# Patient Record
Sex: Female | Born: 1935 | Race: White | Hispanic: No | Smoking: Never smoker
Health system: Southern US, Community
[De-identification: ages and names within clinical notes are randomized; demographics above are authoritative.]

## PROBLEM LIST (undated history)

## (undated) DIAGNOSIS — G459 Transient cerebral ischemic attack, unspecified: Secondary | ICD-10-CM

## (undated) DIAGNOSIS — I499 Cardiac arrhythmia, unspecified: Secondary | ICD-10-CM

## (undated) DIAGNOSIS — A0471 Enterocolitis due to Clostridium difficile, recurrent: Secondary | ICD-10-CM

## (undated) DIAGNOSIS — I10 Essential (primary) hypertension: Secondary | ICD-10-CM

## (undated) DIAGNOSIS — I4891 Unspecified atrial fibrillation: Secondary | ICD-10-CM

## (undated) DIAGNOSIS — D696 Thrombocytopenia, unspecified: Secondary | ICD-10-CM

## (undated) DIAGNOSIS — I639 Cerebral infarction, unspecified: Secondary | ICD-10-CM

## (undated) DIAGNOSIS — E785 Hyperlipidemia, unspecified: Secondary | ICD-10-CM

## (undated) HISTORY — DX: Transient cerebral ischemic attack, unspecified: G45.9

## (undated) HISTORY — DX: Enterocolitis due to Clostridium difficile, recurrent: A04.71

## (undated) HISTORY — PX: OTHER SURGICAL HISTORY: SHX169

## (undated) HISTORY — DX: Thrombocytopenia, unspecified: D69.6

## (undated) HISTORY — DX: Essential (primary) hypertension: I10

## (undated) HISTORY — DX: Unspecified atrial fibrillation: I48.91

## (undated) HISTORY — DX: Cardiac arrhythmia, unspecified: I49.9

---

## 2001-07-20 ENCOUNTER — Ambulatory Visit (HOSPITAL_COMMUNITY): Admission: RE | Admit: 2001-07-20 | Discharge: 2001-07-20 | Payer: Self-pay | Admitting: Internal Medicine

## 2001-07-20 ENCOUNTER — Encounter: Payer: Self-pay | Admitting: Internal Medicine

## 2002-07-05 ENCOUNTER — Encounter: Payer: Self-pay | Admitting: Internal Medicine

## 2002-07-05 ENCOUNTER — Ambulatory Visit (HOSPITAL_COMMUNITY): Admission: RE | Admit: 2002-07-05 | Discharge: 2002-07-05 | Payer: Self-pay | Admitting: Internal Medicine

## 2004-07-31 ENCOUNTER — Ambulatory Visit (HOSPITAL_COMMUNITY): Admission: RE | Admit: 2004-07-31 | Discharge: 2004-07-31 | Payer: Self-pay | Admitting: Internal Medicine

## 2007-01-06 ENCOUNTER — Ambulatory Visit (HOSPITAL_COMMUNITY): Admission: RE | Admit: 2007-01-06 | Discharge: 2007-01-06 | Payer: Self-pay | Admitting: Ophthalmology

## 2007-05-29 ENCOUNTER — Ambulatory Visit (HOSPITAL_COMMUNITY): Admission: RE | Admit: 2007-05-29 | Discharge: 2007-05-29 | Payer: Self-pay | Admitting: Family Medicine

## 2010-07-27 ENCOUNTER — Inpatient Hospital Stay (HOSPITAL_COMMUNITY): Admission: EM | Admit: 2010-07-27 | Discharge: 2010-07-30 | Payer: Self-pay | Admitting: Emergency Medicine

## 2010-07-27 ENCOUNTER — Ambulatory Visit: Payer: Self-pay | Admitting: Orthopedic Surgery

## 2010-07-30 ENCOUNTER — Encounter (INDEPENDENT_AMBULATORY_CARE_PROVIDER_SITE_OTHER): Payer: Self-pay | Admitting: Internal Medicine

## 2010-07-30 ENCOUNTER — Encounter: Payer: Self-pay | Admitting: Orthopedic Surgery

## 2010-08-02 ENCOUNTER — Ambulatory Visit: Payer: Self-pay | Admitting: Orthopedic Surgery

## 2010-08-02 DIAGNOSIS — S52539A Colles' fracture of unspecified radius, initial encounter for closed fracture: Secondary | ICD-10-CM | POA: Insufficient documentation

## 2010-08-09 ENCOUNTER — Ambulatory Visit: Payer: Self-pay | Admitting: Orthopedic Surgery

## 2010-08-16 ENCOUNTER — Ambulatory Visit: Payer: Self-pay | Admitting: Orthopedic Surgery

## 2010-09-06 ENCOUNTER — Ambulatory Visit: Payer: Self-pay | Admitting: Orthopedic Surgery

## 2010-10-09 ENCOUNTER — Ambulatory Visit: Payer: Self-pay | Admitting: Orthopedic Surgery

## 2010-11-20 ENCOUNTER — Ambulatory Visit
Admission: RE | Admit: 2010-11-20 | Discharge: 2010-11-20 | Payer: Self-pay | Source: Home / Self Care | Attending: Orthopedic Surgery | Admitting: Orthopedic Surgery

## 2010-12-11 NOTE — Assessment & Plan Note (Signed)
Summary: 3 WK RE-CK LT WRIST/XRAY OOP/SEC HORIZ/CAF    Visit Type:  Follow-up Primary Provider:  Dr. Phillips Odor.  CC:  fx care left wrist.  History of Present Illness: DOI: 9.16.2011  Meds: Advil, ASA, HCTZ, Lasix, Tobramycin eye drops, Norco 5 for pain, alleyrgy med OTC, Tussin for cough.  Today is 3 week recheck xrays OOP.  Doing well.  This is a six-week followup status post fracture of the LEFT distal radius treated by short-arm cast  X-rays are obtained  3 views of the LEFT wrist  There is a fracture of the distal radius consistent with a Colles' type fracture.  Her radial inclination is good the length is neutral in terms of the ulna.  She does have slight dorsal tilt.  We accepted this position vs. surgery and the patient is aware.  Impression healing distal radius fracture with acceptable alignment  Clinically examination reveals tenderness at the wrist joint and somewhat over the distal radius as well.  She has pain with supination.  Recommend splint x4 weeks.  3 times a day exercises out of the splint  Return 4 weeks for x-ray.                Impression & Recommendations:  Problem # 1:  AFTERCARE HEALING TRAUMATIC FRACTURE LOWER ARM (ICD-V54.12) Assessment Improved  Orders: Post-Op Check (16109) Wrist x-ray complete, minimum 3 views (73110)  Problem # 2:  CLOSED COLLES FRACTURE (ICD-813.41) Assessment: Improved  Orders: Post-Op Check (60454)  Patient Instructions: 1)  Please schedule a follow-up appointment in 1 month. 2)  xrays wrist    Orders Added: 1)  Post-Op Check [99024] 2)  Wrist x-ray complete, minimum 3 views [73110]

## 2010-12-11 NOTE — Consult Note (Signed)
Summary: [facility] Consult FX  LT Wrist  Hosp Consult FX  LT Wrist   Imported By: Cammie Sickle 08/01/2010 15:33:28  _____________________________________________________________________  External Attachment:    Type:   Image     Comment:   External Document

## 2010-12-11 NOTE — Assessment & Plan Note (Signed)
Summary: 1 M RE-CK/XRAYS LT WRIST/SEC HORIZ/CAF   Visit Type:  Follow-up Primary Provider:  Dr. Phillips Odor.  CC:  fx care wrist.  History of Present Illness: DOI: 9.16.2011 left wrist fracture.    Meds: Advil, ASA, HCTZ, Lasix, Tobramycin eye drops, Norco 5 for pain as needed, alleyrgy med OTC, Tussin for cough.  Doing well.  Today is one month recheck with xrays after removable splint.  Doing pretty good.  X-rays today AP lateral oblique LEFT wrist show radial length has been maintained there is slight don't dorsal tilt at the articular surface.  Radial inclination angle is normal  Impression healed fracture with slight dorsal tilt  Patient is advised to continue with a home exercise program using a ball to squeeze working on range of motion and strength and come back in 6 weeks no x-ray needed                   Other Orders: Post-Op Check (16109) Wrist x-ray complete, minimum 3 views (60454)  Patient Instructions: 1)  Please schedule a follow-up appointment in 6 weeks   Orders Added: 1)  Post-Op Check [99024] 2)  Wrist x-ray complete, minimum 3 views [73110]

## 2010-12-11 NOTE — Assessment & Plan Note (Signed)
Summary: HOSP FOL/UP/LT WRIST FX,per Dr Harrison/?NEW XRAY/SEC HORIZ/CAF   Visit Type:  Follow-up  CC:  fu from hospital.  History of Present Illness: I saw Kathryn Marquez in the office today for a followup visit.  She is a 77 years old woman with the complaint of:  left wrist fracture after fall 07/27/10 after fainting.  Had closed reduction and splint on 07/27/10.  Today patient is here to be casted.  Meds: Advil, ASA, HCTZ, Lasix, Tobramycin eye drops, Norco 5 for pain.  Doing ok, pain med helps, her tail bone is hurting her more than her arm, cant sit on the table, too hard.  She has a donut cushion to sit on.  Followup from the hospital we will rex-ray the wrist.  3 views of the wrist show that the fracture has reasonable alignment with slight dorsal tilt.  The radial inclination angle is acceptable as is the radial length  Short arm cast applied x-ray in one week patient advised if the fracture moves out of position will require surgery     Impression & Recommendations:  Problem # 1:  CLOSED COLLES FRACTURE (ICD-813.41)  application short-arm cast  Orders: Post-Op Check (16109) Short Arm Cast Elbow to Finger (60454)  Patient Instructions: 1)  xray wrist in cast in 1 week

## 2010-12-11 NOTE — Assessment & Plan Note (Signed)
Summary: 1 WEEK XR WRIST IN CAST/SEC HOR/BSF   Visit Type:  Follow-up  CC:  recheck fx wrist post op.  History of Present Illness: I saw Katanya Pless in the office today for a followup visit.  She is a 77 years old woman with the complaint of:  left wrist fracture after fall 07/27/10 after fainting.  IN THE ER Had closed reduction and splint on 07/27/10. DAY # 13  Meds: Advil, ASA, HCTZ, Lasix, Tobramycin eye drops, Norco 5 for pain.  Today is one week recheck xrays in cast.  Doing well  Itching with pain medication but not with 1/2 a tablet   X-rays are to be done today to evaluate LEFT wrist fracture 13 days after initial injury and close reduction in the emergency room by the emergency room physician  X-rays show slight dorsal tilt normal to near normal radial inclination and radial length.  Impression healing distal radius fracture with slight dorsal tilt  Recommend x-ray out of plaster in one week.       Impression & Recommendations:  Problem # 1:  CLOSED COLLES FRACTURE (ICD-813.41) Assessment Comment Only  Orders: Post-Op Check (84132) Wrist x-ray complete, minimum 3 views (44010)  Patient Instructions: 1)  Please schedule a follow-up appointment in 1 week. 2)  XR OOP

## 2010-12-11 NOTE — Assessment & Plan Note (Signed)
Summary: 1 WK RE-CK/XRAY OOP LT WRIST/POST OP/SEC HORIZ/CAF   Visit Type:  Follow-up Primary Provider:  Dr. Phillips Odor.  CC:  fx care left wrist.  History of Present Illness: DOI: 9.16.2011  DAY # 20 Meds: Advil, ASA, HCTZ, Lasix, Tobramycin eye drops, Norco 5 for pain, alleyrgy med OTC, Tussin for cough.  Today is one week recheck xrays OOP.    Her alignment looks normal clinically  On x-ray 3 views were obtained and there is a slight dorsal tilt but her length is good and her radial inclination is good  Recommended short arm cast additional 3 weeks and x-rays out of plaster determine if we will need a splint or another cast.  3 views LEFT wrist  Distal radius fracture is seen.  There is slight dorsal tilt to the articular surface the length is normal and the radial inclination is normal  Impression healing LEFT distal radius fracture          Impression & Recommendations:  Problem # 1:  CLOSED COLLES FRACTURE (ICD-813.41) Assessment Improved  Orders: Post-Op Check (37628) Wrist x-ray complete, minimum 3 views (73110)  Problem # 2:  AFTERCARE HEALING TRAUMATIC FRACTURE LOWER ARM (ICD-V54.12) Assessment: Improved  Orders: Post-Op Check (31517) Wrist x-ray complete, minimum 3 views (73110)  Patient Instructions: 1)  XRAYS OOP IN 3 WEEKS

## 2010-12-13 NOTE — Assessment & Plan Note (Signed)
Summary: 6 WK RE-CK LT WRIST ROM/POST OP FX 07/27/10/SEC HORIZON/CAF   Visit Type:  Follow-up Primary Provider:  Dr. Phillips Odor.  CC:  recheck left wrist.  History of Present Illness: DOI: 9.16.2011 left wrist fracture.  Meds: Advil, ASA, HCTZ, Lasix, Tobramycin eye drops, Advil as needed, Tussin cough med as needed.  Today is 6 week recheck after hand  and wrist exercises.  Doing better.  Still has some stiffness of the wrist.  There is a fracture of the distal radius consistent with a Colles' type fracture.  Her radial inclination is good the length is neutral in terms of the ulna.  She does have slight dorsal tilt.  We accepted this position vs. surgery and the patient is aware.  The patient has functional range of motion at this time. No major deformity of the LEFT upper extremity. Some weakness in terms of grip and decreased supination, but otherwise is functioning very well with no pain at the fracture site. She is advised to continue her exercise program. Followup with [place] as needed              Impression & Recommendations:  Problem # 1:  AFTERCARE HEALING TRAUMATIC FRACTURE LOWER ARM (ICD-V54.12) Assessment Improved  Orders: Est. Patient Level II (04540)  Problem # 2:  CLOSED COLLES FRACTURE (ICD-813.41) Assessment: Improved  Orders: Est. Patient Level II (98119)  Patient Instructions: 1)  Please schedule a follow-up appointment as needed.   Orders Added: 1)  Est. Patient Level II [14782]

## 2011-01-24 LAB — GLUCOSE, CAPILLARY
Glucose-Capillary: 110 mg/dL — ABNORMAL HIGH (ref 70–99)
Glucose-Capillary: 132 mg/dL — ABNORMAL HIGH (ref 70–99)
Glucose-Capillary: 153 mg/dL — ABNORMAL HIGH (ref 70–99)

## 2011-01-24 LAB — POCT CARDIAC MARKERS
CKMB, poc: 1 ng/mL (ref 1.0–8.0)
Myoglobin, poc: 175 ng/mL (ref 12–200)
Troponin i, poc: <0.05 ng/mL (ref 0.00–0.09)

## 2011-01-24 LAB — HEPATIC FUNCTION PANEL
Albumin: 3.8 g/dL (ref 3.5–5.2)
Bilirubin, Direct: 0.1 mg/dL (ref 0.0–0.3)
Total Bilirubin: 0.6 mg/dL (ref 0.3–1.2)

## 2011-01-24 LAB — LIPID PANEL
Cholesterol: 192 mg/dL (ref 0–200)
Total CHOL/HDL Ratio: 3 RATIO
VLDL: 23 mg/dL (ref 0–40)

## 2011-01-24 LAB — URINE CULTURE: Colony Count: 1000

## 2011-01-24 LAB — URINALYSIS, ROUTINE W REFLEX MICROSCOPIC
Protein, ur: NEGATIVE mg/dL
Specific Gravity, Urine: 1.01 (ref 1.005–1.030)
Urobilinogen, UA: 0.2 mg/dL (ref 0.0–1.0)

## 2011-01-24 LAB — BASIC METABOLIC PANEL
Calcium: 9.3 mg/dL (ref 8.4–10.5)
GFR calc Af Amer: >60 mL/min (ref >60)
GFR calc non Af Amer: >60 mL/min (ref >60)
Glucose, Bld: 191 mg/dL — ABNORMAL HIGH (ref 70–99)
Potassium: 4 mEq/L (ref 3.5–5.1)
Sodium: 139 mEq/L (ref 135–145)

## 2011-01-24 LAB — CBC
HCT: 40.2 % (ref 36.0–46.0)
Hemoglobin: 13.7 g/dL (ref 12.0–15.0)
MCHC: 34.2 g/dL (ref 30.0–36.0)
RBC: 4.64 MIL/uL (ref 3.87–5.11)
WBC: 7.7 10*3/uL (ref 4.0–10.5)

## 2011-01-24 LAB — DIFFERENTIAL
Basophils Relative: 0 % (ref 0–1)
Lymphocytes Relative: 18 % (ref 12–46)
Monocytes Absolute: 0.6 10*3/uL (ref 0.1–1.0)
Monocytes Relative: 7 % (ref 3–12)
Neutro Abs: 5.7 10*3/uL (ref 1.7–7.7)
Neutrophils Relative %: 74 % (ref 43–77)

## 2011-01-24 LAB — HEMOGLOBIN A1C
Hgb A1c MFr Bld: 5.8 % — ABNORMAL HIGH (ref <5.7)
Mean Plasma Glucose: 120 mg/dL — ABNORMAL HIGH (ref <117)

## 2011-01-24 LAB — URINE MICROSCOPIC-ADD ON

## 2011-01-24 LAB — MAGNESIUM: Magnesium: 2 mg/dL (ref 1.5–2.5)

## 2012-04-28 ENCOUNTER — Emergency Department (HOSPITAL_COMMUNITY): Payer: Medicare Other

## 2012-04-28 ENCOUNTER — Encounter (HOSPITAL_COMMUNITY): Payer: Self-pay | Admitting: Emergency Medicine

## 2012-04-28 ENCOUNTER — Inpatient Hospital Stay (HOSPITAL_COMMUNITY)
Admission: EM | Admit: 2012-04-28 | Discharge: 2012-04-30 | DRG: 065 | Disposition: A | Discharge status: Home / Self Care | Payer: Medicare Other | Attending: Internal Medicine | Admitting: Internal Medicine

## 2012-04-28 DIAGNOSIS — Z7982 Long term (current) use of aspirin: Secondary | ICD-10-CM

## 2012-04-28 DIAGNOSIS — I4891 Unspecified atrial fibrillation: Secondary | ICD-10-CM | POA: Diagnosis present

## 2012-04-28 DIAGNOSIS — Z66 Do not resuscitate: Secondary | ICD-10-CM | POA: Diagnosis present

## 2012-04-28 DIAGNOSIS — I482 Chronic atrial fibrillation, unspecified: Secondary | ICD-10-CM | POA: Diagnosis present

## 2012-04-28 DIAGNOSIS — G459 Transient cerebral ischemic attack, unspecified: Secondary | ICD-10-CM | POA: Diagnosis present

## 2012-04-28 DIAGNOSIS — G8190 Hemiplegia, unspecified affecting unspecified side: Secondary | ICD-10-CM | POA: Diagnosis present

## 2012-04-28 DIAGNOSIS — E785 Hyperlipidemia, unspecified: Secondary | ICD-10-CM | POA: Diagnosis present

## 2012-04-28 DIAGNOSIS — I639 Cerebral infarction, unspecified: Secondary | ICD-10-CM

## 2012-04-28 DIAGNOSIS — S52539A Colles' fracture of unspecified radius, initial encounter for closed fracture: Secondary | ICD-10-CM

## 2012-04-28 DIAGNOSIS — I6340 Cerebral infarction due to embolism of unspecified cerebral artery: Principal | ICD-10-CM | POA: Diagnosis present

## 2012-04-28 DIAGNOSIS — Z79899 Other long term (current) drug therapy: Secondary | ICD-10-CM

## 2012-04-28 DIAGNOSIS — I10 Essential (primary) hypertension: Secondary | ICD-10-CM | POA: Diagnosis present

## 2012-04-28 DIAGNOSIS — Z8673 Personal history of transient ischemic attack (TIA), and cerebral infarction without residual deficits: Secondary | ICD-10-CM | POA: Diagnosis present

## 2012-04-28 DIAGNOSIS — I480 Paroxysmal atrial fibrillation: Secondary | ICD-10-CM

## 2012-04-28 HISTORY — DX: Hyperlipidemia, unspecified: E78.5

## 2012-04-28 HISTORY — DX: Cerebral infarction, unspecified: I63.9

## 2012-04-28 HISTORY — DX: Essential (primary) hypertension: I10

## 2012-04-28 LAB — URINALYSIS, ROUTINE W REFLEX MICROSCOPIC
Nitrite: NEGATIVE
Specific Gravity, Urine: <1.005 — ABNORMAL LOW (ref 1.005–1.030)
Urobilinogen, UA: 0.2 mg/dL (ref 0.0–1.0)

## 2012-04-28 LAB — URINE MICROSCOPIC-ADD ON

## 2012-04-28 MED ORDER — ASPIRIN 81 MG PO CHEW
324.0000 mg | CHEWABLE_TABLET | Freq: Once | ORAL | Status: AC
Start: 2012-04-28 — End: 2012-04-28
  Administered 2012-04-28: 324 mg via ORAL
  Filled 2012-04-28: qty 4

## 2012-04-28 MED ORDER — SODIUM CHLORIDE 0.9 % IV SOLN
INTRAVENOUS | Status: DC
  Administered 2012-04-29: 01:00:00 via INTRAVENOUS

## 2012-04-28 NOTE — ED Notes (Signed)
Patient complaining of right arm weakness since 0900 today. Patient does have weakness in right hand with grips. Son states she was slightly confused this morning.

## 2012-04-28 NOTE — ED Provider Notes (Addendum)
History   This chart was scribed for Shelda Jakes, MD by Shari Heritage. The patient was seen in room APA19/APA19. Patient's care was started at 2127.     CSN: [id number]  Arrival date & time 04/28/12  2127   First MD Initiated Contact with Patient 04/28/12 2148      Chief Complaint  Patient presents with  . Extremity Weakness    (Consider location/radiation/quality/duration/timing/severity/associated sxs/prior treatment) Patient is a 78 y.o. female presenting with extremity weakness. The history is provided by the patient and a relative. No language interpreter was used.  Extremity Weakness This is a new problem. The current episode started 12 to 24 hours ago. The problem occurs rarely. The problem has been gradually improving. Pertinent negatives include no chest pain, no abdominal pain, no headaches and no shortness of breath. Nothing relieves the symptoms. She has tried nothing for the symptoms.   Kathryn Marquez is a 78 y.o. female who presents to the Emergency Department complaining of numbness in right hand onset 16 hours ago when she woke up. Patient says she was unable to pick anything up with her right hand and it felt like "her arm wasn't there." Patient says that now she is experiencing numbness and tingling in her right fingers. Patient says that she hasn't had problems like this before. Patient said she didn't notice any symptoms yesterday or immediately before she went to bed. Patient says that her left arm and hand feel normal. Patient is also experiencing congestion and hoarseness of voice. Patient denies phasia, HA, fever, sore throat, cough, SOB, chest pain, abdominal pain, nausea, vomiting, diarrhea, dysuria, neck pain, back pain and rash. Patient is not on Coumadin, but takes Aspirin 1x/day. Patient with h/o HTN and irregular heart beat, a-fib.  PCP - Sherwood Gambler Past Medical History  Diagnosis Date  . Hypertension     History reviewed. No pertinent past surgical  history.  History reviewed. No pertinent family history.  History  Substance Use Topics  . Smoking status: Never Smoker   . Smokeless tobacco: Not on file  . Alcohol Use: No    OB History    Grav Para Term Preterm Abortions TAB SAB Ect Mult Living                  Review of Systems  Constitutional: Negative for fever and chills.  HENT: Positive for congestion and sore throat. Negative for neck pain.   Eyes: Negative for visual disturbance.  Respiratory: Negative for cough and shortness of breath.   Cardiovascular: Negative for chest pain.  Gastrointestinal: Negative for nausea, vomiting, abdominal pain and diarrhea.  Genitourinary: Negative for dysuria.  Musculoskeletal: Positive for extremity weakness. Negative for back pain.  Skin: Negative for rash.  Neurological: Positive for weakness and numbness. Negative for speech difficulty and headaches.   Patient is positive for numbness, weakness, tingling, congestion, hoarseness of voice.  Patient is negative for aphasia, HA, fever, sore throat, cough, SOB, chest pain, abdominal pain, nausea, vomiting, diarrhea, dysuria, neck pain, back pain and rash.  Allergies  Review of patient's allergies indicates no known allergies.  Home Medications   Current Outpatient Rx  Name Route Sig Dispense Refill  . AMLODIPINE BESYLATE 5 MG PO TABS Oral Take 5 mg by mouth every morning.    . ASPIRIN 81 MG PO CHEW Oral Chew 81 mg by mouth every morning.    Marland Kitchen HYDROCHLOROTHIAZIDE 25 MG PO TABS Oral Take 25 mg by mouth daily as needed. For fluid    .  IBUPROFEN 200 MG PO TABS Oral Take 200 mg by mouth 2 (two) times daily as needed.      BP 163/76  Pulse 83  Temp 98.9 F (37.2 C) (Oral)  Resp 18  Ht 5\' 5"  (1.651 m)  Wt 160 lb (72.576 kg)  BMI 26.63 kg/m2  SpO2 99%  Physical Exam  Nursing note and vitals reviewed. Constitutional: She is oriented to person, place, and time. She appears well-developed and well-nourished.  HENT:  Head:  Normocephalic and atraumatic.  Eyes: Conjunctivae and EOM are normal. Pupils are equal, round, and reactive to light.       Pupils are 3 mm. Sclera is clear.  Neck: Normal range of motion. Neck supple. Carotid bruit is not present.  Cardiovascular: Regular rhythm.  Tachycardia present.   No murmur heard. Pulses:      Radial pulses are 2+ on the right side.  Pulmonary/Chest: Effort normal and breath sounds normal. No respiratory distress. She has no wheezes. She has no rales.  Abdominal: Soft. Bowel sounds are normal.  Musculoskeletal: Normal range of motion. She exhibits edema (Non-pitting of ankles bilaterally.).       Right ankle: She exhibits swelling.       Left ankle: She exhibits swelling.  Lymphadenopathy:    She has no cervical adenopathy.  Neurological: She is alert and oriented to person, place, and time.       Weakness going thumb to middle finger and grip.  Skin: Skin is warm and dry.  Psychiatric: She has a normal mood and affect.    ED Course  Procedures (including critical care time) DIAGNOSTIC STUDIES: Oxygen Saturation is 97% on room air, adequate by my interpretation.    COORDINATION OF CARE: 10:30PM- Patient informed of current plan for treatment and evaluation and agrees with plan at this time. Patient has a-fib. Will order chest X-ray and CT scan of brain.  Labs Reviewed  URINALYSIS, ROUTINE W REFLEX MICROSCOPIC - Abnormal; Notable for the following:    Color, Urine STRAW (*)     Specific Gravity, Urine <1.005 (*)     Leukocytes, UA TRACE (*)     All other components within normal limits  CBC  DIFFERENTIAL  PROTIME-INR  URINE MICROSCOPIC-ADD ON  COMPREHENSIVE METABOLIC PANEL      Dg Chest 2 View  04/28/2012  *RADIOLOGY REPORT*  Clinical Data: Right arm weakness; confusion.  CHEST - 2 VIEW  Comparison: Chest radiograph performed 07/27/2010  Findings: There is partial left-sided volume loss, with associated postoperative changes as seen on the prior  study.  Adjacent opacity appears grossly stable from prior studies, without definite evidence of acute airspace consolidation.  The right lung remains clear.  No pleural effusion or pneumothorax is seen.  The lung apices are incompletely imaged on this study. Minimal nodularity at the left midlung zone appears stable from 2008.  The heart is borderline enlarged; the mediastinal contour is within normal limits.  No acute osseous abnormalities are seen.  IMPRESSION: Left-sided postoperative changes and associated chronic opacification are stable, without definite evidence for superimposed airspace consolidation.  Borderline cardiomegaly.  Original Report Authenticated By: Tonia Ghent, M.D.   Ct Head Wo Contrast  04/28/2012  *RADIOLOGY REPORT*  Clinical Data: Right arm weakness and mild confusion.  CT HEAD WITHOUT CONTRAST  Technique:  Contiguous axial images were obtained from the base of the skull through the vertex without contrast.  Comparison: CT of the head, and MRI/MRA of the brain performed 07/27/2010  Findings: There  is no evidence of acute infarction, mass lesion, or intra- or extra-axial hemorrhage on CT.  Prominence of the ventricles and sulci reflects mild cortical volume loss.  Mild cerebellar atrophy is noted.  Mild periventricular white matter change likely reflects small vessel ischemic microangiopathy.  The brainstem and fourth ventricle are within normal limits.  The basal ganglia are unremarkable in appearance.  The cerebral hemispheres demonstrate grossly normal gray-white differentiation. No mass effect or midline shift is seen.  There is no evidence of fracture; visualized osseous structures are unremarkable in appearance.  The orbits are within normal limits. Mild mucosal thickening is noted within the maxillary sinuses; the remaining paranasal sinuses and mastoid air cells are well-aerated. No significant soft tissue abnormalities are seen.  IMPRESSION:  1.  No acute intracranial pathology  seen on CT. 2.  Mild cortical volume loss and scattered small vessel ischemic microangiopathy. 3.  Mild mucosal thickening in the maxillary sinuses.  Original Report Authenticated By: Tonia Ghent, M.D.    Date: 04/29/2012  Rate: 129  Rhythm: atrial fibrillation  QRS Axis: normal  Intervals: normal  ST/T Wave abnormalities: nonspecific ST/T changes  Conduction Disutrbances:none  Narrative Interpretation:   Old EKG Reviewed: changes noted Changes noted but the last EKG was 07/27/2010 no evidence of atrial fibrillation on that.   1. CVA (cerebral infarction)   2. Atrial fibrillation with rapid ventricular response       MDM   Patient awoke at 6:30 in the morning with right hand weakness and numbness proved a little bit throughout the day but hasn't resolved family members also relates that she also seemed more confused and may have had some speech problems first thing in the morning. CT of the head is negative for bleed or infarct is still concerning for a stroke, CVA. Patient noted to be in atrial fibrillation with talking with family members sounds as if she may of been in atrial fib before she is not on Coumadin but she is on aspirin 81 mg chewable a day. Patient will require admission pneumonia admission to telemetry because of atrial fib initially her heart rate was 132 monitor for now heart rates in the 80s K. Alona Bene relative on and off EKG also showed atrophic. Urinalysis is negative complete blood count is negative electrolytes are still pending. Discussed with hospitalist they will admit to telemetry temporary admit orders completed for them. On exam here patient is still showing some problems with the right hand are some trouble with grip strength trouble with coordination of the hand no other focal neurological findings. Once head CT was negative patient was given a 324 mg of aspirin in the emergency department. Hospitalist aware that due to the atrial fib that the CVA may be related  to that patient does not have any carotid bruits. Will require echocardiogram of the heart to rule out any blood clots.    I personally performed the services described in this documentation, which was scribed in my presence. The recorded information has been reviewed and considered.     Shelda Jakes, MD 04/29/12 [id number]  Shelda Jakes, MD 04/29/12 [phone]

## 2012-04-29 ENCOUNTER — Encounter (HOSPITAL_COMMUNITY): Payer: Self-pay | Admitting: *Deleted

## 2012-04-29 ENCOUNTER — Observation Stay (HOSPITAL_COMMUNITY): Payer: Medicare Other

## 2012-04-29 DIAGNOSIS — I639 Cerebral infarction, unspecified: Secondary | ICD-10-CM

## 2012-04-29 DIAGNOSIS — I482 Chronic atrial fibrillation, unspecified: Secondary | ICD-10-CM | POA: Diagnosis present

## 2012-04-29 DIAGNOSIS — I10 Essential (primary) hypertension: Secondary | ICD-10-CM | POA: Diagnosis present

## 2012-04-29 DIAGNOSIS — I4891 Unspecified atrial fibrillation: Secondary | ICD-10-CM

## 2012-04-29 DIAGNOSIS — G459 Transient cerebral ischemic attack, unspecified: Secondary | ICD-10-CM

## 2012-04-29 DIAGNOSIS — Z8673 Personal history of transient ischemic attack (TIA), and cerebral infarction without residual deficits: Secondary | ICD-10-CM

## 2012-04-29 HISTORY — DX: Personal history of transient ischemic attack (TIA), and cerebral infarction without residual deficits: Z86.73

## 2012-04-29 HISTORY — DX: Cerebral infarction, unspecified: I63.9

## 2012-04-29 LAB — DIFFERENTIAL
Basophils Relative: 0 % (ref 0–1)
Eosinophils Absolute: 0.1 10*3/uL (ref 0.0–0.7)
Monocytes Absolute: 0.6 10*3/uL (ref 0.1–1.0)
Monocytes Relative: 9 % (ref 3–12)
Neutrophils Relative %: 65 % (ref 43–77)

## 2012-04-29 LAB — COMPREHENSIVE METABOLIC PANEL
Albumin: 3.7 g/dL (ref 3.5–5.2)
BUN: 13 mg/dL (ref 6–23)
Calcium: 9.7 mg/dL (ref 8.4–10.5)
Creatinine, Ser: 0.59 mg/dL (ref 0.50–1.10)
Potassium: 3.7 mEq/L (ref 3.5–5.1)
Total Protein: 6.8 g/dL (ref 6.0–8.3)

## 2012-04-29 LAB — CBC
Hemoglobin: 13.6 g/dL (ref 12.0–15.0)
MCH: 29.6 pg (ref 26.0–34.0)
MCHC: 33 g/dL (ref 30.0–36.0)
MCHC: 33.3 g/dL (ref 30.0–36.0)
Platelets: 183 10*3/uL (ref 150–400)
RDW: 13.1 % (ref 11.5–15.5)
WBC: 6.9 10*3/uL (ref 4.0–10.5)

## 2012-04-29 LAB — CREATININE, SERUM
Creatinine, Ser: 0.53 mg/dL (ref 0.50–1.10)
GFR calc Af Amer: >90 mL/min (ref >90)
GFR calc non Af Amer: 82 mL/min — ABNORMAL LOW (ref >90)

## 2012-04-29 LAB — PROTIME-INR
INR: 1 (ref 0.00–1.49)
Prothrombin Time: 13.4 seconds (ref 11.6–15.2)

## 2012-04-29 LAB — LIPID PANEL
HDL: 74 mg/dL (ref >39)
LDL Cholesterol: 103 mg/dL — ABNORMAL HIGH (ref 0–99)
Triglycerides: 109 mg/dL (ref <150)
VLDL: 22 mg/dL (ref 0–40)

## 2012-04-29 LAB — TSH: TSH: 1.947 u[IU]/mL (ref 0.350–4.500)

## 2012-04-29 MED ORDER — SODIUM CHLORIDE 0.9 % IV SOLN: INTRAVENOUS | Status: AC

## 2012-04-29 MED ORDER — IBUPROFEN 400 MG PO TABS: 200.0000 mg | ORAL_TABLET | Freq: Two times a day (BID) | ORAL | Status: DC | PRN

## 2012-04-29 MED ORDER — SODIUM CHLORIDE 0.9 % IJ SOLN
INTRAMUSCULAR | Status: AC
  Filled 2012-04-29: qty 3

## 2012-04-29 MED ORDER — ALPRAZOLAM 0.5 MG PO TABS
0.5000 mg | ORAL_TABLET | Freq: Once | ORAL | Status: AC | PRN
  Administered 2012-04-29: 0.5 mg via ORAL
  Filled 2012-04-29: qty 1

## 2012-04-29 MED ORDER — ENOXAPARIN SODIUM 40 MG/0.4ML ~~LOC~~ SOLN
40.0000 mg | SUBCUTANEOUS | Status: DC
  Administered 2012-04-29 – 2012-04-30 (×2): 40 mg via SUBCUTANEOUS
  Filled 2012-04-29 (×2): qty 0.4

## 2012-04-29 MED ORDER — ACETAMINOPHEN 325 MG PO TABS: 650.0000 mg | ORAL_TABLET | ORAL | Status: DC | PRN

## 2012-04-29 MED ORDER — POTASSIUM CHLORIDE CRYS ER 20 MEQ PO TBCR
20.0000 meq | EXTENDED_RELEASE_TABLET | Freq: Two times a day (BID) | ORAL | Status: AC
  Administered 2012-04-29 (×2): 20 meq via ORAL
  Filled 2012-04-29 (×2): qty 1

## 2012-04-29 MED ORDER — ASPIRIN 325 MG PO TABS
325.0000 mg | ORAL_TABLET | Freq: Every day | ORAL | Status: DC
  Administered 2012-04-29 – 2012-04-30 (×2): 325 mg via ORAL
  Filled 2012-04-29 (×2): qty 1

## 2012-04-29 MED ORDER — HYDROCHLOROTHIAZIDE 25 MG PO TABS
25.0000 mg | ORAL_TABLET | Freq: Every day | ORAL | Status: DC
  Administered 2012-04-29: 25 mg via ORAL
  Filled 2012-04-29: qty 1

## 2012-04-29 MED ORDER — METOPROLOL SUCCINATE ER 25 MG PO TB24
25.0000 mg | ORAL_TABLET | Freq: Every day | ORAL | Status: DC
  Administered 2012-04-29 – 2012-04-30 (×2): 25 mg via ORAL
  Filled 2012-04-29 (×2): qty 1

## 2012-04-29 MED ORDER — SODIUM CHLORIDE 0.9 % IV SOLN: 250.0000 mL | INTRAVENOUS | Status: DC | PRN

## 2012-04-29 MED ORDER — SODIUM CHLORIDE 0.9 % IJ SOLN
INTRAMUSCULAR | Status: AC
  Administered 2012-04-29: 05:00:00
  Filled 2012-04-29: qty 3

## 2012-04-29 NOTE — Progress Notes (Signed)
Occupational Therapy Evaluation Patient Details Name: DESHAWNDA ACREY MRN: [id number] DOB: February 01, 1921 Today's Date: 04/29/2012 Time: 8295-6213 OT Time Calculation (min): 30 min  OT Assessment / Plan / Recommendation Clinical Impression  A: 78 y/o female who presents with weakness, decreased coordination and sensation loss in her RUE. These deficits are affecting her ability to perform novel tasks, including grooming with her dominant RUE. Chart review states possible TIA but waiting on MRI to confirm. OT referred for evaluation and treatment.  Pt could benefit from continued OT services to address weakness, decreased coordination and sensation. Recommending outpatient OT upon discharge for continued OT services addressing those deficits.      OT Assessment  Patient needs continued OT Services    Follow Up Recommendations  Outpatient OT    Barriers to Discharge None          Frequency  Min 3X/week           ADL  Grooming: Performed Where Assessed - Grooming: Supine, head of bed up ADL Comments: Pt washed her face and brushed her teeth while supine in bed with HOB up. She was able to use RUE as gross assist to LUE while washing face. She initially attempted to brush her teeth with the toothbrush in her right hand but had difficulty with coordination of movement and switched toothbrush to her left hand. to complete the task.    OT Diagnosis: Other (comment) (Decreased strength & fine motor coordination, dominant side)  OT Problem List: Decreased strength;Decreased coordination;Impaired UE functional use;Decreased activity tolerance OT Treatment Interventions: Self-care/ADL training;Neuromuscular education;Therapeutic activities;Therapeutic exercise;Patient/family education   OT Goals Acute Rehab OT Goals OT Goal Formulation: With patient/family ADL Goals Pt Will Perform Grooming: Independently (using RUE) ADL Goal: Grooming - Progress: Goal set today Miscellaneous OT  Goals Miscellaneous OT Goal #1: Patient will perform all aspects of toileting (transfer, clothing maintenance and hygiene) independently. Miscellaneous OT Goal #2: Patient will improve RUE strength to 5/5 to increase independence with cooking and baking.  Miscellaneous OT Goal #3: Patient will demonstrate good coordination of RUE during grooming tasks.  Visit Information  Last OT Received On: 04/29/12    Subjective Data  Subjective: S: My right arm is getting better, but it still isnt doing as well as my left arm.  Patient Stated Goal: To get her L arm back to 100%.   Prior Functioning  Home Living Lives With: Alone Available Help at Discharge: Family Type of Home: House Home Access: Stairs to enter [employer] of Steps: 1 step Entrance Stairs-Rails: None Home Layout: Two level;Laundry or work area in basement Alternate Level Stairs-Rails: None Bathroom Shower/Tub: Associate Professor: Yes How Accessible: Accessible via walker Home Adaptive Equipment: Straight cane (has first alert) Prior Function Level of Independence: Independent Able to Take Stairs?: Yes Driving: Yes Vocation: Retired Comments: enjoys cooking for her family and is quite independent with all daily activities.  She recently renewed her drivers license and was able to do so without any restrictions. Communication Communication: No difficulties Dominant Hand: Right    Cognition  Overall Cognitive Status: Appears within functional limits for tasks assessed/performed Arousal/Alertness: Awake/alert Orientation Level: Appears intact for tasks assessed Behavior During Session: [facility] for tasks performed    Extremity/Trunk Assessment Right Upper Extremity Assessment RUE ROM/Strength/Tone: Deficits RUE ROM/Strength/Tone Deficits: RUE ROM WFL. RUE strength grade 4/5 in all movements.  RUE Sensation: Deficits RUE Sensation Deficits: Pt reports decreased  sensation in RUE compared to L when testing  light touch. Pt stated the touch on her RUE felt much duller/softer. RUE Coordination: Deficits RUE Coordination Deficits: Gross motor coordination in tact. Fine motor coordination impaired. Patient was unable to coordinate movements to perform the finger tips to thumb exercise. She required increase time and mod pa to touch her index, middle, third and small finger to her thumb. Patient was able to independently and accurately complete this activity with her LUE.    Mobility Bed Mobility Bed Mobility: Supine to Sit Supine to Sit: 7: Independent Details for Bed Mobility Assistance: Patient performed supine to sit (with HOB raised approx 45 degrees) independently.      Balance Balance Balance Assessed: No  End of Session OT - End of Session Activity Tolerance: Patient tolerated treatment well Patient left: in bed;with call bell/phone within reach;with family/visitor present;Other (comment) (being transferred to MRI)   Laverta Baltimore, OTS Occupational Therapy Student Note reviewed by clinical instructor and accurately reflects treatment session.  Shirlean Mylar, OTR/L  04/29/2012, 10:30 AM

## 2012-04-29 NOTE — Plan of Care (Signed)
Problem: Phase I Progression Outcomes Goal: OOB as tolerated unless otherwise ordered Outcome: Progressing Up to [facility] with assistance

## 2012-04-29 NOTE — Evaluation (Signed)
Physical Therapy Evaluation Patient Details Name: Kathryn Marquez MRN: [id number] DOB: 1921-06-12 Today's Date: 04/29/2012 Time: 7846-9629 PT Time Calculation (min): 36 min  PT Assessment / Plan / Recommendation Clinical Impression  Pt is very fatigued from activities of the day, but pleasant and cooperative.  Her primary dysfunction from stroke seems to be limited to her RUE, for which OT evaluated earlier.  She has DJD of the R hip and limitation of high level balance acuity (for which this is probably her norm).  She has been ambulating with a cane PTA, but I would like to see her use a walker, at least for now.  Her son is going to check their home to see if they already have a walker for her to use.  I would recommend HHPT at d/c for a ahome safety eval.              PT Assessment  All further PT needs can be met in the next venue of care    Follow Up Recommendations  Home health PT    Barriers to Discharge        lEquipment Recommendations  Rolling walker with 5" wheels (unless family already has a waker at home)    Recommendations for Other Services     Frequency      Precautions / Restrictions Precautions Precautions: Fall Restrictions Weight Bearing Restrictions: No   Pertinent Vitals/Pain       Mobility  Bed Mobility Bed Mobility: Supine to Sit;Sit to Supine Supine to Sit: 7: Independent Sit to Supine: 7: Independent Transfers Transfers: Sit to Stand;Stand to Sit Sit to Stand: 6: Modified independent (Device/Increase time);With upper extremity assist Stand to Sit: 6: Modified independent (Device/Increase time);With upper extremity assist Ambulation/Gait Ambulation/Gait Assistance: 5: Supervision Ambulation Distance (Feet): 150 Feet Assistive device: Rolling walker;Straight cane Ambulation/Gait Assistance Details: reaches for my hand when using a cane...she is more stable with a walker as she has DJD of R hip with hip in external rotation during gait and some  antalgia Gait Pattern: Step-through pattern;Trunk flexed Gait velocity: WNL Stairs: No Wheelchair Mobility Wheelchair Mobility: No    Exercises     PT Diagnosis: Difficulty walking  PT Problem List: Other (comment) (uncertainty of home safety) PT Treatment Interventions:     PT Goals    Visit Information  Last PT Received On: 04/29/12    Subjective Data  Subjective: feels "tired" Patient Stated Goal: return home   Prior Functioning       Cognition       Extremity/Trunk Assessment Right Lower Extremity Assessment RLE ROM/Strength/Tone: Within functional levels RLE Sensation: WFL - Light Touch;WFL - Proprioception RLE Coordination: WFL - gross motor Left Lower Extremity Assessment LLE ROM/Strength/Tone: Within functional levels LLE Sensation: WFL - Light Touch;WFL - Proprioception LLE Coordination: WFL - gross motor Trunk Assessment Trunk Assessment: Kyphotic   Balance Balance Balance Assessed: Yes Static Sitting Balance Static Sitting - Level of Assistance: 7: Independent Dynamic Sitting Balance Dynamic Sitting - Level of Assistance: 7: Independent Static Standing Balance Static Standing - Level of Assistance: 4: Min assist (unable to do higher level balance activities)  End of Session PT - End of Session Equipment Utilized During Treatment: Gait belt Activity Tolerance: Patient tolerated treatment well Patient left: in bed;with call bell/phone within reach;with bed alarm set;with family/visitor present Nurse Communication: Mobility status   Konrad Penta 04/29/2012, 4:15 PM

## 2012-04-29 NOTE — Care Management Note (Signed)
    Page 1 of 1   04/30/2012     2:39:38 PM   CARE MANAGEMENT NOTE 04/30/2012  Patient:  Kathryn Marquez, Kathryn Marquez   Account Number:  [id number]  Date Initiated:  04/29/2012  Documentation initiated by:  Rosemary Holms  Subjective/Objective Assessment:   Pt admitted from home alone with CVA/TIA symptoms.     Action/Plan:   Spoke to pt and daughter at bedside. Previously client of [facility].Marland Kitchen PCP is [place]. Will follow to assess DC needs   Anticipated DC Date:  04/30/2012   Anticipated DC Plan:  HOME W HOME HEALTH SERVICES      DC Planning Services  CM consult      Choice offered to / List presented to:     DME arranged  WALKER - Lavone Nian      DME agency  [facility]     [facility] arranged  HH-3 OT  HH-2 PT      [facility] agency  [facility]   Status of service:  Completed, signed off Medicare Important Message given?   (If response is "NO", the following Medicare IM given date fields will be blank) Date Medicare IM given:   Date Additional Medicare IM given:    Discharge Disposition:  HOME W HOME HEALTH SERVICES  Per UR Regulation:    If discussed at Long Length of Stay Meetings, dates discussed:    Comments:  04/30/12 1100 Eri Mcevers Leanord Hawking RN BSN CM [facility] notified for [facility] PT and OT. [facility] will also deliver rolling walker to home.  04/29/12 1400 Zanyiah Posten Leanord Hawking RN BSN CM

## 2012-04-29 NOTE — Progress Notes (Signed)
*  PRELIMINARY RESULTS* Echocardiogram 2D Echocardiogram has been performed.  Kathryn Marquez 04/29/2012, 2:31 PM

## 2012-04-29 NOTE — Progress Notes (Signed)
Occupational Therapy Evaluation Patient Details Name: Kathryn Marquez MRN: [id number] DOB: Dec 10, 1920 Today's Date: 04/29/2012 Time: 4098-1191 OT Time Calculation (min): 30 min  OT Assessment / Plan / Recommendation Clinical Impression  A: 78 y/o female who presents with weakness, decreased coordination and sensation loss in her RUE. These deficits are affecting her ability to perform novel tasks, including grooming with her dominant RUE. Chart review states possible TIA but waiting on MRI to confirm. OT referred for evaluation and treatment.  Pt could benefit from continued OT services to address weakness, decreased coordination and sensation. Recommending outpatient OT upon discharge for continued OT services addressing those deficits.      OT Assessment  Patient needs continued OT Services    Follow Up Recommendations  Outpatient OT    Barriers to Discharge None          Frequency  Min 3X/week           ADL  Grooming: Performed Where Assessed - Grooming: Supine, head of bed up ADL Comments: Pt washed her face and brushed her teeth while supine in bed with HOB up. She was able to use RUE as gross assist to LUE while washing face. She initially attempted to brush her teeth with the toothbrush in her right hand but had difficulty with coordination of movement and switched toothbrush to her left hand. to complete the task.    OT Diagnosis: Other (comment) (Decreased strength & fine motor coordination, dominant side)  OT Problem List: Decreased strength;Decreased coordination;Impaired UE functional use;Decreased activity tolerance OT Treatment Interventions: Self-care/ADL training;Neuromuscular education;Therapeutic activities;Therapeutic exercise;Patient/family education   OT Goals Acute Rehab OT Goals OT Goal Formulation: With patient/family ADL Goals Pt Will Perform Grooming: Independently (using RUE) ADL Goal: Grooming - Progress: Goal set today Miscellaneous OT  Goals Miscellaneous OT Goal #1: Patient will perform all aspects of toileting (transfer, clothing maintenance and hygiene) independently. Miscellaneous OT Goal #2: Patient will improve RUE strength to 5/5 to increase independence with cooking and baking.  Miscellaneous OT Goal #3: Patient will demonstrate good coordination of RUE during grooming tasks.  Visit Information  Last OT Received On: 04/29/12    Subjective Data  Subjective: S: My right arm is getting better, but it still isnt doing as well as my left arm.  Patient Stated Goal: To get her L arm back to 100%.   Prior Functioning  Home Living Lives With: Alone Available Help at Discharge: Family Type of Home: House Home Access: Stairs to enter [employer] of Steps: 1 step Entrance Stairs-Rails: None Home Layout: Two level;Laundry or work area in basement Alternate Level Stairs-Rails: None Bathroom Shower/Tub: Associate Professor: Yes How Accessible: Accessible via walker Home Adaptive Equipment: Straight cane (has first alert) Prior Function Level of Independence: Independent Able to Take Stairs?: Yes Driving: Yes Vocation: Retired Comments: enjoys cooking for her family and is quite independent with all daily activities.  She recently renewed her drivers license and was able to do so without any restrictions. Communication Communication: No difficulties Dominant Hand: Right    Cognition  Overall Cognitive Status: Appears within functional limits for tasks assessed/performed Arousal/Alertness: Awake/alert Orientation Level: Appears intact for tasks assessed Behavior During Session: [facility] for tasks performed    Extremity/Trunk Assessment Right Upper Extremity Assessment RUE ROM/Strength/Tone: Deficits RUE ROM/Strength/Tone Deficits: RUE ROM WFL. RUE strength grade 4/5 in all movements.  RUE Sensation: Deficits RUE Sensation Deficits: Pt reports decreased  sensation in RUE compared to L when testing  light touch. Pt stated the touch on her RUE felt much duller/softer. RUE Coordination: Deficits RUE Coordination Deficits: Gross motor coordination in tact. Fine motor coordination impaired. Patient was unable to coordinate movements to perform the finger tips to thumb exercise. She required increase time and mod pa to touch her index, middle, third and small finger to her thumb. Patient was able to independently and accurately complete this activity with her LUE.    Mobility Bed Mobility Bed Mobility: Supine to Sit Supine to Sit: 7: Independent Details for Bed Mobility Assistance: Patient performed supine to sit (with HOB raised approx 45 degrees) independently.      Balance Balance Balance Assessed: No  End of Session OT - End of Session Activity Tolerance: Patient tolerated treatment well Patient left: in bed;with call bell/phone within reach;with family/visitor present;Other (comment) (being transferred to MRI)   Laverta Baltimore, OTS Occupational Therapy Student  04/29/2012, 10:30 AM

## 2012-04-29 NOTE — Progress Notes (Signed)
The MRI of the patient's brain revealed several scattered small acute infarctions involving the left hemisphere and a small amount of hemorrhage associated with the left parietal lobe infarct. I discussed the findings with neurologist, Dr. Gerilyn Pilgrim. He recommended continuing aspirin but to defer starting Coumadin for 2-4 weeks to avoid hemorrhagic transformation. The patient and her family were informed of the findings and the recommendations.

## 2012-04-29 NOTE — Progress Notes (Signed)
UR Chart Review Completed  

## 2012-04-29 NOTE — Progress Notes (Signed)
The patient is an 78 year old woman with a history significant for chronic atrial fibrillation and hypertension, who was admitted early this morning for right hand and arm weakness and numbness. She was briefly seen. Her chart, vital signs, laboratory studies were reviewed. We'll await the results of the MRI, 2-D echocardiogram, and carotid ultrasound. We'll continue aspirin therapy for now. We'll start Coumadin if the MRI confirms a stroke. The patient is in agreement with this. Consider discharging her later on this afternoon or early tomorrow morning depending on the physical therapist's recommendation and the results of the studies ordered. We'll decrease her IV fluids as she is hypertensive and she does not appear to be volume depleted.

## 2012-04-29 NOTE — Patient Instructions (Signed)
Provided patient with HEP including fine motor activities, theraputty, and RUE ROM/strengthening exercises. Had patient demonstrate understanding of HEP by performing a few of the exercises. Limited in education time as patient was scheduled for MRI.

## 2012-04-29 NOTE — H&P (Signed)
[facility] History and Physical  MAFALDA MCGINNISS JXB:[id number] DOB: 08/11/1921 DOA: 04/28/2012  Referring physician: EDP PCP: No primary provider on file.   Chief Complaint: Right Arm Numbness and weakness  HPI:  The patient is an 78 year old woman with a history of hypertension and atrial fibrillation who presented to the emergency department complaining of right hand and lower arm weakness that she noticed when she awoke at 6:30 the morning of admission. She was unable to pick up the phone and dial her family members without significant difficulty. Once her family members arrived they noticed that she was having trouble speaking and did not notice a right facial droop and she was mildly confused. The symptoms began to improve significantly at home but she decided to come in for evaluation when her right hand and lower arm numbness persisted, she is right-handed and was having difficulty with coordination. On arrival to ED was in A-Fib with RVR 130 hemodynamically stable, has never been on anticoagulation other than aspirin. Does not follow with a regular PCP. Denies any chest pain, SOB, or diaphoresis. No fevers or recent illness.   Review of Systems:  Review of Systems  Constitutional: Negative for fever, chills, weight loss, malaise/fatigue and diaphoresis.  HENT: Negative for hearing loss and congestion.   Eyes: Negative for blurred vision and double vision.  Respiratory: Negative for cough and shortness of breath.   Cardiovascular: Positive for leg swelling. Negative for chest pain, palpitations and orthopnea.  Gastrointestinal: Negative for nausea, vomiting and abdominal pain.  Genitourinary: Negative for dysuria.  Musculoskeletal: Negative for joint pain.  Skin: Negative for itching and rash.  Neurological: Positive for tingling and weakness. Negative for dizziness, tremors and headaches.     Past Medical History  Diagnosis Date  . Hypertension    History reviewed. No  pertinent past surgical history. Social History:  reports that she has never smoked. She does not have any smokeless tobacco history on file. She reports that she does not drink alcohol or use illicit drugs.  No Known Allergies  Family History  Problem Relation Age of Onset  . Heart attack Mother   . Stroke Other   . Leukemia Sister   . Cancer Brother   . Cancer Other     Prior to Admission medications   Medication Sig Start Date End Date Taking? Authorizing Provider  amLODipine (NORVASC) 5 MG tablet Take 5 mg by mouth every morning.   Yes Historical Provider, MD  aspirin 81 MG chewable tablet Chew 81 mg by mouth every morning.   Yes Historical Provider, MD  hydrochlorothiazide (HYDRODIURIL) 25 MG tablet Take 25 mg by mouth daily as needed. For fluid   Yes Historical Provider, MD  ibuprofen (ADVIL,MOTRIN) 200 MG tablet Take 200 mg by mouth 2 (two) times daily as needed.   Yes Historical Provider, MD   Physical Exam: Filed Vitals:   04/28/12 2135 04/29/12 0007 04/29/12 0115 04/29/12 0220  BP: 180/114 163/76  155/80  Pulse: 132 83  79  Temp: 98.9 F (37.2 C)  98.4 F (36.9 C) 98.2 F (36.8 C)  TempSrc: Oral   Oral  Resp: 18 18  20   Height:      Weight:      SpO2: 97% 99%  95%     General:  Healthy appearing 78 year old, NAD, pleasant and conversant  Eyes: Visual fields in tact, PERRL  ENT: MMM, trachea midline, no facial droop  Neck: No bruits, no JVD  Cardiovascular: Caren Hazy  Respiratory: CTAB  Abdomen: Soft, NT  Skin: no rases or lesions  Musculoskeletal: Changes of OA  Psychiatric: AOX3, normal affect  Neurologic: Right Hand and lower forearm loss of sensation to light touch, mild weakness, difficulty with coordination, loss of fine motor skills in right hand.   DTR normal, all other extremities with 5/5 strength and normal sensation, no facial droop, no dysphagia or speech difficulty  Labs on Admission:  Basic Metabolic Panel:  Lab 04/28/12 4540  NA  144  K 3.7  CL 105  CO2 27  GLUCOSE 126*  BUN 13  CREATININE 0.59  CALCIUM 9.7  MG --  PHOS --   Liver Function Tests:  Lab 04/28/12 2335  AST 20  ALT 19  ALKPHOS 72  BILITOT 0.2*  PROT 6.8  ALBUMIN 3.7   No results found for this basename: LIPASE:5,AMYLASE:5 in the last 168 hours No results found for this basename: AMMONIA:5 in the last 168 hours CBC:  Lab 04/28/12 2335  WBC 7.2  NEUTROABS 4.7  HGB 13.6  HCT 40.9  MCV 88.9  PLT 189   Cardiac Enzymes: No results found for this basename: CKTOTAL:5,CKMB:5,CKMBINDEX:5,TROPONINI:5 in the last 168 hours BNP: No components found with this basename: POCBNP:5 CBG: No results found for this basename: GLUCAP:5 in the last 168 hours  Radiological Exams on Admission: Dg Chest 2 View  04/28/2012  *RADIOLOGY REPORT*  Clinical Data: Right arm weakness; confusion.  CHEST - 2 VIEW  Comparison: Chest radiograph performed 07/27/2010  Findings: There is partial left-sided volume loss, with associated postoperative changes as seen on the prior study.  Adjacent opacity appears grossly stable from prior studies, without definite evidence of acute airspace consolidation.  The right lung remains clear.  No pleural effusion or pneumothorax is seen.  The lung apices are incompletely imaged on this study. Minimal nodularity at the left midlung zone appears stable from 2008.  The heart is borderline enlarged; the mediastinal contour is within normal limits.  No acute osseous abnormalities are seen.  IMPRESSION: Left-sided postoperative changes and associated chronic opacification are stable, without definite evidence for superimposed airspace consolidation.  Borderline cardiomegaly.  Original Report Authenticated By: Tonia Ghent, M.D.   Ct Head Wo Contrast  04/28/2012  *RADIOLOGY REPORT*  Clinical Data: Right arm weakness and mild confusion.  CT HEAD WITHOUT CONTRAST  Technique:  Contiguous axial images were obtained from the base of the skull  through the vertex without contrast.  Comparison: CT of the head, and MRI/MRA of the brain performed 07/27/2010  Findings: There is no evidence of acute infarction, mass lesion, or intra- or extra-axial hemorrhage on CT.  Prominence of the ventricles and sulci reflects mild cortical volume loss.  Mild cerebellar atrophy is noted.  Mild periventricular white matter change likely reflects small vessel ischemic microangiopathy.  The brainstem and fourth ventricle are within normal limits.  The basal ganglia are unremarkable in appearance.  The cerebral hemispheres demonstrate grossly normal gray-white differentiation. No mass effect or midline shift is seen.  There is no evidence of fracture; visualized osseous structures are unremarkable in appearance.  The orbits are within normal limits. Mild mucosal thickening is noted within the maxillary sinuses; the remaining paranasal sinuses and mastoid air cells are well-aerated. No significant soft tissue abnormalities are seen.  IMPRESSION:  1.  No acute intracranial pathology seen on CT. 2.  Mild cortical volume loss and scattered small vessel ischemic microangiopathy. 3.  Mild mucosal thickening in the maxillary sinuses.  Original Report Authenticated  By: JEFFREY CHANG, M.D.    EKG:A-Fib, RVR Assessment/Plan Active Problems:  TIA (transient ischemic attack)  CVA (cerebral infarction)  Hypertension   1. TIA/ Minor Stroke in setting of Atrial Fibrillation and Hypertension  High risk for stroke recurrence. ABCD =6. TIA admission orders placed. Was on ASA 81 at home. Baseline Hypertensive and on arrival was in A-Fib with RVR- Strongly suspect stroke. Fortunately the severity of her symptoms have decreased dramatically over the past 12 hours.   Plan: 1. MRI Brain-need to confirm stroke-especially if considering starting anticoagulation (Patient neverous about MRI-says she is claustrophobic-we discussed medication) 2. ASA given in ED 3. Resume oral BP meds,  pressure SBP 180's now 150's-Holding Norvasc/started Metoprolol for BP benefit and rate control. Goal is for gradual reduction of BP in setting of possible stroke. 4. FLP- may need statin, but source probably embolic from A-Fib 5. I had a lengthy discussion with her about Antioagulation-either with coumadin or one of the new agents-she is a very health 78 yo no history of falls, no Gi bleeds and minimal problems. Anticoag will probably be her best protection against future strokes but will need to weigh risk benefits. She will consider options. 6. 2D echo ordered to asses LV- doubt TEE will be helpful.   Code Status: DNR Family Communication: Plan of care discussed with patient and her daughter at the bedside. Disposition Plan: Home with PT/OT  Anderson Malta, MD  [facility] Pager [phone]  If 7PM-7AM, please contact night-coverage [email or web address] Password [facility] 04/29/2012, 4:49 AM

## 2012-04-30 ENCOUNTER — Encounter (HOSPITAL_COMMUNITY): Payer: Self-pay | Admitting: Internal Medicine

## 2012-04-30 DIAGNOSIS — I619 Nontraumatic intracerebral hemorrhage, unspecified: Secondary | ICD-10-CM

## 2012-04-30 DIAGNOSIS — I6340 Cerebral infarction due to embolism of unspecified cerebral artery: Secondary | ICD-10-CM

## 2012-04-30 DIAGNOSIS — E785 Hyperlipidemia, unspecified: Secondary | ICD-10-CM

## 2012-04-30 DIAGNOSIS — I4891 Unspecified atrial fibrillation: Secondary | ICD-10-CM

## 2012-04-30 DIAGNOSIS — I10 Essential (primary) hypertension: Secondary | ICD-10-CM

## 2012-04-30 HISTORY — DX: Hyperlipidemia, unspecified: E78.5

## 2012-04-30 MED ORDER — ASPIRIN 81 MG PO CHEW: 162.0000 mg | CHEWABLE_TABLET | ORAL | Status: DC

## 2012-04-30 MED ORDER — METOPROLOL SUCCINATE ER 25 MG PO TB24: 25.0000 mg | ORAL_TABLET | Freq: Every day | ORAL | Status: DC

## 2012-04-30 MED ORDER — ATORVASTATIN CALCIUM 10 MG PO TABS: 0.5000 mg | ORAL_TABLET | Freq: Every day | ORAL | Status: DC

## 2012-04-30 MED ORDER — FUROSEMIDE 10 MG/ML IJ SOLN: 10.0000 mg | Freq: Once | INTRAMUSCULAR | Status: DC

## 2012-04-30 MED ORDER — FUROSEMIDE 20 MG PO TABS
20.0000 mg | ORAL_TABLET | Freq: Once | ORAL | Status: AC
  Administered 2012-04-30: 20 mg via ORAL
  Filled 2012-04-30: qty 1

## 2012-04-30 NOTE — Discharge Summary (Signed)
Pt dc'd to home with daughter dc and f/u appts given

## 2012-04-30 NOTE — Progress Notes (Signed)
Occupational Therapy Treatment Patient Details Name: Kathryn Marquez MRN: [id number] DOB: [birth year] Today's Date: 04/30/2012 Time: 0865-7846 OT Time Calculation (min): 23 min  OT Assessment / Plan / Recommendation Comments on Treatment Session A: Patient completed ADL task at sink with CGA for balance. She required increased time to complete the task due to likely processing deficits; SBA and moderate verbal cueing were provided throughout ADL activity for direction and proper sequencing  of tasks. Minimal verbal cueing was provided during theraband exercises for proper positioning and body mechanics.                Frequency Min 3X/week   Plan Frequency remains appropriate    Precautions / Restrictions Precautions Precautions: Fall Restrictions Weight Bearing Restrictions: No   Pertinent Vitals/Pain No reported pain    ADL  Grooming: Performed (SBA for processing and sequencing) Where Assessed - Grooming: Unsupported standing (stood at walker level with CGA for safety/balance)    OT Diagnosis:   Decreased strength and fine motor coordination, dominant side   OT Goals ADL Goals ADL Goal: Grooming - Progress: Progressing toward goals  Visit Information  Last OT Received On: 04/30/12    Subjective Data  Subjective: S: My right hand is better and I finally got some sleep.      Cognition   WNL  Mobility  Pt ambulated approximately 10 feet total from bed to sink and back to bed. She used RW with CGA for balance and minimal verbal cueing for maintaining awareness of location of walker relative to furniture, IV pole, etc.  Exercises General Exercises - Upper Extremity Shoulder Flexion: Theraband;10 reps Theraband Level (Shoulder Flexion): Level 3 (Green) Shoulder Extension: Theraband;10 reps Theraband Level (Shoulder Extension): Level 3 (Green) Shoulder ABduction: Theraband;10 reps Theraband Level (Shoulder Abduction): Level 3 (Green) Shoulder Horizontal ABduction:  Theraband;10 reps Theraband Level (Shoulder Horizontal Abduction): Level 3 (Green) Other Exercises Other Exercises: Squeezed red theraputty x 10       End of Session OT - End of Session Equipment Utilized During Treatment: Gait belt Activity Tolerance: Patient tolerated treatment well Patient left: in bed;with call bell/phone within reach;with family/visitor present   Laverta Baltimore, OTS Occupational Therapy Student Note reviewed by clinical instructor and accurately reflects treatment session.  Shirlean Mylar, OTR/L  04/30/2012, 9:35 AM

## 2012-04-30 NOTE — Progress Notes (Signed)
Occupational Therapy Treatment Patient Details Name: Kathryn Marquez MRN: [id number] DOB: 1921/02/20 Today's Date: 04/30/2012 Time: 4098-1191 OT Time Calculation (min): 23 min  OT Assessment / Plan / Recommendation Comments on Treatment Session A: Patient completed ADL task at sink with CGA for balance. She required increased time to complete the task due to likely processing deficits; SBA and moderate verbal cueing were provided throughout ADL activity for direction and proper sequencing  of tasks. Minimal verbal cueing was provided during theraband exercises for proper positioning and body mechanics.                Frequency Min 3X/week   Plan Frequency remains appropriate    Precautions / Restrictions Precautions Precautions: Fall Restrictions Weight Bearing Restrictions: No   Pertinent Vitals/Pain No reported pain    ADL  Grooming: Performed (SBA for processing and sequencing) Where Assessed - Grooming: Unsupported standing (stood at walker level with CGA for safety/balance)    OT Diagnosis:   Decreased strength and fine motor coordination, dominant side   OT Goals ADL Goals ADL Goal: Grooming - Progress: Progressing toward goals  Visit Information  Last OT Received On: 04/30/12    Subjective Data  Subjective: S: My right hand is better and I finally got some sleep.      Cognition   WNL  Mobility  Pt ambulated approximately 10 feet total from bed to sink and back to bed. She used RW with CGA for balance and minimal verbal cueing for maintaining awareness of location of walker relative to furniture, IV pole, etc.  Exercises General Exercises - Upper Extremity Shoulder Flexion: Theraband;10 reps Theraband Level (Shoulder Flexion): Level 3 (Green) Shoulder Extension: Theraband;10 reps Theraband Level (Shoulder Extension): Level 3 (Green) Shoulder ABduction: Theraband;10 reps Theraband Level (Shoulder Abduction): Level 3 (Green) Shoulder Horizontal ABduction:  Theraband;10 reps Theraband Level (Shoulder Horizontal Abduction): Level 3 (Green) Other Exercises Other Exercises: Squeezed red theraputty x 10       End of Session OT - End of Session Equipment Utilized During Treatment: Gait belt Activity Tolerance: Patient tolerated treatment well Patient left: in bed;with call bell/phone within reach;with family/visitor present   Laverta Baltimore, OTS Occupational Therapy Student  04/30/2012, 9:35 AM

## 2012-04-30 NOTE — Discharge Summary (Signed)
Physician Discharge Summary  ORA BOLLIG MRN: [id number] DOB/AGE: [birth year] 76 y.o.  PCP: Assunta Found, MD   Admit date: 04/28/2012 Discharge date: 04/30/2012  Discharge Diagnoses:  1.Acute small embolic left brain strokes; small amount of hemorrhage associated with the left parietal lobe infarct. 2. Long-standing  atrial fibrillation. Coumadin is being delayed for 2-4 weeks to avoid hemorrhagic transformation. The patient was discharged on a higher dose of aspirin. Aspirin should be discontinued if Coumadin is started in 2-4 weeks. Followup MRI of the brain or CT scan of the brain should be considered before starting Coumadin. 3. Malignant hypertension. 4. Mild dyslipidemia. The patient's lipid profile revealed total cholesterol 199, triglycerides of 109, HDL cholesterol 74, and LDL cholesterol of 103.  5. Mild right-sided hemiparesis secondary to the acute embolic strokes.    Medication List  As of 04/30/2012 11:40 AM   STOP taking these medications         ibuprofen 200 MG tablet         TAKE these medications         amLODipine 5 MG tablet   Commonly known as: NORVASC   Take 5 mg by mouth every morning.      aspirin 81 MG chewable tablet   Chew 2 tablets (162 mg total) by mouth every morning.      atorvastatin 10 MG tablet   Commonly known as: LIPITOR   Take 0.5 tablets (5 mg total) by mouth at bedtime. New medicine to treat your elevated cholesterol.      hydrochlorothiazide 25 MG tablet   Commonly known as: HYDRODIURIL   Take 25 mg by mouth daily as needed. For fluid      metoprolol succinate 25 MG 24 hr tablet   Commonly known as: TOPROL-XL   Take 1 tablet (25 mg total) by mouth daily. New blood pressure medicine.            Discharge Condition: Improved.  Disposition: Home.    Consults: Curbside consult with Dr. Gerilyn Pilgrim.   Significant Diagnostic Studies: Dg Chest 2 View  04/28/2012  *RADIOLOGY REPORT*  Clinical Data: Right arm weakness;  confusion.  CHEST - 2 VIEW  Comparison: Chest radiograph performed 07/27/2010  Findings: There is partial left-sided volume loss, with associated postoperative changes as seen on the prior study.  Adjacent opacity appears grossly stable from prior studies, without definite evidence of acute airspace consolidation.  The right lung remains clear.  No pleural effusion or pneumothorax is seen.  The lung apices are incompletely imaged on this study. Minimal nodularity at the left midlung zone appears stable from 2008.  The heart is borderline enlarged; the mediastinal contour is within normal limits.  No acute osseous abnormalities are seen.  IMPRESSION: Left-sided postoperative changes and associated chronic opacification are stable, without definite evidence for superimposed airspace consolidation.  Borderline cardiomegaly.  Original Report Authenticated By: Tonia Ghent, M.D.   Ct Head Wo Contrast  04/28/2012  *RADIOLOGY REPORT*  Clinical Data: Right arm weakness and mild confusion.  CT HEAD WITHOUT CONTRAST  Technique:  Contiguous axial images were obtained from the base of the skull through the vertex without contrast.  Comparison: CT of the head, and MRI/MRA of the brain performed 07/27/2010  Findings: There is no evidence of acute infarction, mass lesion, or intra- or extra-axial hemorrhage on CT.  Prominence of the ventricles and sulci reflects mild cortical volume loss.  Mild cerebellar atrophy is noted.  Mild periventricular white matter change likely reflects  small vessel ischemic microangiopathy.  The brainstem and fourth ventricle are within normal limits.  The basal ganglia are unremarkable in appearance.  The cerebral hemispheres demonstrate grossly normal gray-white differentiation. No mass effect or midline shift is seen.  There is no evidence of fracture; visualized osseous structures are unremarkable in appearance.  The orbits are within normal limits. Mild mucosal thickening is noted within the  maxillary sinuses; the remaining paranasal sinuses and mastoid air cells are well-aerated. No significant soft tissue abnormalities are seen.  IMPRESSION:  1.  No acute intracranial pathology seen on CT. 2.  Mild cortical volume loss and scattered small vessel ischemic microangiopathy. 3.  Mild mucosal thickening in the maxillary sinuses.  Original Report Authenticated By: Tonia Ghent, M.D.   Mri Brain Without Contrast  04/29/2012  *RADIOLOGY REPORT*  Clinical Data:  Right hand/forearm weakness.  Confusion. Difficulty speaking.  MRI HEAD WITHOUT CONTRAST MRA HEAD WITHOUT CONTRAST  Technique: Multiplanar, multiecho pulse sequences of the brain and surrounding structures were obtained according to standard protocol without intravenous contrast.  Angiographic images of the head were obtained using MRA technique without contrast.  Comparison: 04/29/2012 CT.  07/27/2010 MR.  MRI HEAD  Findings:  Several scattered small acute infarcts involving the left hemisphere (left frontal lobe, left parietal lobe and left occipital lobe as well as possibly the posterior left temporal lobe).  Small amount of hemorrhage associated with left parietal lobe infarct.  Global atrophy without hydrocephalus.  Small vessel disease type changes.  No intracranial mass lesion detected on this unenhanced exam.  Paranasal sinus mucosal thickening.  Hyperostosis frontalis interna incidentally noted.  IMPRESSION: Several scattered small acute infarcts involving the left hemisphere (left frontal lobe, left parietal lobe and left occipital lobe as well as possibly the posterior left temporal lobe).  Small amount of hemorrhage associated with left parietal lobe infarct.  Global atrophy without hydrocephalus.  Small vessel disease type changes.  Paranasal sinus mucosal thickening.  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  Moderate focal stenosis A1 segment right anterior cerebral artery.  Moderate narrowing M1 segment right middle cerebral artery.  Middle cerebral artery and moderate branch vessel irregularity bilaterally.  Fetal origin of the posterior cerebral artery bilaterally.  Ectatic vertebral arteries and basilar artery with mild irregularity without high-grade stenosis.  Nonvisualization right PICA.  Moderate tandem stenosis left PICA.  Poor delineation AICA bilaterally.  Irregularity of the superior cerebellar arteries with moderate narrowing of the left.  Posterior cerebral artery moderate tandem stenoses.  No aneurysm or vascular malformation noted.  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  Please see above.  This has been made a PRA call report utilizing dashboard call feature.  Original Report Authenticated By: Fuller Canada, M.D.   US Carotid Duplex Bilateral  04/29/2012  *RADIOLOGY REPORT*  Clinical Data: TIA, history hypertension  BILATERAL CAROTID DUPLEX ULTRASOUND  Technique: Gray scale imaging, color Doppler and duplex ultrasound was performed of bilateral carotid and vertebral arteries in the neck.  Comparison:  07/28/2010  Criteria:  Quantification of carotid stenosis is based on velocity parameters that correlate the residual internal carotid diameter with NASCET-based stenosis levels, using the diameter of the distal internal carotid lumen  as the denominator for stenosis measurement.  The following velocity measurements were obtained:                   PEAK SYSTOLIC/END DIASTOLIC RIGHT ICA:                        95/14cm/sec CCA:                        73/16cm/sec SYSTOLIC ICA/CCA RATIO:     1.30 DIASTOLIC ICA/CCA RATIO:    0.89 ECA:                        96cm/sec  LEFT ICA:                         252/26cm/sec CCA:                        77/13cm/sec SYSTOLIC ICA/CCA RATIO:     3.28 DIASTOLIC ICA/CCA RATIO:    1.98 ECA:                        133cm/sec  Findings:  RIGHT CAROTID ARTERY: Mildly tortuous right carotid system. Intimal thickening and minimal noncalcified plaque right CCA into right carotid bulb.  Small amount of plaque identified at the proximal right ICA, noncalcified.  Laminar flow by color Doppler imaging.  Patient arrhythmic during imaging.  No high velocity jets.  Minimal spectral broadening identified on waveform analysis within the right ICA, suspect related to tortuosity.  RIGHT VERTEBRAL ARTERY:  Patent, antegrade  LEFT CAROTID ARTERY: Tortuous left ICA and CCA.  Intimal thickening and minimal noncalcified plaque left CCA into left carotid bulb. Echogenic nonshadowing plaque proximal left ECA.  No significant gray scale narrowing of the left ICA is identified by plaque. Significant turbulence and spectral broadening with increased peak systolic velocity identified at a markedly tortuous mid to distal left ICA segment.  No definite plaque is seen at this site.  LEFT VERTEBRAL ARTERY:  Patent, antegrade  IMPRESSION: Minimal plaque within the common carotid arteries, carotid bulbs and proximal internal carotid arteries. Tortuous carotid systems, most notably a highly tortuous segment of distal left ICA. Increased peak systolic velocity and turbulence are identified within the distal left ICA at this markedly tortuous segment. The lack of significant plaque formation at this site makes the elevated peak systolic velocity likely due to tortuosity rather than narrowing. No definite evidence of hemodynamically significant stenosis. Patient arrhythmic during imaging.  Original Report Authenticated By: Lollie Marrow, M.D.   Mr Mra Head/brain Wo Cm  04/29/2012  *RADIOLOGY REPORT*  Clinical Data:  Right hand/forearm weakness.  Confusion. Difficulty speaking.  MRI HEAD WITHOUT CONTRAST MRA HEAD  WITHOUT CONTRAST  Technique: Multiplanar, multiecho pulse sequences of the brain and surrounding structures were obtained according to standard protocol without intravenous contrast.  Angiographic images of the head were obtained using MRA technique without contrast.  Comparison: 04/29/2012 CT.  07/27/2010 MR.  MRI HEAD  Findings:  Several scattered small acute infarcts involving the left hemisphere (left frontal lobe, left parietal lobe and left occipital lobe as well as possibly the posterior left temporal lobe).  Small amount of hemorrhage associated with left parietal lobe infarct.  Global atrophy without hydrocephalus.  Small vessel disease type changes.  No intracranial mass lesion detected on this unenhanced  exam.  Paranasal sinus mucosal thickening.  Hyperostosis frontalis interna incidentally noted.  IMPRESSION: Several scattered small acute infarcts involving the left hemisphere (left frontal lobe, left parietal lobe and left occipital lobe as well as possibly the posterior left temporal lobe).  Small amount of hemorrhage associated with left parietal lobe infarct.  Global atrophy without hydrocephalus.  Small vessel disease type changes.  Paranasal sinus mucosal thickening.  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  Moderate focal stenosis A1 segment right anterior cerebral artery.  Moderate narrowing M1 segment right middle cerebral artery.  Middle cerebral artery and moderate branch vessel irregularity bilaterally.  Fetal origin of the posterior cerebral artery bilaterally.  Ectatic vertebral arteries and basilar artery with mild irregularity without high-grade stenosis.  Nonvisualization right PICA.  Moderate tandem stenosis left PICA.  Poor delineation AICA bilaterally.  Irregularity of the superior cerebellar arteries with moderate  narrowing of the left.  Posterior cerebral artery moderate tandem stenoses.  No aneurysm or vascular malformation noted.  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  Please see above.  This has been made a PRA call report utilizing dashboard call feature.  Original Report Authenticated By: Fuller Canada, M.D.   ECHO: PENDING.   Microbiology: No results found for this or any previous visit (from the past 240 hour(s)).   Labs: Results for orders placed during the hospital encounter of 04/28/12 (from the past 48 hour(s))  URINALYSIS, ROUTINE W REFLEX MICROSCOPIC     Status: Abnormal   Collection Time   04/28/12 11:08 PM      Component Value Range Comment   Color, Urine STRAW (*) YELLOW    APPearance CLEAR  CLEAR    Specific Gravity, Urine <1.005 (*) 1.005 - 1.030    pH 7.5  5.0 - 8.0    Glucose, UA NEGATIVE  NEGATIVE mg/dL    Hgb urine dipstick NEGATIVE  NEGATIVE    Bilirubin Urine NEGATIVE  NEGATIVE    Ketones, ur NEGATIVE  NEGATIVE mg/dL    Protein, ur NEGATIVE  NEGATIVE mg/dL    Urobilinogen, UA 0.2  0.0 - 1.0 mg/dL    Nitrite NEGATIVE  NEGATIVE    Leukocytes, UA TRACE (*) NEGATIVE   URINE MICROSCOPIC-ADD ON     Status: Normal   Collection Time   04/28/12 11:08 PM      Component Value Range Comment   Squamous Epithelial / LPF RARE  RARE    WBC, UA 0-2  <3 WBC/hpf    RBC / HPF 0-2  <3 RBC/hpf    Bacteria, UA RARE  RARE   CBC     Status: Normal   Collection Time   04/28/12 11:35 PM      Component Value Range Comment   WBC 7.2  4.0 - 10.5 K/uL    RBC 4.60  3.87 - 5.11 MIL/uL    Hemoglobin 13.6  12.0 - 15.0 g/dL    HCT 21.3  08.6 - 57.8 %    MCV 88.9  78.0 - 100.0 fL    MCH 29.6  26.0 - 34.0 pg    MCHC 33.3  30.0 - 36.0 g/dL    RDW 46.9  62.9 - 52.8 %  Platelets 189  150 - 400 K/uL   DIFFERENTIAL     Status: Normal    Collection Time   04/28/12 11:35 PM      Component Value Range Comment   Neutrophils Relative 65  43 - 77 %    Neutro Abs 4.7  1.7 - 7.7 K/uL    Lymphocytes Relative 25  12 - 46 %    Lymphs Abs 1.8  0.7 - 4.0 K/uL    Monocytes Relative 9  3 - 12 %    Monocytes Absolute 0.6  0.1 - 1.0 K/uL    Eosinophils Relative 1  0 - 5 %    Eosinophils Absolute 0.1  0.0 - 0.7 K/uL    Basophils Relative 0  0 - 1 %    Basophils Absolute 0.0  0.0 - 0.1 K/uL   COMPREHENSIVE METABOLIC PANEL     Status: Abnormal   Collection Time   04/28/12 11:35 PM      Component Value Range Comment   Sodium 144  135 - 145 mEq/L    Potassium 3.7  3.5 - 5.1 mEq/L    Chloride 105  96 - 112 mEq/L    CO2 27  19 - 32 mEq/L    Glucose, Bld 126 (*) 70 - 99 mg/dL    BUN 13  6 - 23 mg/dL    Creatinine, Ser 5.62  0.50 - 1.10 mg/dL    Calcium 9.7  8.4 - 13.0 mg/dL    Total Protein 6.8  6.0 - 8.3 g/dL    Albumin 3.7  3.5 - 5.2 g/dL    AST 20  0 - 37 U/L    ALT 19  0 - 35 U/L    Alkaline Phosphatase 72  39 - 117 U/L    Total Bilirubin 0.2 (*) 0.3 - 1.2 mg/dL    GFR calc non Af Amer 79 (*) >90 mL/min    GFR calc Af Amer >90  >90 mL/min   PROTIME-INR     Status: Normal   Collection Time   04/28/12 11:35 PM      Component Value Range Comment   Prothrombin Time 13.4  11.6 - 15.2 seconds    INR 1.00  0.00 - 1.49   LIPID PANEL     Status: Abnormal   Collection Time   04/29/12  5:18 AM      Component Value Range Comment   Cholesterol 199  0 - 200 mg/dL    Triglycerides 865  <784 mg/dL    HDL 74  >69 mg/dL    Total CHOL/HDL Ratio 2.7      VLDL 22  0 - 40 mg/dL    LDL Cholesterol 629 (*) 0 - 99 mg/dL   CBC     Status: Normal   Collection Time   04/29/12  5:18 AM      Component Value Range Comment   WBC 6.9  4.0 - 10.5 K/uL    RBC 4.55  3.87 - 5.11 MIL/uL    Hemoglobin 13.4  12.0 - 15.0 g/dL    HCT 52.8  41.3 - 24.4 %    MCV 89.2  78.0 - 100.0 fL    MCH 29.5  26.0 - 34.0 pg    MCHC 33.0  30.0 - 36.0 g/dL    RDW 01.0   27.2 - 53.6 %    Platelets 183  150 - 400 K/uL   CREATININE, SERUM     Status: Abnormal  Collection Time   04/29/12  5:18 AM      Component Value Range Comment   Creatinine, Ser 0.53  0.50 - 1.10 mg/dL    GFR calc non Af Amer 82 (*) >90 mL/min    GFR calc Af Amer >90  >90 mL/min   TSH     Status: Normal   Collection Time   04/29/12  5:18 AM      Component Value Range Comment   TSH 1.947  0.350 - 4.500 uIU/mL   HEMOGLOBIN A1C     Status: Abnormal   Collection Time   04/29/12  5:18 AM      Component Value Range Comment   Hemoglobin A1C 5.7 (*) <5.7 %    Mean Plasma Glucose 117 (*) <117 mg/dL      HPI : The patient is an 78 year old woman with a history significant for atrial fibrillation and hypertension who presented to the emergency department on 04/29/12 with a chief complaint of right arm numbness and weakness. On arrival to the emergency department, she was found to be in atrial fibrillation with a heart rate of 130 beats per minute. Her blood pressure was 180/114. She was afebrile. CT scan of her head revealed no acute intracranial pathology. Her chest x-ray revealed no acute abnormalities but chronic left-sided postoperative changes and chronic opacification. She was admitted for further evaluation and management.  HOSPITAL COURSE:The patient was started on maintenance IV fluids. However, because of her malignant hypertension, IV fluids were tapered down. She was continued on antiplatelet therapy with aspirin, however the dose was increased to 325 mg rather than 81 mg daily that she had been taking. She was continued on Norvasc, but hydrochlorothiazide was temporarily withheld. For both blood pressure control and heart rate control, extended release metoprolol was added. Anticoagulation was deferred until the MRI confirmed an acute stroke. For further evaluation, a number of studies were ordered. Her hemoglobin A1c was within normal limits at 5.7. Her TSH was within normal limits at 1.9.  Her urinalysis was not indicative of infection. The carotid ultrasound revealed no significant ICA stenosis. MRI of her brain revealed several scattered small acute infarcts involving the left hemisphere. There was a small amount of hemorrhage associated with the left parietal lobe infarct. 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 The 2-D echocardiogram was performed, but was not read prior to discharge. Fasting lipid profile results were dictated above. Upon discharge, a statin therapy was started with Lipitor.  The patient was evaluated by both the occupational and physical therapist. They both recommended home or outpatient therapy. The physical therapist recommended a rolling walker for assistance with ambulation rather than a cane. A roller walker was ordered. Home health physical therapy and occupational therapy were ordered prior to discharge.  The patient remained stable. She did have a few crackles in her lung bases. IV fluids were discontinued. She was oral Lasix 20 mg once. She was advised to restart hydrochlorothiazide following discharge. Both she and her daughter were informed that Toprol-XL was added on as a new antihypertensive and rate controlling medication. She was instructed to continue hydrochlorothiazide and Norvasc.    I discussed the patient's MRI findings with Dr. Gerilyn Pilgrim (curbside consultation). He recommended continuing aspirin therapy and to delay Coumadin for 2-4 weeks to avoid hemorrhagic transformation. He stated that it was not unusual to have a small amount  of hemorrhage associated with thrombotic or embolic strokes which are not the same as having a gross hemorrhagic stroke. The patient was receptive to starting Coumadin. Both she and her family were informed of the recommendations to start Coumadin in 2-4 weeks. Her daughter was concerned about  potentially starting Coumadin without a followup MRI or CT scan of her brain. Therefore, it is not unreasonable to repeat the MRI or CT scan of her brain to rule out extension of the peri-infarct small hemorrhage. This could be arranged by Dr. Assunta Found, her primary care physician. I called Dr. Lamar Blinks office. Dr. Phillips Odor was not available, but I did leave a message with his nurse regarding the recommendations.  Discharge Exam:  Blood pressure 156/71, pulse 64, temperature 98 F (36.7 C), temperature source Oral, resp. rate 20, height 5\' 5"  (1.651 m), weight 72.576 kg (160 lb), SpO2 93.00%. Lungs: Occasional crackles in the bases. Breathing nonlabored. Heart: Irregular, irregular with mild bradycardia. Abdomen: Positive bowel sounds, soft, nontender, nondistended. Extremities: No pedal edema. Neurologic: She is alert and oriented x3. She has a mild right-sided facial droop. She has 4+ over 5 right upper and right lower extremity strength. Sensation is grossly decrease. Strength on the left is 5 over 5. Occasionally, she has some hesitation with speaking but no outright aphasia.     Discharge Orders    Future Orders Please Complete By Expires   Diet - low sodium heart healthy      Increase activity slowly         Follow-up Information    Follow up with Colette Ribas, MD on 05/08/2012. (AT 3:00 PM.)    Contact information:   [address] [place] [address] [place] [address] [phone]          Discharge time: 40 minutes.  Signed: Maressa Apollo 04/30/2012, 11:40 AM

## 2016-01-05 DIAGNOSIS — Z1389 Encounter for screening for other disorder: Secondary | ICD-10-CM | POA: Diagnosis not present

## 2016-01-05 DIAGNOSIS — Z6828 Body mass index (BMI) 28.0-28.9, adult: Secondary | ICD-10-CM | POA: Diagnosis not present

## 2016-01-05 DIAGNOSIS — R6889 Other general symptoms and signs: Secondary | ICD-10-CM | POA: Diagnosis not present

## 2016-01-05 DIAGNOSIS — J180 Bronchopneumonia, unspecified organism: Secondary | ICD-10-CM | POA: Diagnosis not present

## 2016-01-05 DIAGNOSIS — E663 Overweight: Secondary | ICD-10-CM | POA: Diagnosis not present

## 2016-02-05 ENCOUNTER — Encounter (HOSPITAL_COMMUNITY): Payer: Self-pay | Admitting: *Deleted

## 2016-02-05 ENCOUNTER — Emergency Department (HOSPITAL_COMMUNITY): Payer: PPO

## 2016-02-05 ENCOUNTER — Observation Stay (HOSPITAL_COMMUNITY)
Admission: EM | Admit: 2016-02-05 | Discharge: 2016-02-06 | Disposition: A | Discharge status: Home / Self Care | Payer: PPO | Attending: Internal Medicine | Admitting: Internal Medicine

## 2016-02-05 DIAGNOSIS — Y929 Unspecified place or not applicable: Secondary | ICD-10-CM | POA: Insufficient documentation

## 2016-02-05 DIAGNOSIS — Z79899 Other long term (current) drug therapy: Secondary | ICD-10-CM | POA: Insufficient documentation

## 2016-02-05 DIAGNOSIS — Z7982 Long term (current) use of aspirin: Secondary | ICD-10-CM | POA: Diagnosis not present

## 2016-02-05 DIAGNOSIS — Z09 Encounter for follow-up examination after completed treatment for conditions other than malignant neoplasm: Secondary | ICD-10-CM

## 2016-02-05 DIAGNOSIS — S0993XA Unspecified injury of face, initial encounter: Secondary | ICD-10-CM | POA: Insufficient documentation

## 2016-02-05 DIAGNOSIS — S8001XA Contusion of right knee, initial encounter: Secondary | ICD-10-CM | POA: Insufficient documentation

## 2016-02-05 DIAGNOSIS — Y999 Unspecified external cause status: Secondary | ICD-10-CM | POA: Insufficient documentation

## 2016-02-05 DIAGNOSIS — S0031XA Abrasion of nose, initial encounter: Secondary | ICD-10-CM | POA: Diagnosis not present

## 2016-02-05 DIAGNOSIS — Y939 Activity, unspecified: Secondary | ICD-10-CM | POA: Insufficient documentation

## 2016-02-05 DIAGNOSIS — I639 Cerebral infarction, unspecified: Secondary | ICD-10-CM | POA: Insufficient documentation

## 2016-02-05 DIAGNOSIS — E785 Hyperlipidemia, unspecified: Secondary | ICD-10-CM | POA: Insufficient documentation

## 2016-02-05 DIAGNOSIS — S0083XA Contusion of other part of head, initial encounter: Principal | ICD-10-CM | POA: Diagnosis present

## 2016-02-05 DIAGNOSIS — W19XXXA Unspecified fall, initial encounter: Secondary | ICD-10-CM | POA: Diagnosis present

## 2016-02-05 DIAGNOSIS — M25559 Pain in unspecified hip: Secondary | ICD-10-CM | POA: Diagnosis not present

## 2016-02-05 DIAGNOSIS — I10 Essential (primary) hypertension: Secondary | ICD-10-CM | POA: Diagnosis present

## 2016-02-05 DIAGNOSIS — R4182 Altered mental status, unspecified: Secondary | ICD-10-CM | POA: Diagnosis not present

## 2016-02-05 DIAGNOSIS — S199XXA Unspecified injury of neck, initial encounter: Secondary | ICD-10-CM | POA: Diagnosis not present

## 2016-02-05 DIAGNOSIS — I482 Chronic atrial fibrillation, unspecified: Secondary | ICD-10-CM | POA: Diagnosis present

## 2016-02-05 DIAGNOSIS — W101XXA Fall (on)(from) sidewalk curb, initial encounter: Secondary | ICD-10-CM | POA: Diagnosis not present

## 2016-02-05 DIAGNOSIS — Z23 Encounter for immunization: Secondary | ICD-10-CM | POA: Diagnosis not present

## 2016-02-05 DIAGNOSIS — R51 Headache: Secondary | ICD-10-CM | POA: Diagnosis not present

## 2016-02-05 DIAGNOSIS — Z7901 Long term (current) use of anticoagulants: Secondary | ICD-10-CM

## 2016-02-05 DIAGNOSIS — S0990XA Unspecified injury of head, initial encounter: Secondary | ICD-10-CM | POA: Diagnosis present

## 2016-02-05 DIAGNOSIS — S299XXA Unspecified injury of thorax, initial encounter: Secondary | ICD-10-CM | POA: Diagnosis not present

## 2016-02-05 DIAGNOSIS — S8991XA Unspecified injury of right lower leg, initial encounter: Secondary | ICD-10-CM | POA: Diagnosis not present

## 2016-02-05 DIAGNOSIS — M25561 Pain in right knee: Secondary | ICD-10-CM | POA: Diagnosis not present

## 2016-02-05 DIAGNOSIS — W01198A Fall on same level from slipping, tripping and stumbling with subsequent striking against other object, initial encounter: Secondary | ICD-10-CM | POA: Diagnosis not present

## 2016-02-05 DIAGNOSIS — R030 Elevated blood-pressure reading, without diagnosis of hypertension: Secondary | ICD-10-CM | POA: Diagnosis not present

## 2016-02-05 DIAGNOSIS — S064X0A Epidural hemorrhage without loss of consciousness, initial encounter: Secondary | ICD-10-CM | POA: Diagnosis not present

## 2016-02-05 HISTORY — DX: Cardiac arrhythmia, unspecified: I49.9

## 2016-02-05 LAB — CBC WITH DIFFERENTIAL/PLATELET
Basophils Absolute: 0 10*3/uL (ref 0.0–0.1)
Basophils Relative: 0 %
EOS ABS: 0 10*3/uL (ref 0.0–0.7)
Eosinophils Relative: 0 %
HEMATOCRIT: 42.2 % (ref 36.0–46.0)
HEMOGLOBIN: 13.8 g/dL (ref 12.0–15.0)
LYMPHS ABS: 1.2 10*3/uL (ref 0.7–4.0)
Lymphocytes Relative: 11 %
MCH: 28.9 pg (ref 26.0–34.0)
MCHC: 32.7 g/dL (ref 30.0–36.0)
MCV: 88.3 fL (ref 78.0–100.0)
MONO ABS: 0.7 10*3/uL (ref 0.1–1.0)
MONOS PCT: 6 %
NEUTROS ABS: 9.4 10*3/uL — AB (ref 1.7–7.7)
NEUTROS PCT: 83 %
Platelets: 164 10*3/uL (ref 150–400)
RBC: 4.78 MIL/uL (ref 3.87–5.11)
RDW: 14.4 % (ref 11.5–15.5)
WBC: 11.4 10*3/uL — ABNORMAL HIGH (ref 4.0–10.5)

## 2016-02-05 LAB — BASIC METABOLIC PANEL
Anion gap: 10 (ref 5–15)
BUN: 19 mg/dL (ref 6–20)
CHLORIDE: 98 mmol/L — AB (ref 101–111)
CO2: 31 mmol/L (ref 22–32)
CREATININE: 0.71 mg/dL (ref 0.44–1.00)
Calcium: 8.8 mg/dL — ABNORMAL LOW (ref 8.9–10.3)
GFR calc Af Amer: >60 mL/min (ref >60)
GFR calc non Af Amer: >60 mL/min (ref >60)
Glucose, Bld: 155 mg/dL — ABNORMAL HIGH (ref 65–99)
Potassium: 3.4 mmol/L — ABNORMAL LOW (ref 3.5–5.1)
Sodium: 139 mmol/L (ref 135–145)

## 2016-02-05 LAB — CBG MONITORING, ED: Glucose-Capillary: 126 mg/dL — ABNORMAL HIGH (ref 65–99)

## 2016-02-05 MED ORDER — ACETAMINOPHEN 325 MG PO TABS
650.0000 mg | ORAL_TABLET | Freq: Once | ORAL | Status: AC
  Administered 2016-02-05: 650 mg via ORAL
  Filled 2016-02-05: qty 2

## 2016-02-05 MED ORDER — TETANUS-DIPHTH-ACELL PERTUSSIS 5-2.5-18.5 LF-MCG/0.5 IM SUSP
0.5000 mL | Freq: Once | INTRAMUSCULAR | Status: AC
  Administered 2016-02-05: 0.5 mL via INTRAMUSCULAR
  Filled 2016-02-05: qty 0.5

## 2016-02-05 MED ORDER — ONDANSETRON HCL 4 MG/2ML IJ SOLN
4.0000 mg | Freq: Once | INTRAMUSCULAR | Status: AC
  Administered 2016-02-05: 4 mg via INTRAVENOUS
  Filled 2016-02-05: qty 2

## 2016-02-05 MED ORDER — ONDANSETRON 4 MG PO TBDP
4.0000 mg | ORAL_TABLET | Freq: Once | ORAL | Status: AC
  Administered 2016-02-05: 4 mg via ORAL
  Filled 2016-02-05: qty 1

## 2016-02-05 NOTE — ED Notes (Signed)
Ice placed on patients head.

## 2016-02-05 NOTE — ED Notes (Signed)
Pt requesting nausea medication

## 2016-02-05 NOTE — ED Notes (Signed)
Pt states she is feeling nauseous. MD made aware.

## 2016-02-05 NOTE — ED Notes (Addendum)
MD at bedside  Family member states that pt is increasingly becoming sleepy and non-interactive. Pt alert and oriented upon evaluation.

## 2016-02-05 NOTE — ED Notes (Signed)
Pt comes in via EMS for a fall. Per pt, she tripped over the curb, hitting her forehead and face on the ground. Pt denies any loss of consciousness. Pt is xarelto. Large hematoma noted to pts right forehead. NAD noted. Pt alert and oriented.   EMS VS: BP 190/92

## 2016-02-05 NOTE — ED Notes (Signed)
MD at bedside. 

## 2016-02-05 NOTE — ED Notes (Signed)
Lab unable to get blood work.  

## 2016-02-05 NOTE — ED Notes (Signed)
Pt given apple just and apple sauce. NAD noted.

## 2016-02-05 NOTE — ED Notes (Signed)
Took patient a tray of food as per nurse. Set up the tray and patient was feeding self.

## 2016-02-05 NOTE — ED Notes (Signed)
MD at bedside.  Family stating that they are uncomfortable sending pt home.

## 2016-02-05 NOTE — ED Provider Notes (Addendum)
CSN: [id number]     Arrival date & time 02/05/16  1242 History  By signing my name below, I, Emmanuella Mensah, attest that this documentation has been prepared under the direction and in the presence of Ezequiel Essex, MD. Electronically Signed: Judithann Sauger, ED Scribe. 02/05/2016. 1:01 PM.      Chief Complaint  Patient presents with  . Fall  . Head Injury   Patient is a 82 y.o. female presenting with fall and head injury. The history is provided by the patient. No language interpreter was used.  Fall This is a new problem. The current episode started 3 to 5 hours ago. The problem has been gradually worsening. Associated symptoms include headaches. Pertinent negatives include no chest pain, no abdominal pain and no shortness of breath. Nothing aggravates the symptoms. Nothing relieves the symptoms. She has tried nothing for the symptoms.  Head Injury Associated symptoms: headache   Associated symptoms: no nausea, no neck pain and no vomiting    HPI Comments: Kathryn Marquez is a 82 y.o. female with a hx of HTN, stroke, and dyslipidemia brought in by ambulance, who presents to the Emergency Department with a large hematoma to her right forehead, abrasion to her nose, and bruising to the left knee s/p fall that occurred PTA. Pt reports associated generalized HA. She explains that she tripped and fell on the curb today. No LOC, pt states that she remembers falling. Pt is currently on Xarelto. No alleviating factors noted. Pt has not tried any medications PTA. No n/v, back pain, neck pain, visual disturbances, or any other pain.   Past Medical History  Diagnosis Date  . Hypertension   . Acute embolic stroke ([place]) 123XX123  . Dyslipidemia, goal LDL below 100 04/30/2012   History reviewed. No pertinent past surgical history. Family History  Problem Relation Age of Onset  . Heart attack Mother   . Stroke Other   . Leukemia Sister   . Cancer Brother   . Cancer Other    Social History   Substance Use Topics  . Smoking status: Never Smoker   . Smokeless tobacco: None  . Alcohol Use: No   OB History    No data available     Review of Systems  Eyes: Negative for visual disturbance.  Respiratory: Negative for shortness of breath.   Cardiovascular: Negative for chest pain.  Gastrointestinal: Negative for nausea, vomiting and abdominal pain.  Musculoskeletal: Negative for back pain and neck pain.  Neurological: Positive for headaches.  All other systems reviewed and are negative.     Allergies  Review of patient's allergies indicates no known allergies.  Home Medications   Prior to Admission medications   Medication Sig Start Date End Date Taking? Authorizing Provider  amLODipine (NORVASC) 5 MG tablet Take 5 mg by mouth every morning.   Yes Historical Provider, MD  aspirin 81 MG chewable tablet Chew 2 tablets (162 mg total) by mouth every morning. Patient taking differently: Chew 81 mg by mouth every evening.  04/30/12  Yes Rexene Alberts, MD  cetirizine (ZYRTEC) 10 MG tablet Take 10 mg by mouth daily.   Yes Historical Provider, MD  hydrochlorothiazide (HYDRODIURIL) 25 MG tablet Take 25 mg by mouth daily as needed. For fluid   Yes Historical Provider, MD  metoprolol succinate (TOPROL-XL) 25 MG 24 hr tablet Take 1 tablet (25 mg total) by mouth daily. New blood pressure medicine. 04/30/12 02/05/16 Yes Rexene Alberts, MD  atorvastatin (LIPITOR) 10 MG tablet Take 0.5 tablets (  5 mg total) by mouth at bedtime. New medicine to treat your elevated cholesterol. Patient not taking: Reported on 02/05/2016 04/30/12 02/05/16  Rexene Alberts, MD   BP 124/57 mmHg  Pulse 77  Temp(Src) 97.8 F (36.6 C) (Oral)  Resp 18  Ht 5\' 3"  (1.6 m)  Wt 160 lb (72.576 kg)  BMI 28.35 kg/m2  SpO2 93% Physical Exam  Constitutional: She is oriented to person, place, and time. She appears well-developed and well-nourished. No distress.  HENT:  Head: Normocephalic and atraumatic.  Right Ear: No  hemotympanum.  Left Ear: No hemotympanum.  Nose: No nasal septal hematoma.  Mouth/Throat: Oropharynx is clear and moist. No oropharyngeal exudate.  Eyes: Conjunctivae and EOM are normal. Pupils are equal, round, and reactive to light.  Neck: Normal range of motion. Neck supple.  No meningismus.  Cardiovascular: Normal rate, regular rhythm, normal heart sounds and intact distal pulses.   No murmur heard. Pulmonary/Chest: Effort normal and breath sounds normal. No respiratory distress.  Abdominal: Soft. There is no tenderness. There is no rebound and no guarding.  Musculoskeletal: Normal range of motion. She exhibits no edema or tenderness.  Ecchymosis right knee with reduced ROM Hematoma to right forehead Abrasion to bridge of nose No C, T, L spine tenderness  Full ROM of hips without pain  Neurological: She is alert and oriented to person, place, and time. No cranial nerve deficit. She exhibits normal muscle tone. Coordination normal.  No ataxia on finger to nose bilaterally. No pronator drift. 5/5 strength throughout. CN 2-12 intact.Equal grip strength. Sensation intact.   Skin: Skin is warm.  Psychiatric: She has a normal mood and affect. Her behavior is normal.  Nursing note and vitals reviewed.   ED Course  Procedures (including critical care time) DIAGNOSTIC STUDIES: Oxygen Saturation is 94% on RA, normal by my interpretation.    COORDINATION OF CARE: 12:58 PM- Pt advised of plan for treatment and pt agrees. Pt will receive imaging for further evaluation.    Labs Review Labs Reviewed  CBC WITH DIFFERENTIAL/PLATELET - Abnormal; Notable for the following:    WBC 11.4 (*)    Neutro Abs 9.4 (*)    All other components within normal limits  BASIC METABOLIC PANEL - Abnormal; Notable for the following:    Potassium 3.4 (*)    Chloride 98 (*)    Glucose, Bld 155 (*)    Calcium 8.8 (*)    All other components within normal limits  CBG MONITORING, ED - Abnormal; Notable for the  following:    Glucose-Capillary 126 (*)    All other components within normal limits  URINALYSIS, ROUTINE W REFLEX MICROSCOPIC (NOT AT [facility])    Imaging Review Dg Chest 1 View  02/05/2016  CLINICAL DATA:  Fall, injury to forehead and face. EXAM: CHEST 1 VIEW COMPARISON:  Chest x-ray dated 04/28/2012. FINDINGS: Study is limited by acquisition technique, including over-penetration at the level of the mid right lung. Grossly, lungs are clear. Lungs again appear hyperexpanded suggesting COPD. The pleural calcification along the left lateral chest wall, and associated chest wall deformity, is unchanged. Given the oblique patient positioning, cardiomediastinal silhouette is probably stable in size and configuration. Atherosclerotic changes again noted at the aortic arch. No acute-appearing osseous abnormality. IMPRESSION: Grossly, no acute findings.  Study limitations detailed above. Electronically Signed   By: Franki Cabot M.D.   On: 02/05/2016 13:58   Dg Pelvis 1-2 Views  02/05/2016  CLINICAL DATA:  Tripped over curb at [employer] and  fell hitting forehead and face on ground, RIGHT knee pain, pelvic pain, on Xarelto EXAM: PELVIS - 1-2 VIEW COMPARISON:  07/28/2010 FINDINGS: Diffuse osseous demineralization. Hip joint spaces minimally narrowed bilaterally. SI joints preserved. No acute fracture, dislocation or bone destruction. IMPRESSION: No acute osseous abnormalities. Osseous demineralization with minimal degenerative changes of BILATERAL hip joints. Electronically Signed   By: Lavonia Dana M.D.   On: 02/05/2016 13:59   Ct Head Wo Contrast  02/05/2016  CLINICAL DATA:  Change in mental status since earlier today, tripped over a curb and fell hitting forehead and face on the ground, denies loss of consciousness, on Xarelto EXAM: CT HEAD WITHOUT CONTRAST TECHNIQUE: Contiguous axial images were obtained from the base of the skull through the vertex without intravenous contrast. COMPARISON:  Repeat exam 1818  hours compared to 1316 hours CT head exam and correlated with prior MRI brain of 04/29/2012. FINDINGS: Generalized atrophy. Normal ventricular morphology. No midline shift or mass effect. Scattered small vessel chronic ischemic changes of deep cerebral white matter. Old appearing posterior LEFT parietal infarcts, question watershed infarct. No intracranial hemorrhage, mass lesion or evidence acute infarction. No definite extra-axial fluid collections. Large RIGHT frontal scalp hematoma extending periorbital. Osseous demineralization with scattered mucosal thickening in the maxillary sinuses. Atherosclerotic calcifications at skullbase. Calvaria intact. IMPRESSION: Atrophy with small vessel chronic ischemic changes of deep cerebral white matter and suspect old watershed infarct posterior LEFT parietal region, corresponding to areas of acute infarction on diffusion-weighted images from prior MR of 2013. No acute intracranial abnormalities. Electronically Signed   By: Lavonia Dana M.D.   On: 02/05/2016 18:42   Ct Head Wo Contrast  02/05/2016  CLINICAL DATA:  Recent fall with facial and head injury, initial encounter EXAM: CT HEAD WITHOUT CONTRAST TECHNIQUE: Contiguous axial images were obtained from the base of the skull through the vertex without intravenous contrast. COMPARISON:  04/29/2012 FINDINGS: The bony calvarium is intact. A large scalp hematoma is noted in the region of the right forehead. Mild atrophic changes are noted. Scattered areas of chronic white matter ischemic change are seen. No findings to suggest acute hemorrhage, acute infarction or space-occupying mass lesion are seen. IMPRESSION: Chronic atrophic and ischemic changes. Large right frontal scalp hematoma. No other focal abnormality is noted. Electronically Signed   By: Inez Catalina M.D.   On: 02/05/2016 13:44   Ct Cervical Spine Wo Contrast  02/05/2016  CLINICAL DATA:  Recent fall with facial and head injury, initial encounter EXAM: CT HEAD  WITHOUT CONTRAST TECHNIQUE: Contiguous axial images were obtained from the base of the skull through the vertex without intravenous contrast. COMPARISON:  04/29/2012 FINDINGS: The bony calvarium is intact. A large scalp hematoma is noted in the region of the right forehead. Mild atrophic changes are noted. Scattered areas of chronic white matter ischemic change are seen. No findings to suggest acute hemorrhage, acute infarction or space-occupying mass lesion are seen. IMPRESSION: Chronic atrophic and ischemic changes. Large right frontal scalp hematoma. No other focal abnormality is noted. Electronically Signed   By: Inez Catalina M.D.   On: 02/05/2016 13:44   Dg Knee Complete 4 Views Right  02/05/2016  CLINICAL DATA:  Right knee pain after falling today. Initial encounter. EXAM: RIGHT KNEE - COMPLETE 4+ VIEW COMPARISON:  None. FINDINGS: The bones appear adequately mineralized for age. There is no evidence acute fracture or dislocation. There is no significant joint effusion. The joint spaces are relatively maintained. There is some soft tissue edema on the lateral  view anterior to the patellar tendon and proximal tibia. No foreign bodies or soft tissue emphysema seen. IMPRESSION: No acute osseous findings. Edema within the subcutaneous fat inferior to the patella, likely posttraumatic. Electronically Signed   By: Richardean Sale M.D.   On: 02/05/2016 13:54   Ct Maxillofacial Wo Cm  02/05/2016  CLINICAL DATA:  Recent fall with facial and head injury, initial encounter EXAM: CT HEAD WITHOUT CONTRAST TECHNIQUE: Contiguous axial images were obtained from the base of the skull through the vertex without intravenous contrast. COMPARISON:  04/29/2012 FINDINGS: The bony calvarium is intact. A large scalp hematoma is noted in the region of the right forehead. Mild atrophic changes are noted. Scattered areas of chronic white matter ischemic change are seen. No findings to suggest acute hemorrhage, acute infarction or  space-occupying mass lesion are seen. IMPRESSION: Chronic atrophic and ischemic changes. Large right frontal scalp hematoma. No other focal abnormality is noted. Electronically Signed   By: Inez Catalina M.D.   On: 02/05/2016 13:44     Ezequiel Essex, MD has personally reviewed and evaluated these images and lab results as part of his medical decision-making.  MDM   Final diagnoses:  Head injury, initial encounter  Mechanical fall with head injury and hematoma to the forehead. Patient is on Zoloft. She is alert and oriented 3 and denies any complaint of headache.  CT scan is negative for fracture or intracranial pathology.  Daughter at bedside states patient is more sleepy than usual. She is neurologically intact and oriented 3. She is able to ambulatory with assistance.  Discuss risk of delayed bleeding with anticoagulation.  Family feels patient is not at her baseline and is hesitant to take her home.  Family feels patient is becoming more "sleepy" and less interactive. She is however awake and alert on my evaluation and is alert and oriented 2. They're concerned about taking her home.  Repeat head CT is ordered and is unchanged. No delayed hemorrhage. Observation admission discussed with Dr. Jonnie Finner.  Addendum: CT C spine and face not seen in results. Only CT head.  D/w radiology who gave verbal report and no acute findings.  Radiology to attempt to cross report over. Dr. Jonnie Finner aware.  I personally performed the services described in this documentation, which was scribed in my presence. The recorded information has been reviewed and is accurate.      Ezequiel Essex, MD 02/05/16 1931  Ezequiel Essex, MD 02/06/16 [id number]

## 2016-02-05 NOTE — ED Notes (Signed)
Lab at bedside

## 2016-02-05 NOTE — H&P (Signed)
[facility] History and Physical  Kathryn Marquez [id number] DOB: 07/22/1921 DOA: 02/05/2016  Referring physician: Dr Wyvonnia Dusky PCP: Glo Herring., MD   Chief Complaint: Fall / facial trauma / confusion  HPI: Kathryn Marquez is a 82 y.o. female with hx of HTN, chronic afib fell walking into a restaurant today , fell on her face, tripped stepping up a curb.  No LOC. On xarelto.  Brought to ED. CT neg for bleed at 1:13, repeated at 4:18 pm because of some AMS , again neg for bleed.  Asked to see for OBS admission.    Is in no distress, calm, elderly.  Lives with her daughter, relatively active.  Was going out for lunch today when fell as above.    Hx afib looking back in chart.  Was on coumadin in the past, now on xarelto.  No recent illness, colds, diarrhea or bleeding. Pt denies any recent SOB, cough, chills, fever , abd pain, joint pain, HA.  Pain from fall.    ROS  denies CP  no joint pain   no HA  no blurry vision  no rash  no diarrhea  no nausea/ vomiting  no dysuria  no difficulty voiding  no change in urine color    Where does patient live home Can patient participate in ADLs? yes  Past Medical History  Past Medical History  Diagnosis Date  . Hypertension   . Acute embolic stroke ([place]) 123XX123  . Dyslipidemia, goal LDL below 100 04/30/2012   Past Surgical History History reviewed. No pertinent past surgical history. Family History  Family History  Problem Relation Age of Onset  . Heart attack Mother   . Stroke Other   . Leukemia Sister   . Cancer Brother   . Cancer Other    Social History  reports that she has never smoked. She does not have any smokeless tobacco history on file. She reports that she does not drink alcohol or use illicit drugs. Allergies No Known Allergies Home medications Prior to Admission medications   Medication Sig Start Date End Date Taking? Authorizing Provider  amLODipine (NORVASC) 5 MG tablet Take 5 mg by mouth every  morning.   Yes Historical Provider, MD  aspirin 81 MG chewable tablet Chew 2 tablets (162 mg total) by mouth every morning. Patient taking differently: Chew 81 mg by mouth every evening.  04/30/12  Yes Rexene Alberts, MD  cetirizine (ZYRTEC) 10 MG tablet Take 10 mg by mouth daily.   Yes Historical Provider, MD  hydrochlorothiazide (HYDRODIURIL) 25 MG tablet Take 25 mg by mouth daily as needed. For fluid   Yes Historical Provider, MD  metoprolol succinate (TOPROL-XL) 25 MG 24 hr tablet Take 1 tablet (25 mg total) by mouth daily. New blood pressure medicine. 04/30/12 02/05/16 Yes Rexene Alberts, MD  atorvastatin (LIPITOR) 10 MG tablet Take 0.5 tablets (5 mg total) by mouth at bedtime. New medicine to treat your elevated cholesterol. Patient not taking: Reported on 02/05/2016 04/30/12 02/05/16  Rexene Alberts, MD   Liver Function Tests No results for input(s): AST, ALT, ALKPHOS, BILITOT, PROT, ALBUMIN in the last 168 hours. No results for input(s): LIPASE, AMYLASE in the last 168 hours. CBC  Recent Labs Lab 02/05/16 1635  WBC 11.4*  NEUTROABS 9.4*  HGB 13.8  HCT 42.2  MCV 88.3  PLT 123456   Basic Metabolic Panel  Recent Labs Lab 02/05/16 1635  NA 139  K 3.4*  CL 98*  CO2 31  GLUCOSE 155*  BUN 19  CREATININE 0.71  CALCIUM 8.8*     Filed Vitals:   02/05/16 1915 02/05/16 1930 02/05/16 2000 02/05/16 2030  BP:   115/68 117/64  Pulse: 84 77 70 73  Temp:      TempSrc:      Resp: 19 18 20 20   Height:      Weight:      SpO2: 91% 93% 96% 93%   Exam: VS: BP 115/ 68 HR 73  93% RA Gen alert, major bruising R forehead and R periorbital, no sig laceration No rash, cyanosis or gangrene Sclera anicteric, throat clear  No jvd or bruits R eye closed from swelling/ bruising, pupils equal bilat, gaze conjugate Chest clear bilat RRR no MRG Abd soft ntnd no mass or ascites +bs GU defer MS no joint effusions or deformity Ext no LE or UE edema / no wounds or ulcers Neuro is alert, Ox 3 ,  mild/ mod RUE/ RLE weakness 3+/5 Oriented x 3 w coaching, plesaeant and alert   EKG (independently reviewed) > Afib rate, 78 bpm, no acute ST/ T chgs CXR negative / pelvix xrays - no fractures / R knee xray no fractures /  CT C-spine 1316 and repeated 1818 > old L parietal infarct,. No acute bleed Home medications > norvasc, asa, HCTZ, Toprol-XL, lipitor and "blood thinner", not sure which one though   Assessment: 1. Fall / facial trauma - mechanical fall, no syncope.  On xarelto. Admit for observation, prob repeat CT in am, hold xarelto for now. Tylenol for pain for now.  2. HTN cont meds 3. Afib - cont BB, hold xarelto 4. Hx CVA w R hemiparesis 5. DNR - as prior, per pt request, daughter confirms  Plan - as above   DVT Prophylaxis SCD   Code Status: DNR Family Communication: at bedside  Disposition Plan: home when better    Sol Blazing [facility] Pager [phone]  Cell [phone]  If 7PM-7AM, please contact night-coverage [email or web address] Password [facility] 02/05/2016, 9:23 PM

## 2016-02-06 ENCOUNTER — Ambulatory Visit (HOSPITAL_COMMUNITY): Payer: PPO

## 2016-02-06 ENCOUNTER — Encounter (HOSPITAL_COMMUNITY): Payer: Self-pay | Admitting: *Deleted

## 2016-02-06 DIAGNOSIS — I482 Chronic atrial fibrillation: Secondary | ICD-10-CM | POA: Diagnosis not present

## 2016-02-06 DIAGNOSIS — W101XXD Fall (on)(from) sidewalk curb, subsequent encounter: Secondary | ICD-10-CM | POA: Diagnosis not present

## 2016-02-06 DIAGNOSIS — S0083XA Contusion of other part of head, initial encounter: Secondary | ICD-10-CM | POA: Diagnosis not present

## 2016-02-06 MED ORDER — HYDROCHLOROTHIAZIDE 25 MG PO TABS
25.0000 mg | ORAL_TABLET | Freq: Every day | ORAL | Status: DC
  Administered 2016-02-06: 25 mg via ORAL
  Filled 2016-02-06: qty 1

## 2016-02-06 MED ORDER — ASPIRIN 81 MG PO CHEW: 81.0000 mg | CHEWABLE_TABLET | Freq: Every evening | ORAL | Status: DC

## 2016-02-06 MED ORDER — METOPROLOL SUCCINATE ER 25 MG PO TB24
25.0000 mg | ORAL_TABLET | Freq: Every day | ORAL | Status: DC
  Administered 2016-02-06: 25 mg via ORAL
  Filled 2016-02-06: qty 1

## 2016-02-06 MED ORDER — OXYCODONE HCL 5 MG PO TABS
2.5000 mg | ORAL_TABLET | ORAL | Status: DC | PRN
  Administered 2016-02-06 (×2): 2.5 mg via ORAL
  Filled 2016-02-06 (×2): qty 1

## 2016-02-06 MED ORDER — ACETAMINOPHEN 325 MG PO TABS: 650.0000 mg | ORAL_TABLET | Freq: Four times a day (QID) | ORAL | Status: DC | PRN

## 2016-02-06 MED ORDER — KCL IN DEXTROSE-NACL 20-5-0.45 MEQ/L-%-% IV SOLN
INTRAVENOUS | Status: DC
  Administered 2016-02-06: 01:00:00 via INTRAVENOUS

## 2016-02-06 MED ORDER — ONDANSETRON HCL 4 MG/2ML IJ SOLN: 4.0000 mg | Freq: Four times a day (QID) | INTRAMUSCULAR | Status: DC | PRN

## 2016-02-06 MED ORDER — SODIUM CHLORIDE 0.9% FLUSH
3.0000 mL | Freq: Two times a day (BID) | INTRAVENOUS | Status: DC
  Administered 2016-02-06: 3 mL via INTRAVENOUS

## 2016-02-06 MED ORDER — ACETAMINOPHEN 650 MG RE SUPP: 650.0000 mg | Freq: Four times a day (QID) | RECTAL | Status: DC | PRN

## 2016-02-06 MED ORDER — AMLODIPINE BESYLATE 5 MG PO TABS
5.0000 mg | ORAL_TABLET | ORAL | Status: DC
  Administered 2016-02-06: 5 mg via ORAL
  Filled 2016-02-06: qty 1

## 2016-02-06 MED ORDER — ONDANSETRON HCL 4 MG PO TABS: 4.0000 mg | ORAL_TABLET | Freq: Four times a day (QID) | ORAL | Status: DC | PRN

## 2016-02-06 NOTE — Progress Notes (Signed)
Patient with discharge orders. Discharge instructions given, patient and daughter verbalized understanding. Patient stable. Patient left in private vehicle with daughter.

## 2016-02-06 NOTE — Discharge Summary (Signed)
Physician Discharge Summary  Kathryn Marquez [id number] DOB: Aug 13, 1921 DOA: 02/05/2016  PCP: Glo Herring., MD  Admit date: 02/05/2016 Discharge date: 02/06/2016  Time spent: 45 minutes  Recommendations for Outpatient Follow-up:  -Will be discharged home today. -Advised to follow-up with primary care provider in 2 weeks.   Discharge Diagnoses:  Principal Problem:   Fall involving sidewalk curb Active Problems:   Hypertension   Chronic atrial fibrillation ([place])   Head injury   Facial bruising   Fall   Discharge Condition: Stable and improved  Filed Weights   02/05/16 1235  Weight: 72.576 kg (160 lb)    History of present illness:  As per Dr. Jonnie Finner on 3/27: Kathryn Marquez is a 82 y.o. female with hx of HTN, chronic afib fell walking into a restaurant today , fell on her face, tripped stepping up a curb. No LOC. On xarelto. Brought to ED. CT neg for bleed at 1:13, repeated at 4:18 pm because of some AMS , again neg for bleed. Asked to see for OBS admission.   Is in no distress, calm, elderly. Lives with her daughter, relatively active. Was going out for lunch today when fell as above.   Hx afib looking back in chart. Was on coumadin in the past, now on xarelto. No recent illness, colds, diarrhea or bleeding. Pt denies any recent SOB, cough, chills, fever , abd pain, joint pain, HA. Pain from fall.   Hospital Course:   Mechanical fall/facial trauma -No description of syncope, okay to resume Xarelto, when necessary Tylenol and ibuprofen as needed for pain and cold compresses for swelling. -Okay for discharge home today.  Breasts are chronic conditions are stable and home medications have not been altered.  Procedures:  None   Consultations:  None  Discharge Instructions  Discharge Instructions    Increase activity slowly    Complete by:  As directed             Medication List    TAKE these medications        amLODipine 5 MG tablet    Commonly known as:  NORVASC  Take 5 mg by mouth every morning.     aspirin 81 MG chewable tablet  Chew 2 tablets (162 mg total) by mouth every morning.     cetirizine 10 MG tablet  Commonly known as:  ZYRTEC  Take 10 mg by mouth daily.     hydrochlorothiazide 25 MG tablet  Commonly known as:  HYDRODIURIL  Take 25 mg by mouth daily as needed. For fluid     metoprolol succinate 25 MG 24 hr tablet  Commonly known as:  TOPROL-XL  Take 1 tablet (25 mg total) by mouth daily. New blood pressure medicine.       No Known Allergies     Follow-up Information    Follow up with Glo Herring., MD. Schedule an appointment as soon as possible for a visit in 2 weeks.   Specialty:  Internal Medicine   Contact information:   [address] [place] [id number] [phone]        The results of significant diagnostics from this hospitalization (including imaging, microbiology, ancillary and laboratory) are listed below for reference.    Significant Diagnostic Studies: Dg Chest 1 View  02/05/2016  CLINICAL DATA:  Fall, injury to forehead and face. EXAM: CHEST 1 VIEW COMPARISON:  Chest x-ray dated 04/28/2012. FINDINGS: Study is limited by acquisition technique, including over-penetration at the level of the mid  right lung. Grossly, lungs are clear. Lungs again appear hyperexpanded suggesting COPD. The pleural calcification along the left lateral chest wall, and associated chest wall deformity, is unchanged. Given the oblique patient positioning, cardiomediastinal silhouette is probably stable in size and configuration. Atherosclerotic changes again noted at the aortic arch. No acute-appearing osseous abnormality. IMPRESSION: Grossly, no acute findings.  Study limitations detailed above. Electronically Signed   By: Franki Cabot M.D.   On: 02/05/2016 13:58   Dg Pelvis 1-2 Views  02/05/2016  CLINICAL DATA:  Tripped over curb at restaurant and fell hitting forehead and face on ground,  RIGHT knee pain, pelvic pain, on Xarelto EXAM: PELVIS - 1-2 VIEW COMPARISON:  07/28/2010 FINDINGS: Diffuse osseous demineralization. Hip joint spaces minimally narrowed bilaterally. SI joints preserved. No acute fracture, dislocation or bone destruction. IMPRESSION: No acute osseous abnormalities. Osseous demineralization with minimal degenerative changes of BILATERAL hip joints. Electronically Signed   By: Lavonia Dana M.D.   On: 02/05/2016 13:59   Ct Head Wo Contrast  02/05/2016  CLINICAL DATA:  Change in mental status since earlier today, tripped over a curb and fell hitting forehead and face on the ground, denies loss of consciousness, on Xarelto EXAM: CT HEAD WITHOUT CONTRAST TECHNIQUE: Contiguous axial images were obtained from the base of the skull through the vertex without intravenous contrast. COMPARISON:  Repeat exam 1818 hours compared to 1316 hours CT head exam and correlated with prior MRI brain of 04/29/2012. FINDINGS: Generalized atrophy. Normal ventricular morphology. No midline shift or mass effect. Scattered small vessel chronic ischemic changes of deep cerebral white matter. Old appearing posterior LEFT parietal infarcts, question watershed infarct. No intracranial hemorrhage, mass lesion or evidence acute infarction. No definite extra-axial fluid collections. Large RIGHT frontal scalp hematoma extending periorbital. Osseous demineralization with scattered mucosal thickening in the maxillary sinuses. Atherosclerotic calcifications at skullbase. Calvaria intact. IMPRESSION: Atrophy with small vessel chronic ischemic changes of deep cerebral white matter and suspect old watershed infarct posterior LEFT parietal region, corresponding to areas of acute infarction on diffusion-weighted images from prior MR of 2013. No acute intracranial abnormalities. Electronically Signed   By: Lavonia Dana M.D.   On: 02/05/2016 18:42   Ct Head Wo Contrast  02/05/2016  CLINICAL DATA:  Recent fall with facial and  head injury, initial encounter EXAM: CT HEAD WITHOUT CONTRAST TECHNIQUE: Contiguous axial images were obtained from the base of the skull through the vertex without intravenous contrast. COMPARISON:  04/29/2012 FINDINGS: The bony calvarium is intact. A large scalp hematoma is noted in the region of the right forehead. Mild atrophic changes are noted. Scattered areas of chronic white matter ischemic change are seen. No findings to suggest acute hemorrhage, acute infarction or space-occupying mass lesion are seen. IMPRESSION: Chronic atrophic and ischemic changes. Large right frontal scalp hematoma. No other focal abnormality is noted. Electronically Signed   By: Inez Catalina M.D.   On: 02/05/2016 13:44   Ct Cervical Spine Wo Contrast  02/05/2016  CLINICAL DATA:  Recent fall with facial and head injury, initial encounter EXAM: CT HEAD WITHOUT CONTRAST TECHNIQUE: Contiguous axial images were obtained from the base of the skull through the vertex without intravenous contrast. COMPARISON:  04/29/2012 FINDINGS: The bony calvarium is intact. A large scalp hematoma is noted in the region of the right forehead. Mild atrophic changes are noted. Scattered areas of chronic white matter ischemic change are seen. No findings to suggest acute hemorrhage, acute infarction or space-occupying mass lesion are seen. IMPRESSION: Chronic atrophic and  ischemic changes. Large right frontal scalp hematoma. No other focal abnormality is noted. Electronically Signed   By: Inez Catalina M.D.   On: 02/05/2016 13:44   Dg Knee Complete 4 Views Right  02/05/2016  CLINICAL DATA:  Right knee pain after falling today. Initial encounter. EXAM: RIGHT KNEE - COMPLETE 4+ VIEW COMPARISON:  None. FINDINGS: The bones appear adequately mineralized for age. There is no evidence acute fracture or dislocation. There is no significant joint effusion. The joint spaces are relatively maintained. There is some soft tissue edema on the lateral view anterior to  the patellar tendon and proximal tibia. No foreign bodies or soft tissue emphysema seen. IMPRESSION: No acute osseous findings. Edema within the subcutaneous fat inferior to the patella, likely posttraumatic. Electronically Signed   By: Richardean Sale M.D.   On: 02/05/2016 13:54   Ct Maxillofacial Wo Cm  02/05/2016  CLINICAL DATA:  Recent fall with facial and head injury, initial encounter EXAM: CT HEAD WITHOUT CONTRAST TECHNIQUE: Contiguous axial images were obtained from the base of the skull through the vertex without intravenous contrast. COMPARISON:  04/29/2012 FINDINGS: The bony calvarium is intact. A large scalp hematoma is noted in the region of the right forehead. Mild atrophic changes are noted. Scattered areas of chronic white matter ischemic change are seen. No findings to suggest acute hemorrhage, acute infarction or space-occupying mass lesion are seen. IMPRESSION: Chronic atrophic and ischemic changes. Large right frontal scalp hematoma. No other focal abnormality is noted. Electronically Signed   By: Inez Catalina M.D.   On: 02/05/2016 13:44    Microbiology: No results found for this or any previous visit (from the past 240 hour(s)).   Labs: Basic Metabolic Panel:  Recent Labs Lab 02/05/16 1635  NA 139  K 3.4*  CL 98*  CO2 31  GLUCOSE 155*  BUN 19  CREATININE 0.71  CALCIUM 8.8*   Liver Function Tests: No results for input(s): AST, ALT, ALKPHOS, BILITOT, PROT, ALBUMIN in the last 168 hours. No results for input(s): LIPASE, AMYLASE in the last 168 hours. No results for input(s): AMMONIA in the last 168 hours. CBC:  Recent Labs Lab 02/05/16 1635  WBC 11.4*  NEUTROABS 9.4*  HGB 13.8  HCT 42.2  MCV 88.3  PLT 164   Cardiac Enzymes: No results for input(s): CKTOTAL, CKMB, CKMBINDEX, TROPONINI in the last 168 hours. BNP: BNP (last 3 results) No results for input(s): BNP in the last 8760 hours.  ProBNP (last 3 results) No results for input(s): PROBNP in the last  8760 hours.  CBG:  Recent Labs Lab 02/05/16 1444  GLUCAP 126*       Signed:  HERNANDEZ ACOSTA,ESTELA  [facility] Pager: [phone] 02/06/2016, 11:17 AM

## 2016-02-12 DIAGNOSIS — I4891 Unspecified atrial fibrillation: Secondary | ICD-10-CM | POA: Diagnosis not present

## 2016-02-12 DIAGNOSIS — S0083XD Contusion of other part of head, subsequent encounter: Secondary | ICD-10-CM | POA: Diagnosis not present

## 2016-02-12 DIAGNOSIS — E663 Overweight: Secondary | ICD-10-CM | POA: Diagnosis not present

## 2016-02-12 DIAGNOSIS — Z6828 Body mass index (BMI) 28.0-28.9, adult: Secondary | ICD-10-CM | POA: Diagnosis not present

## 2016-02-12 DIAGNOSIS — Z7901 Long term (current) use of anticoagulants: Secondary | ICD-10-CM | POA: Diagnosis not present

## 2016-02-12 DIAGNOSIS — Z1389 Encounter for screening for other disorder: Secondary | ICD-10-CM | POA: Diagnosis not present

## 2016-02-12 DIAGNOSIS — S0990XD Unspecified injury of head, subsequent encounter: Secondary | ICD-10-CM | POA: Diagnosis not present

## 2016-02-12 DIAGNOSIS — I10 Essential (primary) hypertension: Secondary | ICD-10-CM | POA: Diagnosis not present

## 2016-02-19 DIAGNOSIS — E663 Overweight: Secondary | ICD-10-CM | POA: Diagnosis not present

## 2016-02-19 DIAGNOSIS — I4891 Unspecified atrial fibrillation: Secondary | ICD-10-CM | POA: Diagnosis not present

## 2016-02-19 DIAGNOSIS — Z6828 Body mass index (BMI) 28.0-28.9, adult: Secondary | ICD-10-CM | POA: Diagnosis not present

## 2016-02-19 DIAGNOSIS — S0003XS Contusion of scalp, sequela: Secondary | ICD-10-CM | POA: Diagnosis not present

## 2016-02-19 DIAGNOSIS — I10 Essential (primary) hypertension: Secondary | ICD-10-CM | POA: Diagnosis not present

## 2016-02-19 DIAGNOSIS — Z1389 Encounter for screening for other disorder: Secondary | ICD-10-CM | POA: Diagnosis not present

## 2016-02-19 DIAGNOSIS — R58 Hemorrhage, not elsewhere classified: Secondary | ICD-10-CM | POA: Diagnosis not present

## 2016-03-12 DIAGNOSIS — S0083XA Contusion of other part of head, initial encounter: Secondary | ICD-10-CM | POA: Diagnosis not present

## 2016-06-04 DIAGNOSIS — H353213 Exudative age-related macular degeneration, right eye, with inactive scar: Secondary | ICD-10-CM | POA: Diagnosis not present

## 2016-06-04 DIAGNOSIS — Z961 Presence of intraocular lens: Secondary | ICD-10-CM | POA: Diagnosis not present

## 2016-06-04 DIAGNOSIS — H524 Presbyopia: Secondary | ICD-10-CM | POA: Diagnosis not present

## 2016-06-04 DIAGNOSIS — H353121 Nonexudative age-related macular degeneration, left eye, early dry stage: Secondary | ICD-10-CM | POA: Diagnosis not present

## 2016-08-27 DIAGNOSIS — Z85828 Personal history of other malignant neoplasm of skin: Secondary | ICD-10-CM | POA: Diagnosis not present

## 2016-08-27 DIAGNOSIS — L570 Actinic keratosis: Secondary | ICD-10-CM | POA: Diagnosis not present

## 2016-08-27 DIAGNOSIS — D485 Neoplasm of uncertain behavior of skin: Secondary | ICD-10-CM | POA: Diagnosis not present

## 2016-08-27 DIAGNOSIS — C44321 Squamous cell carcinoma of skin of nose: Secondary | ICD-10-CM | POA: Diagnosis not present

## 2016-09-26 DIAGNOSIS — C44311 Basal cell carcinoma of skin of nose: Secondary | ICD-10-CM | POA: Diagnosis not present

## 2016-10-21 DIAGNOSIS — E663 Overweight: Secondary | ICD-10-CM | POA: Diagnosis not present

## 2016-10-21 DIAGNOSIS — M810 Age-related osteoporosis without current pathological fracture: Secondary | ICD-10-CM | POA: Diagnosis not present

## 2016-10-21 DIAGNOSIS — Z1389 Encounter for screening for other disorder: Secondary | ICD-10-CM | POA: Diagnosis not present

## 2016-10-21 DIAGNOSIS — E538 Deficiency of other specified B group vitamins: Secondary | ICD-10-CM | POA: Diagnosis not present

## 2016-10-21 DIAGNOSIS — Z79899 Other long term (current) drug therapy: Secondary | ICD-10-CM | POA: Diagnosis not present

## 2016-10-21 DIAGNOSIS — Z6827 Body mass index (BMI) 27.0-27.9, adult: Secondary | ICD-10-CM | POA: Diagnosis not present

## 2016-10-21 DIAGNOSIS — R7301 Impaired fasting glucose: Secondary | ICD-10-CM | POA: Diagnosis not present

## 2016-10-21 DIAGNOSIS — I4891 Unspecified atrial fibrillation: Secondary | ICD-10-CM | POA: Diagnosis not present

## 2016-10-21 DIAGNOSIS — I10 Essential (primary) hypertension: Secondary | ICD-10-CM | POA: Diagnosis not present

## 2016-10-21 DIAGNOSIS — I6340 Cerebral infarction due to embolism of unspecified cerebral artery: Secondary | ICD-10-CM | POA: Diagnosis not present

## 2017-03-03 DIAGNOSIS — Z85828 Personal history of other malignant neoplasm of skin: Secondary | ICD-10-CM | POA: Diagnosis not present

## 2017-03-03 DIAGNOSIS — L570 Actinic keratosis: Secondary | ICD-10-CM | POA: Diagnosis not present

## 2017-03-12 DIAGNOSIS — I6340 Cerebral infarction due to embolism of unspecified cerebral artery: Secondary | ICD-10-CM | POA: Diagnosis not present

## 2017-03-12 DIAGNOSIS — Z0000 Encounter for general adult medical examination without abnormal findings: Secondary | ICD-10-CM | POA: Diagnosis not present

## 2017-03-12 DIAGNOSIS — Z6827 Body mass index (BMI) 27.0-27.9, adult: Secondary | ICD-10-CM | POA: Diagnosis not present

## 2017-03-12 DIAGNOSIS — E663 Overweight: Secondary | ICD-10-CM | POA: Diagnosis not present

## 2017-08-26 DIAGNOSIS — L710 Perioral dermatitis: Secondary | ICD-10-CM | POA: Diagnosis not present

## 2017-08-26 DIAGNOSIS — Z85828 Personal history of other malignant neoplasm of skin: Secondary | ICD-10-CM | POA: Diagnosis not present

## 2017-10-07 DIAGNOSIS — N342 Other urethritis: Secondary | ICD-10-CM | POA: Diagnosis not present

## 2017-10-07 DIAGNOSIS — E663 Overweight: Secondary | ICD-10-CM | POA: Diagnosis not present

## 2017-10-07 DIAGNOSIS — Z6827 Body mass index (BMI) 27.0-27.9, adult: Secondary | ICD-10-CM | POA: Diagnosis not present

## 2017-10-07 DIAGNOSIS — Z1389 Encounter for screening for other disorder: Secondary | ICD-10-CM | POA: Diagnosis not present

## 2018-01-08 DIAGNOSIS — R1310 Dysphagia, unspecified: Secondary | ICD-10-CM | POA: Diagnosis not present

## 2018-01-08 DIAGNOSIS — R109 Unspecified abdominal pain: Secondary | ICD-10-CM | POA: Diagnosis not present

## 2018-01-08 DIAGNOSIS — Z1389 Encounter for screening for other disorder: Secondary | ICD-10-CM | POA: Diagnosis not present

## 2018-01-08 DIAGNOSIS — I6340 Cerebral infarction due to embolism of unspecified cerebral artery: Secondary | ICD-10-CM | POA: Diagnosis not present

## 2018-01-08 DIAGNOSIS — I10 Essential (primary) hypertension: Secondary | ICD-10-CM | POA: Diagnosis not present

## 2018-01-08 DIAGNOSIS — N393 Stress incontinence (female) (male): Secondary | ICD-10-CM | POA: Diagnosis not present

## 2018-01-08 DIAGNOSIS — E663 Overweight: Secondary | ICD-10-CM | POA: Diagnosis not present

## 2018-01-08 DIAGNOSIS — R483 Visual agnosia: Secondary | ICD-10-CM | POA: Diagnosis not present

## 2018-01-08 DIAGNOSIS — Z6827 Body mass index (BMI) 27.0-27.9, adult: Secondary | ICD-10-CM | POA: Diagnosis not present

## 2018-01-08 DIAGNOSIS — N342 Other urethritis: Secondary | ICD-10-CM | POA: Diagnosis not present

## 2018-01-08 DIAGNOSIS — M79605 Pain in left leg: Secondary | ICD-10-CM | POA: Diagnosis not present

## 2018-03-09 DIAGNOSIS — Z6826 Body mass index (BMI) 26.0-26.9, adult: Secondary | ICD-10-CM | POA: Diagnosis not present

## 2018-03-09 DIAGNOSIS — E663 Overweight: Secondary | ICD-10-CM | POA: Diagnosis not present

## 2018-03-09 DIAGNOSIS — J069 Acute upper respiratory infection, unspecified: Secondary | ICD-10-CM | POA: Diagnosis not present

## 2018-06-10 DIAGNOSIS — I10 Essential (primary) hypertension: Secondary | ICD-10-CM | POA: Diagnosis not present

## 2018-06-10 DIAGNOSIS — R600 Localized edema: Secondary | ICD-10-CM | POA: Diagnosis not present

## 2018-06-10 DIAGNOSIS — Z0001 Encounter for general adult medical examination with abnormal findings: Secondary | ICD-10-CM | POA: Diagnosis not present

## 2018-06-10 DIAGNOSIS — E782 Mixed hyperlipidemia: Secondary | ICD-10-CM | POA: Diagnosis not present

## 2018-06-10 DIAGNOSIS — E663 Overweight: Secondary | ICD-10-CM | POA: Diagnosis not present

## 2018-06-10 DIAGNOSIS — Z6826 Body mass index (BMI) 26.0-26.9, adult: Secondary | ICD-10-CM | POA: Diagnosis not present

## 2018-08-26 DIAGNOSIS — Z85828 Personal history of other malignant neoplasm of skin: Secondary | ICD-10-CM | POA: Diagnosis not present

## 2018-08-26 DIAGNOSIS — L570 Actinic keratosis: Secondary | ICD-10-CM | POA: Diagnosis not present

## 2018-08-26 DIAGNOSIS — L821 Other seborrheic keratosis: Secondary | ICD-10-CM | POA: Diagnosis not present

## 2018-08-27 ENCOUNTER — Ambulatory Visit (HOSPITAL_COMMUNITY)
Admission: RE | Admit: 2018-08-27 | Discharge: 2018-08-27 | Disposition: A | Discharge status: Home / Self Care | Payer: PPO | Source: Ambulatory Visit | Attending: Internal Medicine | Admitting: Internal Medicine

## 2018-08-27 ENCOUNTER — Other Ambulatory Visit (HOSPITAL_COMMUNITY): Payer: Self-pay | Admitting: Internal Medicine

## 2018-08-27 DIAGNOSIS — I10 Essential (primary) hypertension: Secondary | ICD-10-CM | POA: Diagnosis not present

## 2018-08-27 DIAGNOSIS — J189 Pneumonia, unspecified organism: Secondary | ICD-10-CM | POA: Diagnosis not present

## 2018-08-27 DIAGNOSIS — J984 Other disorders of lung: Secondary | ICD-10-CM | POA: Diagnosis not present

## 2018-08-27 DIAGNOSIS — R05 Cough: Secondary | ICD-10-CM

## 2018-08-27 DIAGNOSIS — Z1389 Encounter for screening for other disorder: Secondary | ICD-10-CM | POA: Diagnosis not present

## 2018-08-27 DIAGNOSIS — J9801 Acute bronchospasm: Secondary | ICD-10-CM | POA: Diagnosis not present

## 2018-08-27 DIAGNOSIS — R059 Cough, unspecified: Secondary | ICD-10-CM

## 2018-08-27 DIAGNOSIS — E663 Overweight: Secondary | ICD-10-CM | POA: Diagnosis not present

## 2018-08-27 DIAGNOSIS — E782 Mixed hyperlipidemia: Secondary | ICD-10-CM | POA: Diagnosis not present

## 2018-08-27 DIAGNOSIS — J329 Chronic sinusitis, unspecified: Secondary | ICD-10-CM | POA: Diagnosis not present

## 2018-08-27 DIAGNOSIS — Z6826 Body mass index (BMI) 26.0-26.9, adult: Secondary | ICD-10-CM | POA: Diagnosis not present

## 2019-02-16 DIAGNOSIS — Z9049 Acquired absence of other specified parts of digestive tract: Secondary | ICD-10-CM | POA: Diagnosis not present

## 2019-02-16 DIAGNOSIS — Z888 Allergy status to other drugs, medicaments and biological substances status: Secondary | ICD-10-CM | POA: Diagnosis not present

## 2019-02-16 DIAGNOSIS — E079 Disorder of thyroid, unspecified: Secondary | ICD-10-CM | POA: Diagnosis not present

## 2019-02-16 DIAGNOSIS — I4819 Other persistent atrial fibrillation: Secondary | ICD-10-CM | POA: Diagnosis not present

## 2019-02-16 DIAGNOSIS — N390 Urinary tract infection, site not specified: Secondary | ICD-10-CM | POA: Diagnosis not present

## 2019-02-16 DIAGNOSIS — Z79899 Other long term (current) drug therapy: Secondary | ICD-10-CM | POA: Diagnosis not present

## 2019-02-16 DIAGNOSIS — Z7901 Long term (current) use of anticoagulants: Secondary | ICD-10-CM | POA: Diagnosis not present

## 2019-02-16 DIAGNOSIS — Z681 Body mass index (BMI) 19 or less, adult: Secondary | ICD-10-CM | POA: Diagnosis not present

## 2019-02-16 DIAGNOSIS — D649 Anemia, unspecified: Secondary | ICD-10-CM | POA: Diagnosis not present

## 2019-02-16 DIAGNOSIS — Z0001 Encounter for general adult medical examination with abnormal findings: Secondary | ICD-10-CM | POA: Diagnosis not present

## 2019-02-16 DIAGNOSIS — Z0000 Encounter for general adult medical examination without abnormal findings: Secondary | ICD-10-CM | POA: Diagnosis not present

## 2019-02-16 DIAGNOSIS — Z803 Family history of malignant neoplasm of breast: Secondary | ICD-10-CM | POA: Diagnosis not present

## 2019-02-16 DIAGNOSIS — E669 Obesity, unspecified: Secondary | ICD-10-CM | POA: Diagnosis not present

## 2019-02-16 DIAGNOSIS — E785 Hyperlipidemia, unspecified: Secondary | ICD-10-CM | POA: Diagnosis not present

## 2019-02-16 DIAGNOSIS — S3993XA Unspecified injury of pelvis, initial encounter: Secondary | ICD-10-CM | POA: Diagnosis not present

## 2019-02-16 DIAGNOSIS — Z79890 Hormone replacement therapy (postmenopausal): Secondary | ICD-10-CM | POA: Diagnosis not present

## 2019-02-16 DIAGNOSIS — R9431 Abnormal electrocardiogram [ECG] [EKG]: Secondary | ICD-10-CM | POA: Diagnosis not present

## 2019-02-16 DIAGNOSIS — Z8249 Family history of ischemic heart disease and other diseases of the circulatory system: Secondary | ICD-10-CM | POA: Diagnosis not present

## 2019-02-16 DIAGNOSIS — Z6841 Body Mass Index (BMI) 40.0 and over, adult: Secondary | ICD-10-CM | POA: Diagnosis not present

## 2019-02-16 DIAGNOSIS — I10 Essential (primary) hypertension: Secondary | ICD-10-CM | POA: Diagnosis not present

## 2019-02-16 DIAGNOSIS — I6340 Cerebral infarction due to embolism of unspecified cerebral artery: Secondary | ICD-10-CM | POA: Diagnosis not present

## 2019-02-16 DIAGNOSIS — M4326 Fusion of spine, lumbar region: Secondary | ICD-10-CM | POA: Diagnosis not present

## 2019-02-16 DIAGNOSIS — Z1389 Encounter for screening for other disorder: Secondary | ICD-10-CM | POA: Diagnosis not present

## 2019-02-16 DIAGNOSIS — M1990 Unspecified osteoarthritis, unspecified site: Secondary | ICD-10-CM | POA: Diagnosis not present

## 2019-02-16 DIAGNOSIS — K661 Hemoperitoneum: Secondary | ICD-10-CM | POA: Diagnosis not present

## 2019-08-30 DIAGNOSIS — D0439 Carcinoma in situ of skin of other parts of face: Secondary | ICD-10-CM | POA: Diagnosis not present

## 2019-08-30 DIAGNOSIS — C44321 Squamous cell carcinoma of skin of nose: Secondary | ICD-10-CM | POA: Diagnosis not present

## 2019-08-30 DIAGNOSIS — L570 Actinic keratosis: Secondary | ICD-10-CM | POA: Diagnosis not present

## 2020-02-09 DIAGNOSIS — I4891 Unspecified atrial fibrillation: Secondary | ICD-10-CM | POA: Diagnosis not present

## 2020-02-09 DIAGNOSIS — E7849 Other hyperlipidemia: Secondary | ICD-10-CM | POA: Diagnosis not present

## 2020-02-09 DIAGNOSIS — I10 Essential (primary) hypertension: Secondary | ICD-10-CM | POA: Diagnosis not present

## 2020-04-05 ENCOUNTER — Other Ambulatory Visit: Payer: Self-pay

## 2020-04-05 ENCOUNTER — Other Ambulatory Visit (HOSPITAL_COMMUNITY): Payer: Self-pay | Admitting: Internal Medicine

## 2020-04-05 ENCOUNTER — Ambulatory Visit (HOSPITAL_COMMUNITY)
Admission: RE | Admit: 2020-04-05 | Discharge: 2020-04-05 | Disposition: A | Discharge status: Home / Self Care | Payer: PPO | Source: Ambulatory Visit | Attending: Internal Medicine | Admitting: Internal Medicine

## 2020-04-05 DIAGNOSIS — I10 Essential (primary) hypertension: Secondary | ICD-10-CM | POA: Diagnosis not present

## 2020-04-05 DIAGNOSIS — I4891 Unspecified atrial fibrillation: Secondary | ICD-10-CM | POA: Diagnosis not present

## 2020-04-05 DIAGNOSIS — R059 Cough, unspecified: Secondary | ICD-10-CM

## 2020-04-05 DIAGNOSIS — R05 Cough: Secondary | ICD-10-CM | POA: Diagnosis not present

## 2020-04-05 DIAGNOSIS — J329 Chronic sinusitis, unspecified: Secondary | ICD-10-CM | POA: Diagnosis not present

## 2020-04-05 DIAGNOSIS — Z681 Body mass index (BMI) 19 or less, adult: Secondary | ICD-10-CM | POA: Diagnosis not present

## 2020-04-13 ENCOUNTER — Emergency Department (HOSPITAL_COMMUNITY): Payer: PPO

## 2020-04-13 ENCOUNTER — Other Ambulatory Visit: Payer: Self-pay

## 2020-04-13 ENCOUNTER — Inpatient Hospital Stay (HOSPITAL_COMMUNITY)
Admission: EM | Admit: 2020-04-13 | Discharge: 2020-04-15 | DRG: 372 | Disposition: A | Discharge status: Home Health | Payer: PPO | Attending: Internal Medicine | Admitting: Internal Medicine

## 2020-04-13 ENCOUNTER — Encounter (HOSPITAL_COMMUNITY): Payer: Self-pay | Admitting: Emergency Medicine

## 2020-04-13 DIAGNOSIS — Z7901 Long term (current) use of anticoagulants: Secondary | ICD-10-CM | POA: Diagnosis not present

## 2020-04-13 DIAGNOSIS — Z823 Family history of stroke: Secondary | ICD-10-CM

## 2020-04-13 DIAGNOSIS — K5792 Diverticulitis of intestine, part unspecified, without perforation or abscess without bleeding: Secondary | ICD-10-CM | POA: Diagnosis present

## 2020-04-13 DIAGNOSIS — R109 Unspecified abdominal pain: Secondary | ICD-10-CM | POA: Diagnosis not present

## 2020-04-13 DIAGNOSIS — Z8673 Personal history of transient ischemic attack (TIA), and cerebral infarction without residual deficits: Secondary | ICD-10-CM

## 2020-04-13 DIAGNOSIS — I10 Essential (primary) hypertension: Secondary | ICD-10-CM | POA: Diagnosis present

## 2020-04-13 DIAGNOSIS — K5732 Diverticulitis of large intestine without perforation or abscess without bleeding: Secondary | ICD-10-CM | POA: Diagnosis present

## 2020-04-13 DIAGNOSIS — R197 Diarrhea, unspecified: Secondary | ICD-10-CM | POA: Diagnosis not present

## 2020-04-13 DIAGNOSIS — Z806 Family history of leukemia: Secondary | ICD-10-CM

## 2020-04-13 DIAGNOSIS — R0602 Shortness of breath: Secondary | ICD-10-CM | POA: Diagnosis not present

## 2020-04-13 DIAGNOSIS — A0472 Enterocolitis due to Clostridium difficile, not specified as recurrent: Principal | ICD-10-CM

## 2020-04-13 DIAGNOSIS — Z66 Do not resuscitate: Secondary | ICD-10-CM | POA: Diagnosis not present

## 2020-04-13 DIAGNOSIS — I482 Chronic atrial fibrillation, unspecified: Secondary | ICD-10-CM | POA: Diagnosis present

## 2020-04-13 DIAGNOSIS — Z79899 Other long term (current) drug therapy: Secondary | ICD-10-CM

## 2020-04-13 DIAGNOSIS — E785 Hyperlipidemia, unspecified: Secondary | ICD-10-CM | POA: Diagnosis not present

## 2020-04-13 DIAGNOSIS — Z20822 Contact with and (suspected) exposure to covid-19: Secondary | ICD-10-CM | POA: Diagnosis not present

## 2020-04-13 DIAGNOSIS — E876 Hypokalemia: Secondary | ICD-10-CM | POA: Diagnosis not present

## 2020-04-13 DIAGNOSIS — Z7982 Long term (current) use of aspirin: Secondary | ICD-10-CM

## 2020-04-13 DIAGNOSIS — Z8249 Family history of ischemic heart disease and other diseases of the circulatory system: Secondary | ICD-10-CM | POA: Diagnosis not present

## 2020-04-13 DIAGNOSIS — D696 Thrombocytopenia, unspecified: Secondary | ICD-10-CM | POA: Diagnosis not present

## 2020-04-13 DIAGNOSIS — I4891 Unspecified atrial fibrillation: Secondary | ICD-10-CM | POA: Diagnosis not present

## 2020-04-13 LAB — URINALYSIS, ROUTINE W REFLEX MICROSCOPIC
Bacteria, UA: NONE SEEN
Bilirubin Urine: NEGATIVE
Glucose, UA: NEGATIVE mg/dL
Ketones, ur: 5 mg/dL — AB
Leukocytes,Ua: NEGATIVE
Nitrite: NEGATIVE
Protein, ur: 30 mg/dL — AB
Specific Gravity, Urine: 1.023 (ref 1.005–1.030)
pH: 5 (ref 5.0–8.0)

## 2020-04-13 LAB — COMPREHENSIVE METABOLIC PANEL
ALT: 15 U/L (ref 0–44)
AST: 19 U/L (ref 15–41)
Albumin: 3.9 g/dL (ref 3.5–5.0)
Alkaline Phosphatase: 61 U/L (ref 38–126)
Anion gap: 11 (ref 5–15)
BUN: 16 mg/dL (ref 8–23)
CO2: 28 mmol/L (ref 22–32)
Calcium: 8.9 mg/dL (ref 8.9–10.3)
Chloride: 94 mmol/L — ABNORMAL LOW (ref 98–111)
Creatinine, Ser: 0.62 mg/dL (ref 0.44–1.00)
GFR calc Af Amer: >60 mL/min (ref >60)
GFR calc non Af Amer: >60 mL/min (ref >60)
Glucose, Bld: 118 mg/dL — ABNORMAL HIGH (ref 70–99)
Potassium: 3.2 mmol/L — ABNORMAL LOW (ref 3.5–5.1)
Sodium: 133 mmol/L — ABNORMAL LOW (ref 135–145)
Total Bilirubin: 1.9 mg/dL — ABNORMAL HIGH (ref 0.3–1.2)
Total Protein: 7 g/dL (ref 6.5–8.1)

## 2020-04-13 LAB — CBC
HCT: 43.4 % (ref 36.0–46.0)
Hemoglobin: 14.1 g/dL (ref 12.0–15.0)
MCH: 28.9 pg (ref 26.0–34.0)
MCHC: 32.5 g/dL (ref 30.0–36.0)
MCV: 88.9 fL (ref 80.0–100.0)
Platelets: 147 10*3/uL — ABNORMAL LOW (ref 150–400)
RBC: 4.88 MIL/uL (ref 3.87–5.11)
RDW: 14.6 % (ref 11.5–15.5)
WBC: 16.4 10*3/uL — ABNORMAL HIGH (ref 4.0–10.5)
nRBC: 0 % (ref 0.0–0.2)

## 2020-04-13 LAB — LIPASE, BLOOD: Lipase: 19 U/L (ref 11–51)

## 2020-04-13 MED ORDER — METRONIDAZOLE IN NACL 5-0.79 MG/ML-% IV SOLN
500.0000 mg | Freq: Once | INTRAVENOUS | Status: AC
  Administered 2020-04-14: 500 mg via INTRAVENOUS
  Filled 2020-04-13: qty 100

## 2020-04-13 MED ORDER — SODIUM CHLORIDE 0.9% FLUSH: 3.0000 mL | Freq: Once | INTRAVENOUS | Status: DC

## 2020-04-13 MED ORDER — CIPROFLOXACIN IN D5W 400 MG/200ML IV SOLN
400.0000 mg | Freq: Once | INTRAVENOUS | Status: AC
  Administered 2020-04-14: 400 mg via INTRAVENOUS
  Filled 2020-04-13: qty 200

## 2020-04-13 MED ORDER — SODIUM CHLORIDE 0.9 % IV BOLUS
500.0000 mL | Freq: Once | INTRAVENOUS | Status: AC
  Administered 2020-04-13: 500 mL via INTRAVENOUS

## 2020-04-13 MED ORDER — POTASSIUM CHLORIDE CRYS ER 20 MEQ PO TBCR
40.0000 meq | EXTENDED_RELEASE_TABLET | Freq: Once | ORAL | Status: DC
  Filled 2020-04-13: qty 2

## 2020-04-13 MED ORDER — POTASSIUM CHLORIDE 20 MEQ PO PACK
40.0000 meq | PACK | Freq: Once | ORAL | Status: AC
  Administered 2020-04-13: 40 meq via ORAL
  Filled 2020-04-13: qty 2

## 2020-04-13 MED ORDER — IOHEXOL 300 MG/ML  SOLN
100.0000 mL | Freq: Once | INTRAMUSCULAR | Status: AC | PRN
  Administered 2020-04-13: 100 mL via INTRAVENOUS

## 2020-04-13 NOTE — ED Provider Notes (Signed)
[facility] EMERGENCY DEPARTMENT Provider Note   CSN: [id number] Arrival date & time: 04/13/20  1656     History Chief Complaint  Patient presents with  . Weakness    Kathryn Marquez is a 86 y.o. female.  Patient complains of diarrhea and abdominal discomfort  The history is provided by the patient. No language interpreter was used.  Weakness Severity:  Moderate Onset quality:  Sudden Timing:  Constant Progression:  Worsening Chronicity:  New Context: not alcohol use   Relieved by:  Nothing Worsened by:  Nothing Ineffective treatments:  None tried Associated symptoms: abdominal pain and diarrhea   Associated symptoms: no chest pain, no cough, no frequency, no headaches and no seizures        Past Medical History:  Diagnosis Date  . Acute embolic stroke ([place]) 123XX123  . Dyslipidemia, goal LDL below 100 04/30/2012  . Dysrhythmia    Chronic atrial fib  . Hypertension     Patient Active Problem List   Diagnosis Date Noted  . Head injury 02/05/2016  . Fall involving sidewalk curb 02/05/2016  . Facial trauma 02/05/2016  . Facial bruising 02/05/2016  . Fall 02/05/2016  . Dyslipidemia, goal LDL below 100 04/30/2012  . TIA (transient ischemic attack) 04/29/2012  . 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 Hypertension 04/29/2012  . Chronic atrial fibrillation ([place]) 04/29/2012  . CLOSED COLLES FRACTURE 08/02/2010    History reviewed. No pertinent surgical history.   OB History   No obstetric history on file.     Family History  Problem Relation Age of Onset  . Stroke Other   . Cancer Other   . Heart attack Mother   . Leukemia Sister   . Cancer Brother     Social History   Tobacco Use  . Smoking status: Never Smoker  . Smokeless tobacco: Never Used  Substance Use Topics  . Alcohol use: No  . Drug use: No    Home Medications Prior to Admission medications   Medication Sig Start Date End Date Taking? Authorizing Provider    amLODipine (NORVASC) 5 MG tablet Take 5 mg by mouth every morning.   Yes [provider]  amoxicillin-clavulanate (AUGMENTIN) 875-125 MG tablet Take 1 tablet by mouth 2 (two) times daily. 10 day course starting on 04/05/2020 04/05/20  Yes [provider]  aspirin 81 MG chewable tablet Chew 2 tablets (162 mg total) by mouth every morning. Patient taking differently: Chew 81 mg by mouth every evening.  04/30/12  Yes Rexene Alberts, MD  cetirizine (ZYRTEC) 10 MG tablet Take 10 mg by mouth daily.   Yes [provider]  diphenoxylate-atropine (LOMOTIL) 2.5-0.025 MG tablet Take 2 tablets by mouth 4 (four) times daily as needed for diarrhea or loose stools.  04/12/20  Yes [provider]  hydrochlorothiazide (HYDRODIURIL) 25 MG tablet Take 25 mg by mouth daily as needed. For fluid   Yes [provider]  metoprolol succinate (TOPROL-XL) 25 MG 24 hr tablet Take 1 tablet (25 mg total) by mouth daily. New blood pressure medicine. 04/30/12 04/13/20 Yes Rexene Alberts, MD  XARELTO 15 MG TABS tablet Take 15 mg by mouth daily. 12/14/19  Yes [provider]    Allergies    Patient has no known allergies.  Review of Systems   Review of Systems  Constitutional: Negative for appetite change and fatigue.  HENT: Negative for congestion, ear discharge and sinus pressure.   Eyes: Negative for discharge.  Respiratory:  Negative for cough.   Cardiovascular: Negative for chest pain.  Gastrointestinal: Positive for abdominal pain and diarrhea.  Genitourinary: Negative for frequency and hematuria.  Musculoskeletal: Negative for back pain.  Skin: Negative for rash.  Neurological: Positive for weakness. Negative for seizures and headaches.  Psychiatric/Behavioral: Negative for hallucinations.    Physical Exam Updated Vital Signs BP 138/72 (BP Location: Right Arm)   Pulse 90   Temp 98.2 F (36.8 C) (Oral)   Resp (!) 23   Ht 5\' 3"  (1.6 m)   Wt 63.5 kg   SpO2 97%    BMI 24.80 kg/m   Physical Exam Vitals and nursing note reviewed.  Constitutional:      Appearance: She is well-developed.  HENT:     Head: Normocephalic.     Right Ear: External ear normal.  Eyes:     General: No scleral icterus.    Conjunctiva/sclera: Conjunctivae normal.  Neck:     Thyroid: No thyromegaly.  Cardiovascular:     Rate and Rhythm: Normal rate and regular rhythm.     Heart sounds: No murmur. No friction rub. No gallop.   Pulmonary:     Breath sounds: No stridor. No wheezing or rales.  Chest:     Chest wall: No tenderness.  Abdominal:     General: There is no distension.     Tenderness: There is abdominal tenderness. There is no rebound.  Musculoskeletal:        General: Normal range of motion.     Cervical back: Neck supple.  Lymphadenopathy:     Cervical: No cervical adenopathy.  Skin:    Findings: No erythema or rash.  Neurological:     Mental Status: She is alert and oriented to person, place, and time.     Motor: No abnormal muscle tone.     Coordination: Coordination normal.  Psychiatric:        Behavior: Behavior normal.     ED Results / Procedures / Treatments   Labs (all labs ordered are listed, but only abnormal results are displayed) Labs Reviewed  COMPREHENSIVE METABOLIC PANEL - Abnormal; Notable for the following components:      Result Value   Sodium 133 (*)    Potassium 3.2 (*)    Chloride 94 (*)    Glucose, Bld 118 (*)    Total Bilirubin 1.9 (*)    All other components within normal limits  CBC - Abnormal; Notable for the following components:   WBC 16.4 (*)    Platelets 147 (*)    All other components within normal limits  URINALYSIS, ROUTINE W REFLEX MICROSCOPIC - Abnormal; Notable for the following components:   Color, Urine AMBER (*)    Hgb urine dipstick MODERATE (*)    Ketones, ur 5 (*)    Protein, ur 30 (*)    All other components within normal limits  C DIFFICILE QUICK SCREEN W PCR REFLEX  SARS CORONAVIRUS 2 BY RT  PCR (HOSPITAL ORDER, [place] [facility])  LIPASE, BLOOD    EKG None  Radiology CT ABDOMEN PELVIS W CONTRAST  Result Date: 04/13/2020 CLINICAL DATA:  Acute abdominal pain EXAM: CT ABDOMEN AND PELVIS WITH CONTRAST TECHNIQUE: Multidetector CT imaging of the abdomen and pelvis was performed using the standard protocol following bolus administration of intravenous contrast. CONTRAST:  172mL OMNIPAQUE IOHEXOL 300 MG/ML  SOLN COMPARISON:  None. FINDINGS: Lower chest: There is moderate cardiomegaly. Small bilateral pleural effusions are seen, right greater than left. There  is rounded airspace opacity at the left lung base. Hepatobiliary: The liver is normal in density without focal abnormality.The main portal vein is patent. No evidence of calcified gallstones, gallbladder wall thickening or biliary dilatation. Pancreas: Unremarkable. No pancreatic ductal dilatation or surrounding inflammatory changes. Spleen: Normal in size without focal abnormality. Adrenals/Urinary Tract: Both adrenal glands appear normal. Multiple low-density lesions are seen throughout both kidneys the largest measuring 2 cm in the lower pole of the left kidney. Adjacent to the inferior pole of the right kidney there is a low-density cystic mass measuring 8.7 x 7.8 cm with which appears to either be extending from the lower pole or just adjacent. Stomach/Bowel: The stomach and small bowel are normal in appearance. There is a 10 cm segment of sigmoid colon with diffuse wall thickening surrounding fat stranding changes. No definite free air or loculated fluid collections are noted. There is a moderate amount of colonic stool present. Presacral edema is noted. Vascular/Lymphatic: There are no enlarged mesenteric, retroperitoneal, or pelvic lymph nodes. Scattered aortic atherosclerotic calcifications are seen without aneurysmal dilatation. Reproductive: The uterus and adnexa are unremarkable. Other: Within the right inguinal  region there is a 3.6 cm low-density cystic mass. Musculoskeletal: No acute or significant osseous findings. IMPRESSION: Findings consistent with acute sigmoid colonic diverticulitis. No definite loculated fluid collections or free air. Large cystic mass seen adjacent to the inferior pole the right kidney measuring 8.7 x 7.8 cm which appears to possibly be extending from the lower pole of the right kidney or just adjacent to. Likely represents a simple renal cyst or possible mesenteric cyst. Aortic Atherosclerosis (ICD10-I70.0). Electronically Signed   By: Prudencio Pair M.D.   On: 04/13/2020 23:17   DG Chest Port 1 View  Result Date: 04/13/2020 CLINICAL DATA:  Shortness of breath EXAM: PORTABLE CHEST 1 VIEW COMPARISON:  04/05/2020 FINDINGS: Cardiac shadow is enlarged but stable. Aortic calcifications are again seen. Chronic calcified pleural plaques are noted on the left with some scarring in the left base. Old rib fractures are noted on the left. No new focal infiltrate is seen. No acute bony abnormality is noted. IMPRESSION: Chronic changes similar to that seen on prior exam. No acute abnormality noted. Electronically Signed   By: Inez Catalina M.D.   On: 04/13/2020 23:00    Procedures Procedures (including critical care time)  Medications Ordered in ED Medications  sodium chloride flush (NS) 0.9 % injection 3 mL (3 mLs Intravenous Not Given 04/13/20 2158)  ciprofloxacin (CIPRO) IVPB 400 mg (has no administration in time range)  metroNIDAZOLE (FLAGYL) IVPB 500 mg (has no administration in time range)  sodium chloride 0.9 % bolus 500 mL (0 mLs Intravenous Stopped 04/13/20 2314)  iohexol (OMNIPAQUE) 300 MG/ML solution 100 mL (100 mLs Intravenous Contrast Given 04/13/20 2253)  potassium chloride (KLOR-CON) packet 40 mEq (40 mEq Oral Given 04/13/20 2314)    ED Course  I have reviewed the triage vital signs and the nursing notes.  Pertinent labs & imaging results that were available during my care of the  patient were reviewed by me and considered in my medical decision making (see chart for details).    MDM Rules/Calculators/A&P                      Patient with diverticulitis she will be admitted to medicine       This patient presents to the ED for concern of diarrhea and abdominal pain, this involves an extensive number of treatment options,  and is a complaint that carries with it a high risk of complications and morbidity.  The differential diagnosis includes colitis diverticulitis  Lab Tests:   I Ordered, reviewed, and interpreted labs, which included CBC and chemistries.  CBC shows elevated white count  Medicines ordered:   I ordered medication Cipro and Flagyl for diverticulitis  Imaging Studies ordered:   I ordered imaging studies which included CT of the abdomen and chest x-ray and  I independently visualized and interpreted imaging which showed CT shows diverticulitis chest x-ray unremarkable  Additional history obtained:   Additional history obtained from daughter  Previous records obtained and reviewed   Consultations Obtained:   I consulted hospitalist and discussed lab and imaging findings  Reevaluation:  After the interventions stated above, I reevaluated the patient and found no change  Critical Interventions:  .  '  Final Clinical Impression(s) / ED Diagnoses Final diagnoses:  Diverticulitis    Rx / DC Orders ED Discharge Orders    None       Milton Ferguson, MD 04/13/20 2336

## 2020-04-13 NOTE — ED Triage Notes (Signed)
Pt c/o of weakness and diarrhea from an ABX that she was given for an URI.

## 2020-04-13 NOTE — ED Notes (Signed)
Pt has not been having diarrhea only smears. Unable to get c diff sample

## 2020-04-14 DIAGNOSIS — R109 Unspecified abdominal pain: Secondary | ICD-10-CM | POA: Diagnosis present

## 2020-04-14 DIAGNOSIS — E876 Hypokalemia: Secondary | ICD-10-CM | POA: Diagnosis present

## 2020-04-14 DIAGNOSIS — K5792 Diverticulitis of intestine, part unspecified, without perforation or abscess without bleeding: Secondary | ICD-10-CM

## 2020-04-14 LAB — BASIC METABOLIC PANEL
Anion gap: 10 (ref 5–15)
BUN: 16 mg/dL (ref 8–23)
CO2: 27 mmol/L (ref 22–32)
Calcium: 8.4 mg/dL — ABNORMAL LOW (ref 8.9–10.3)
Chloride: 98 mmol/L (ref 98–111)
Creatinine, Ser: 0.58 mg/dL (ref 0.44–1.00)
GFR calc Af Amer: >60 mL/min (ref >60)
GFR calc non Af Amer: >60 mL/min (ref >60)
Glucose, Bld: 123 mg/dL — ABNORMAL HIGH (ref 70–99)
Potassium: 4.1 mmol/L (ref 3.5–5.1)
Sodium: 135 mmol/L (ref 135–145)

## 2020-04-14 LAB — CBC
HCT: 39.9 % (ref 36.0–46.0)
Hemoglobin: 12.8 g/dL (ref 12.0–15.0)
MCH: 28.8 pg (ref 26.0–34.0)
MCHC: 32.1 g/dL (ref 30.0–36.0)
MCV: 89.9 fL (ref 80.0–100.0)
Platelets: 120 10*3/uL — ABNORMAL LOW (ref 150–400)
RBC: 4.44 MIL/uL (ref 3.87–5.11)
RDW: 14.6 % (ref 11.5–15.5)
WBC: 12.8 10*3/uL — ABNORMAL HIGH (ref 4.0–10.5)
nRBC: 0 % (ref 0.0–0.2)

## 2020-04-14 LAB — C DIFFICILE QUICK SCREEN W PCR REFLEX
C Diff antigen: POSITIVE — AB
C Diff interpretation: DETECTED
C Diff toxin: POSITIVE — AB

## 2020-04-14 LAB — SARS CORONAVIRUS 2 BY RT PCR (HOSPITAL ORDER, PERFORMED IN ~~LOC~~ HOSPITAL LAB): SARS Coronavirus 2: NEGATIVE

## 2020-04-14 MED ORDER — ACETAMINOPHEN 650 MG RE SUPP: 650.0000 mg | Freq: Four times a day (QID) | RECTAL | Status: DC | PRN

## 2020-04-14 MED ORDER — ACETAMINOPHEN 325 MG PO TABS: 650.0000 mg | ORAL_TABLET | Freq: Four times a day (QID) | ORAL | Status: DC | PRN

## 2020-04-14 MED ORDER — VANCOMYCIN 50 MG/ML ORAL SOLUTION
125.0000 mg | Freq: Four times a day (QID) | ORAL | Status: DC
  Administered 2020-04-14 – 2020-04-15 (×3): 125 mg via ORAL
  Filled 2020-04-14 (×14): qty 2.5

## 2020-04-14 MED ORDER — CIPROFLOXACIN IN D5W 400 MG/200ML IV SOLN
400.0000 mg | Freq: Two times a day (BID) | INTRAVENOUS | Status: DC
  Administered 2020-04-14: 400 mg via INTRAVENOUS
  Filled 2020-04-14: qty 200

## 2020-04-14 MED ORDER — ONDANSETRON HCL 4 MG/2ML IJ SOLN
4.0000 mg | Freq: Four times a day (QID) | INTRAMUSCULAR | Status: DC | PRN
  Administered 2020-04-15: 4 mg via INTRAVENOUS

## 2020-04-14 MED ORDER — ASPIRIN 81 MG PO CHEW: 81.0000 mg | CHEWABLE_TABLET | Freq: Every evening | ORAL | Status: DC

## 2020-04-14 MED ORDER — ONDANSETRON HCL 4 MG PO TABS: 4.0000 mg | ORAL_TABLET | Freq: Four times a day (QID) | ORAL | Status: DC | PRN

## 2020-04-14 MED ORDER — SODIUM CHLORIDE 0.9 % IV SOLN: 1.0000 g | INTRAVENOUS | Status: DC

## 2020-04-14 MED ORDER — METOPROLOL SUCCINATE ER 25 MG PO TB24
25.0000 mg | ORAL_TABLET | Freq: Every day | ORAL | Status: DC
  Administered 2020-04-14 – 2020-04-15 (×2): 25 mg via ORAL
  Filled 2020-04-14 (×2): qty 1

## 2020-04-14 MED ORDER — RIVAROXABAN 15 MG PO TABS
15.0000 mg | ORAL_TABLET | Freq: Every day | ORAL | Status: DC
  Administered 2020-04-14 – 2020-04-15 (×2): 15 mg via ORAL
  Filled 2020-04-14 (×5): qty 1

## 2020-04-14 MED ORDER — SODIUM CHLORIDE 0.9 % IV SOLN
2.0000 g | INTRAVENOUS | Status: DC
  Administered 2020-04-14: 2 g via INTRAVENOUS
  Filled 2020-04-14: qty 20

## 2020-04-14 MED ORDER — METRONIDAZOLE IN NACL 5-0.79 MG/ML-% IV SOLN
500.0000 mg | Freq: Three times a day (TID) | INTRAVENOUS | Status: DC
  Administered 2020-04-14 (×2): 500 mg via INTRAVENOUS
  Filled 2020-04-14 (×2): qty 100

## 2020-04-14 MED ORDER — AMLODIPINE BESYLATE 5 MG PO TABS
5.0000 mg | ORAL_TABLET | Freq: Every day | ORAL | Status: DC
  Administered 2020-04-14 – 2020-04-15 (×2): 5 mg via ORAL
  Filled 2020-04-14 (×2): qty 1

## 2020-04-14 MED ORDER — MORPHINE SULFATE (PF) 2 MG/ML IV SOLN: 2.0000 mg | INTRAVENOUS | Status: DC | PRN

## 2020-04-14 NOTE — ED Notes (Signed)
Attempted report x1. 

## 2020-04-14 NOTE — ED Notes (Signed)
Pt to  Remain dnr

## 2020-04-14 NOTE — ED Notes (Signed)
ED TO INPATIENT HANDOFF REPORT  ED Nurse Name and Phone #:    S Name/Age/Gender Kathryn Marquez 86 y.o. female Room/Bed: APA17/APA17  Code Status   Code Status: DNR  Home/SNF/Other Home Patient oriented to: self, place, time and situation Is this baseline? Yes   Triage Complete: Triage complete  Chief Complaint Acute diverticulitis [K57.92]  Triage Note Pt c/o of weakness and diarrhea from an ABX that she was given for an URI.     Allergies No Known Allergies  Level of Care/Admitting Diagnosis ED Disposition    ED Disposition Condition [facility]: [facility] [[id number]]  Level of Care: Telemetry [5]  Covid Evaluation: Asymptomatic Screening Protocol (No Symptoms)  Diagnosis: Acute diverticulitis [[id number]]  Admitting Physician: Edmonia Lynch [[id number]]  Attending Physician: Edmonia Lynch [[id number]]  Estimated length of stay: 3 - 4 days  Certification:: I certify this patient will need inpatient services for at least 2 midnights       B Medical/Surgery History Past Medical History:  Diagnosis Date  . Acute embolic stroke ([place]) 0/94/7096  . Dyslipidemia, goal LDL below 100 04/30/2012  . Dysrhythmia    Chronic atrial fib  . Hypertension    History reviewed. No pertinent surgical history.   A IV Location/Drains/Wounds Patient Lines/Drains/Airways Status   Active Line/Drains/Airways    Name:   Placement date:   Placement time:   Site:   Days:   Peripheral IV 04/13/20 Right Forearm   04/13/20    2142    Forearm   1          Intake/Output Last 24 hours  Intake/Output Summary (Last 24 hours) at 04/14/2020 1543 Last data filed at 04/14/2020 1032 Gross per 24 hour  Intake 798.74 ml  Output --  Net 798.74 ml    Labs/Imaging Results for orders placed or performed during the hospital encounter of 04/13/20 (from the past 48 hour(s))  Lipase, blood     Status: None   Collection Time: 04/13/20  6:40 PM  Result Value Ref Range    Lipase 19 11 - 51 U/L    Comment: Performed at [facility], [address], [place], [place] [address]  Comprehensive metabolic panel     Status: Abnormal   Collection Time: 04/13/20  6:40 PM  Result Value Ref Range   Sodium 133 (L) 135 - 145 mmol/L   Potassium 3.2 (L) 3.5 - 5.1 mmol/L   Chloride 94 (L) 98 - 111 mmol/L   CO2 28 22 - 32 mmol/L   Glucose, Bld 118 (H) 70 - 99 mg/dL    Comment: Glucose reference range applies only to samples taken after fasting for at least 8 hours.   BUN 16 8 - 23 mg/dL   Creatinine, Ser 0.62 0.44 - 1.00 mg/dL   Calcium 8.9 8.9 - 10.3 mg/dL   Total Protein 7.0 6.5 - 8.1 g/dL   Albumin 3.9 3.5 - 5.0 g/dL   AST 19 15 - 41 U/L   ALT 15 0 - 44 U/L   Alkaline Phosphatase 61 38 - 126 U/L   Total Bilirubin 1.9 (H) 0.3 - 1.2 mg/dL   GFR calc non Af Amer >60 >60 mL/min   GFR calc Af Amer >60 >60 mL/min   Anion gap 11 5 - 15    Comment: Performed at [facility], [address], [place], [place] [address]  CBC     Status: Abnormal   Collection Time: 04/13/20  6:40 PM  Result Value Ref Range   WBC 16.4 (H) 4.0 - 10.5 K/uL   RBC 4.88 3.87 - 5.11 MIL/uL   Hemoglobin 14.1 12.0 - 15.0 g/dL   HCT 43.4 36.0 - 46.0 %   MCV 88.9 80.0 - 100.0 fL   MCH 28.9 26.0 - 34.0 pg   MCHC 32.5 30.0 - 36.0 g/dL   RDW 14.6 11.5 - 15.5 %   Platelets 147 (L) 150 - 400 K/uL   nRBC 0.0 0.0 - 0.2 %    Comment: Performed at [facility], [address] [place], [place] [address]  Urinalysis, Routine w reflex microscopic     Status: Abnormal   Collection Time: 04/13/20  9:32 PM  Result Value Ref Range   Color, Urine AMBER (A) YELLOW    Comment: BIOCHEMICALS MAY BE AFFECTED BY COLOR   APPearance CLEAR CLEAR   Specific Gravity, Urine 1.023 1.005 - 1.030   pH 5.0 5.0 - 8.0   Glucose, UA NEGATIVE NEGATIVE mg/dL   Hgb urine dipstick MODERATE (A) NEGATIVE   Bilirubin Urine NEGATIVE NEGATIVE   Ketones, ur 5 (A) NEGATIVE mg/dL   Protein, ur 30 (A) NEGATIVE mg/dL   Nitrite NEGATIVE  NEGATIVE   Leukocytes,Ua NEGATIVE NEGATIVE   RBC / HPF 0-5 0 - 5 RBC/hpf   WBC, UA 0-5 0 - 5 WBC/hpf   Bacteria, UA NONE SEEN NONE SEEN   Mucus PRESENT     Comment: Performed at [facility], [address] [place], [place] [address]  SARS Coronavirus 2 by RT PCR (hospital order, performed in [place] hospital lab) Nasopharyngeal     Status: None   Collection Time: 04/13/20 11:59 PM   Specimen: Nasopharyngeal  Result Value Ref Range   SARS Coronavirus 2 NEGATIVE NEGATIVE    Comment: (NOTE) SARS-CoV-2 target nucleic acids are NOT DETECTED. The SARS-CoV-2 RNA is generally detectable in upper and lower respiratory specimens during the acute phase of infection. The lowest concentration of SARS-CoV-2 viral copies this assay can detect is 250 copies / mL. A negative result does not preclude SARS-CoV-2 infection and should not be used as the sole basis for treatment or other patient management decisions.  A negative result may occur with improper specimen collection / handling, submission of specimen other than nasopharyngeal swab, presence of viral mutation(s) within the areas targeted by this assay, and inadequate number of viral copies (<250 copies / mL). A negative result must be combined with clinical observations, patient history, and epidemiological information. Fact Sheet for Patients:   StrictlyIdeas.no Fact Sheet for Healthcare Providers: BankingDealers.co.za This test is not yet approved or cleared  by the [place] FDA and has been authorized for detection and/or diagnosis of SARS-CoV-2 by FDA under an Emergency Use Authorization (EUA).  This EUA will remain in effect (meaning this test can be used) for the duration of the COVID-19 declaration under Section 564(b)(1) of the Act, 21 U.S.C. section 360bbb-3(b)(1), unless the authorization is terminated or revoked sooner. Performed at [facility], [address]  [place],  [address]   CBC     Status: Abnormal   Collection Time: 04/14/20  3:27 AM  Result Value Ref Range   WBC 12.8 (H) 4.0 - 10.5 K/uL   RBC 4.44 3.87 - 5.11 MIL/uL   Hemoglobin 12.8 12.0 - 15.0 g/dL   HCT 39.9 36.0 - 46.0 %   MCV 89.9 80.0 - 100.0 fL   MCH 28.8 26.0 - 34.0 pg   MCHC  32.1 30.0 - 36.0 g/dL   RDW 14.6 11.5 - 15.5 %   Platelets 120 (L) 150 - 400 K/uL   nRBC 0.0 0.0 - 0.2 %    Comment: Performed at [facility], [address], [place], [place] [address]  Basic metabolic panel     Status: Abnormal   Collection Time: 04/14/20  3:27 AM  Result Value Ref Range   Sodium 135 135 - 145 mmol/L   Potassium 4.1 3.5 - 5.1 mmol/L    Comment: DELTA CHECK NOTED   Chloride 98 98 - 111 mmol/L   CO2 27 22 - 32 mmol/L   Glucose, Bld 123 (H) 70 - 99 mg/dL    Comment: Glucose reference range applies only to samples taken after fasting for at least 8 hours.   BUN 16 8 - 23 mg/dL   Creatinine, Ser 0.58 0.44 - 1.00 mg/dL   Calcium 8.4 (L) 8.9 - 10.3 mg/dL   GFR calc non Af Amer >60 >60 mL/min   GFR calc Af Amer >60 >60 mL/min   Anion gap 10 5 - 15    Comment: Performed at [facility], [address], [place],  [address]   CT ABDOMEN PELVIS W CONTRAST  Result Date: 04/13/2020 CLINICAL DATA:  Acute abdominal pain EXAM: CT ABDOMEN AND PELVIS WITH CONTRAST TECHNIQUE: Multidetector CT imaging of the abdomen and pelvis was performed using the standard protocol following bolus administration of intravenous contrast. CONTRAST:  164mL OMNIPAQUE IOHEXOL 300 MG/ML  SOLN COMPARISON:  None. FINDINGS: Lower chest: There is moderate cardiomegaly. Small bilateral pleural effusions are seen, right greater than left. There is rounded airspace opacity at the left lung base. Hepatobiliary: The liver is normal in density without focal abnormality.The main portal vein is patent. No evidence of calcified gallstones, gallbladder wall thickening or biliary dilatation. Pancreas: Unremarkable. No  pancreatic ductal dilatation or surrounding inflammatory changes. Spleen: Normal in size without focal abnormality. Adrenals/Urinary Tract: Both adrenal glands appear normal. Multiple low-density lesions are seen throughout both kidneys the largest measuring 2 cm in the lower pole of the left kidney. Adjacent to the inferior pole of the right kidney there is a low-density cystic mass measuring 8.7 x 7.8 cm with which appears to either be extending from the lower pole or just adjacent. Stomach/Bowel: The stomach and small bowel are normal in appearance. There is a 10 cm segment of sigmoid colon with diffuse wall thickening surrounding fat stranding changes. No definite free air or loculated fluid collections are noted. There is a moderate amount of colonic stool present. Presacral edema is noted. Vascular/Lymphatic: There are no enlarged mesenteric, retroperitoneal, or pelvic lymph nodes. Scattered aortic atherosclerotic calcifications are seen without aneurysmal dilatation. Reproductive: The uterus and adnexa are unremarkable. Other: Within the right inguinal region there is a 3.6 cm low-density cystic mass. Musculoskeletal: No acute or significant osseous findings. IMPRESSION: Findings consistent with acute sigmoid colonic diverticulitis. No definite loculated fluid collections or free air. Large cystic mass seen adjacent to the inferior pole the right kidney measuring 8.7 x 7.8 cm which appears to possibly be extending from the lower pole of the right kidney or just adjacent to. Likely represents a simple renal cyst or possible mesenteric cyst. Aortic Atherosclerosis (ICD10-I70.0). Electronically Signed   By: Prudencio Pair M.D.   On: 04/13/2020 23:17   DG Chest Port 1 View  Result Date: 04/13/2020 CLINICAL DATA:  Shortness of breath EXAM: PORTABLE CHEST 1 VIEW COMPARISON:  04/05/2020 FINDINGS: Cardiac shadow is enlarged  but stable. Aortic calcifications are again seen. Chronic calcified pleural plaques are noted  on the left with some scarring in the left base. Old rib fractures are noted on the left. No new focal infiltrate is seen. No acute bony abnormality is noted. IMPRESSION: Chronic changes similar to that seen on prior exam. No acute abnormality noted. Electronically Signed   By: Inez Catalina M.D.   On: 04/13/2020 23:00    Pending Labs Unresulted Labs (From admission, onward)    Start     Ordered   04/13/20 2222  C Difficile Quick Screen w PCR reflex  (C Difficile quick screen w PCR reflex panel)  Once, for 24 hours,   STAT     04/13/20 2221          Vitals/Pain Today's Vitals   04/14/20 1400 04/14/20 1430 04/14/20 1500 04/14/20 1530  BP: (!) 116/56 104/62 (!) 110/51 112/70  Pulse:  65 89   Resp: (!) 28 18 (!) 29 (!) 23  Temp:      TempSrc:      SpO2:  100% 95%   Weight:      Height:      PainSc:        Isolation Precautions Enteric precautions (UV disinfection)  Medications Medications  sodium chloride flush (NS) 0.9 % injection 3 mL (3 mLs Intravenous Not Given 04/13/20 2158)  acetaminophen (TYLENOL) tablet 650 mg (has no administration in time range)    Or  acetaminophen (TYLENOL) suppository 650 mg (has no administration in time range)  morphine 2 MG/ML injection 2 mg (has no administration in time range)  ondansetron (ZOFRAN) tablet 4 mg (has no administration in time range)    Or  ondansetron (ZOFRAN) injection 4 mg (has no administration in time range)  ciprofloxacin (CIPRO) IVPB 400 mg ( Intravenous Stopped 04/14/20 1028)  metroNIDAZOLE (FLAGYL) IVPB 500 mg (500 mg Intravenous New Bag/Given 04/14/20 1512)  amLODipine (NORVASC) tablet 5 mg (5 mg Oral Given 04/14/20 0922)  metoprolol succinate (TOPROL-XL) 24 hr tablet 25 mg (25 mg Oral Given 04/14/20 0922)  Rivaroxaban (XARELTO) tablet 15 mg (15 mg Oral Given 04/14/20 1042)  aspirin chewable tablet 81 mg (has no administration in time range)  sodium chloride 0.9 % bolus 500 mL (0 mLs Intravenous Stopped 04/13/20 2314)  iohexol  (OMNIPAQUE) 300 MG/ML solution 100 mL (100 mLs Intravenous Contrast Given 04/13/20 2253)  potassium chloride (KLOR-CON) packet 40 mEq (40 mEq Oral Given 04/13/20 2314)  ciprofloxacin (CIPRO) IVPB 400 mg (0 mg Intravenous Stopped 04/14/20 0201)  metroNIDAZOLE (FLAGYL) IVPB 500 mg (0 mg Intravenous Stopped 04/14/20 0201)    Mobility non-ambulatory High fall risk   Focused Assessments    R Recommendations: See Admitting Provider Note  Report given to:   Additional Notes:

## 2020-04-14 NOTE — ED Notes (Signed)
Pt was given breakfast tray 

## 2020-04-14 NOTE — Progress Notes (Addendum)
PROGRESS NOTE    Kathryn Marquez  PIR:[id number] DOB: 1921/02/11 DOA: 04/13/2020 PCP: Redmond School, MD      Brief Narrative:  Kathryn Marquez is a 86 y.o. F with hx HTN, Afib on Xarelto, home-dwelling who presented with abdominal pain, diarrhea.  Patient had been in her usual state of health until several days ago, started developed congestion URI symptoms.  Went to her PCP who prescribed Augmentin.  After starting Augmentin, the patient started to have diarrhea, nausea, abdominal discomfort, and anorexia so finally came to the ER.  In the ER she had WBC 16.4K, heart rate 90, respirations 23.  Electrolytes and renal function normal.  UA negative, chest x-ray clear.  CT of the abdomen and pelvis was obtained and showed diverticulitis.  She was started on Cipro and Flagyl.        Assessment & Plan:  Acute sigmoid diverticulitis --> addendum: ruled out Patient developed abdominal pain, nausea, and diarrhea with leukocytosis while taking appropriate oral therapy for diverticulitis, and a sense of failed outpatient therapy. -Continue antibiotics, changed to ceftriaxone with Flagyl -Soft diet -Continue analgesia and antiemetics   ADDENDUM: Cdiff returned antigen and toxin positive.  In light of her story (URI followed by Augmentin followed by diarrhea, decreased appetite, abdominal cramps/pain and leukocytosis) I suspect this is NOT diverticulitis but is cdiff.  Cdiff colitis  -Stop ceftriaxone -Start oral vancomycin -Enteric precautions    Hypertension Blood pressure controlled -Continue amlodipine  Atrial fibrillation, presumed chronic -Continue Xarelto, metoprolol  Hypokalemia Resolved  Thrombocytopenia Mild      Disposition: Status is: Inpatient  Remains inpatient appropriate because:Failed outpatient therapy, also in the setting of severely advanced age, frailty   Dispo: The patient is from: Home              Anticipated d/c is to: Home               Anticipated d/c date is: 1 day              Patient currently is not medically stable to d/c.              MDM: This is a no charge note.  For further details, please see H&P by my partner Dr. Humphrey Rolls from earlier today.  The below labs and imaging reports were reviewed and summarized above.    DVT prophylaxis: Not applicable, on Xarelto Code Status: Full code Family Communication: Son by phone    Consultants:     Procedures:     Antimicrobials:   Ciprofloxacin x1  Flagyl 6/3>>  Ceftriaxone 6/4>>  Culture data:              Subjective: Patient still has some left upper quadrant discomfort.  No diarrhea today.  No vomiting.  No confusion.  She has had no bowel movements in the last 24 hours.  No fever.        Objective: Vitals:   04/14/20 1400 04/14/20 1430 04/14/20 1500 04/14/20 1530  BP: (!) 116/56 104/62 (!) 110/51 112/70  Pulse:  65 89   Resp: (!) 28 18 (!) 29 (!) 23  Temp:      TempSrc:      SpO2:  100% 95%   Weight:      Height:        Intake/Output Summary (Last 24 hours) at 04/14/2020 1551 Last data filed at 04/14/2020 1032 Gross per 24 hour  Intake 798.74 ml  Output --  Net 798.74 ml  Filed Weights   04/13/20 1812  Weight: 63.5 kg    Examination: The patient was seen and examined.      Data Reviewed: I have personally reviewed following labs and imaging studies:  CBC: Recent Labs  Lab 04/13/20 1840 04/14/20 0327  WBC 16.4* 12.8*  HGB 14.1 12.8  HCT 43.4 39.9  MCV 88.9 89.9  PLT 147* 631*   Basic Metabolic Panel: Recent Labs  Lab 04/13/20 1840 04/14/20 0327  NA 133* 135  K 3.2* 4.1  CL 94* 98  CO2 28 27  GLUCOSE 118* 123*  BUN 16 16  CREATININE 0.62 0.58  CALCIUM 8.9 8.4*   GFR: Estimated Creatinine Clearance: 36.9 mL/min (by C-G formula based on SCr of 0.58 mg/dL). Liver Function Tests: Recent Labs  Lab 04/13/20 1840  AST 19  ALT 15  ALKPHOS 61  BILITOT 1.9*  PROT 7.0  ALBUMIN 3.9    Recent Labs  Lab 04/13/20 1840  LIPASE 19   No results for input(s): AMMONIA in the last 168 hours. Coagulation Profile: No results for input(s): INR, PROTIME in the last 168 hours. Cardiac Enzymes: No results for input(s): CKTOTAL, CKMB, CKMBINDEX, TROPONINI in the last 168 hours. BNP (last 3 results) No results for input(s): PROBNP in the last 8760 hours. HbA1C: No results for input(s): HGBA1C in the last 72 hours. CBG: No results for input(s): GLUCAP in the last 168 hours. Lipid Profile: No results for input(s): CHOL, HDL, LDLCALC, TRIG, CHOLHDL, LDLDIRECT in the last 72 hours. Thyroid Function Tests: No results for input(s): TSH, T4TOTAL, FREET4, T3FREE, THYROIDAB in the last 72 hours. Anemia Panel: No results for input(s): VITAMINB12, FOLATE, FERRITIN, TIBC, IRON, RETICCTPCT in the last 72 hours. Urine analysis:    Component Value Date/Time   COLORURINE AMBER (A) 04/13/2020 2132   APPEARANCEUR CLEAR 04/13/2020 2132   LABSPEC 1.023 04/13/2020 2132   PHURINE 5.0 04/13/2020 2132   GLUCOSEU NEGATIVE 04/13/2020 2132   HGBUR MODERATE (A) 04/13/2020 2132   BILIRUBINUR NEGATIVE 04/13/2020 2132   KETONESUR 5 (A) 04/13/2020 2132   PROTEINUR 30 (A) 04/13/2020 2132   UROBILINOGEN 0.2 04/28/2012 2308   NITRITE NEGATIVE 04/13/2020 2132   LEUKOCYTESUR NEGATIVE 04/13/2020 2132   Sepsis Labs: @LABRCNTIP (procalcitonin:4,lacticacidven:4)  ) Recent Results (from the past 240 hour(s))  SARS Coronavirus 2 by RT PCR (hospital order, performed in [place] hospital lab) Nasopharyngeal     Status: None   Collection Time: 04/13/20 11:59 PM   Specimen: Nasopharyngeal  Result Value Ref Range Status   SARS Coronavirus 2 NEGATIVE NEGATIVE Final    Comment: (NOTE) SARS-CoV-2 target nucleic acids are NOT DETECTED. The SARS-CoV-2 RNA is generally detectable in upper and lower respiratory specimens during the acute phase of infection. The lowest concentration of SARS-CoV-2 viral copies  this assay can detect is 250 copies / mL. A negative result does not preclude SARS-CoV-2 infection and should not be used as the sole basis for treatment or other patient management decisions.  A negative result may occur with improper specimen collection / handling, submission of specimen other than nasopharyngeal swab, presence of viral mutation(s) within the areas targeted by this assay, and inadequate number of viral copies (<250 copies / mL). A negative result must be combined with clinical observations, patient history, and epidemiological information. Fact Sheet for Patients:   StrictlyIdeas.no Fact Sheet for Healthcare Providers: BankingDealers.co.za This test is not yet approved or cleared  by the [place] FDA and has been authorized for detection and/or diagnosis of  SARS-CoV-2 by FDA under an Emergency Use Authorization (EUA).  This EUA will remain in effect (meaning this test can be used) for the duration of the COVID-19 declaration under Section 564(b)(1) of the Act, 21 U.S.C. section 360bbb-3(b)(1), unless the authorization is terminated or revoked sooner. Performed at [facility], [address], [place], [place] [address]          Radiology Studies: CT ABDOMEN PELVIS W CONTRAST  Result Date: 04/13/2020 CLINICAL DATA:  Acute abdominal pain EXAM: CT ABDOMEN AND PELVIS WITH CONTRAST TECHNIQUE: Multidetector CT imaging of the abdomen and pelvis was performed using the standard protocol following bolus administration of intravenous contrast. CONTRAST:  1104mL OMNIPAQUE IOHEXOL 300 MG/ML  SOLN COMPARISON:  None. FINDINGS: Lower chest: There is moderate cardiomegaly. Small bilateral pleural effusions are seen, right greater than left. There is rounded airspace opacity at the left lung base. Hepatobiliary: The liver is normal in density without focal abnormality.The main portal vein is patent. No evidence of calcified gallstones,  gallbladder wall thickening or biliary dilatation. Pancreas: Unremarkable. No pancreatic ductal dilatation or surrounding inflammatory changes. Spleen: Normal in size without focal abnormality. Adrenals/Urinary Tract: Both adrenal glands appear normal. Multiple low-density lesions are seen throughout both kidneys the largest measuring 2 cm in the lower pole of the left kidney. Adjacent to the inferior pole of the right kidney there is a low-density cystic mass measuring 8.7 x 7.8 cm with which appears to either be extending from the lower pole or just adjacent. Stomach/Bowel: The stomach and small bowel are normal in appearance. There is a 10 cm segment of sigmoid colon with diffuse wall thickening surrounding fat stranding changes. No definite free air or loculated fluid collections are noted. There is a moderate amount of colonic stool present. Presacral edema is noted. Vascular/Lymphatic: There are no enlarged mesenteric, retroperitoneal, or pelvic lymph nodes. Scattered aortic atherosclerotic calcifications are seen without aneurysmal dilatation. Reproductive: The uterus and adnexa are unremarkable. Other: Within the right inguinal region there is a 3.6 cm low-density cystic mass. Musculoskeletal: No acute or significant osseous findings. IMPRESSION: Findings consistent with acute sigmoid colonic diverticulitis. No definite loculated fluid collections or free air. Large cystic mass seen adjacent to the inferior pole the right kidney measuring 8.7 x 7.8 cm which appears to possibly be extending from the lower pole of the right kidney or just adjacent to. Likely represents a simple renal cyst or possible mesenteric cyst. Aortic Atherosclerosis (ICD10-I70.0). Electronically Signed   By: Prudencio Pair M.D.   On: 04/13/2020 23:17   DG Chest Port 1 View  Result Date: 04/13/2020 CLINICAL DATA:  Shortness of breath EXAM: PORTABLE CHEST 1 VIEW COMPARISON:  04/05/2020 FINDINGS: Cardiac shadow is enlarged but stable.  Aortic calcifications are again seen. Chronic calcified pleural plaques are noted on the left with some scarring in the left base. Old rib fractures are noted on the left. No new focal infiltrate is seen. No acute bony abnormality is noted. IMPRESSION: Chronic changes similar to that seen on prior exam. No acute abnormality noted. Electronically Signed   By: Inez Catalina M.D.   On: 04/13/2020 23:00        Scheduled Meds: . amLODipine  5 mg Oral Daily  . aspirin  81 mg Oral QPM  . metoprolol succinate  25 mg Oral Daily  . Rivaroxaban  15 mg Oral Q breakfast  . sodium chloride flush  3 mL Intravenous Once   Continuous Infusions: . cefTRIAXone (ROCEPHIN)  IV    .  metronidazole 500 mg (04/14/20 1512)     LOS: 1 day    Time spent: 25 minutes    Edwin Dada, MD [facility] 04/14/2020, 3:51 PM     Please page though [place] or Epic secure chat:  For password, contact charge nurse

## 2020-04-14 NOTE — H&P (Signed)
History and Physical    Kathryn Marquez YCX:[id number] DOB: 1921-08-04 DOA: 04/13/2020  PCP: Redmond School, MD (Confirm with patient/family/NH records and if not entered, this has to be entered at [facility] point of entry) Patient coming from: Home   I have personally briefly reviewed patient's old medical records in [place]  Chief Complaint: Abdominal pain  HPI: Kathryn Marquez is a 86 y.o. female with medical history significant of hypertension, hyperlipidemia and chronic atrial fibrillation on Xarelto presented to ED complaining of  abdominal pain.  Patient states that pain started suddenly without any aggravating factor, sharp in nature located in lower abdomen but more prominent in left lower quadrant, continuous relieved and for some by nothing and associated with diarrhea.  Patient denies nausea, vomiting, constipation, hematochezia and melena.  Patient states that she never had this type of pain in the past denies any recent dietary changes.  Patient also denies fever, chills, chest pain, shortness of breath and urinary symptoms.  ED Course: On arrival to the ED patient had blood pressure of 138/72, heart rate 90, respiratory rate 23, temperature 98.2 and oxygen saturation 97% on room air.  Blood work showed WBC 16.4, hemoglobin 14.1, sodium 133, potassium 3.2, BUN 16, creatinine 0.6 and blood glucose 118.  UA negative for UTI.  X-ray negative for acute cardiopulmonary changes.  CT abdomen/pelvis is positive for head colonic diverticulitis and right-sided renal cyst.  Patient was given a bolus of normal saline and started on IV ciprofloxacin and IV metronidazole.  Review of Systems: As per HPI otherwise 10 point review of systems negative.  Unacceptable ROS statements: "10 systems reviewed," "Extensive" (without elaboration).  Acceptable ROS statements: "All others negative," "All others reviewed and are negative," and "All others unremarkable," with at [place] documented Can't  double dip - if using for HPI can't use for ROS  Past Medical History:  Diagnosis Date  . Acute embolic stroke ([place]) 4/97/0263  . Dyslipidemia, goal LDL below 100 04/30/2012  . Dysrhythmia    Chronic atrial fib  . Hypertension     History reviewed. No pertinent surgical history.   reports that she has never smoked. She has never used smokeless tobacco. She reports that she does not drink alcohol or use drugs.  No Known Allergies  Family History  Problem Relation Age of Onset  . Stroke Other   . Cancer Other   . Heart attack Mother   . Leukemia Sister   . Cancer Brother     Unacceptable: Noncontributory, unremarkable, or negative. Acceptable: (example)Family history negative for heart disease  Prior to Admission medications   Medication Sig Start Date End Date Taking? Authorizing Provider  amLODipine (NORVASC) 5 MG tablet Take 5 mg by mouth every morning.   Yes [provider]  amoxicillin-clavulanate (AUGMENTIN) 875-125 MG tablet Take 1 tablet by mouth 2 (two) times daily. 10 day course starting on 04/05/2020 04/05/20  Yes [provider]  aspirin 81 MG chewable tablet Chew 2 tablets (162 mg total) by mouth every morning. Patient taking differently: Chew 81 mg by mouth every evening.  04/30/12  Yes Rexene Alberts, MD  cetirizine (ZYRTEC) 10 MG tablet Take 10 mg by mouth daily.   Yes [provider]  diphenoxylate-atropine (LOMOTIL) 2.5-0.025 MG tablet Take 2 tablets by mouth 4 (four) times daily as needed for diarrhea or loose stools.  04/12/20  Yes [provider]  hydrochlorothiazide (HYDRODIURIL) 25 MG tablet Take 25 mg by mouth daily as needed.  For fluid   Yes [provider]  metoprolol succinate (TOPROL-XL) 25 MG 24 hr tablet Take 1 tablet (25 mg total) by mouth daily. New blood pressure medicine. 04/30/12 04/13/20 Yes Rexene Alberts, MD  XARELTO 15 MG TABS tablet Take 15 mg by mouth daily. 12/14/19  Yes [provider]     Physical Exam: Vitals:   04/14/20 0330 04/14/20 0340 04/14/20 0400 04/14/20 0430  BP: 119/62  125/69 123/66  Pulse: 99 90 (!) 104   Resp: (!) 27 (!) 29 (!) 28 (!) 26  Temp:      TempSrc:      SpO2: 95% 94% 94%   Weight:      Height:        Constitutional: NAD, calm, comfortable Vitals:   04/14/20 0330 04/14/20 0340 04/14/20 0400 04/14/20 0430  BP: 119/62  125/69 123/66  Pulse: 99 90 (!) 104   Resp: (!) 27 (!) 29 (!) 28 (!) 26  Temp:      TempSrc:      SpO2: 95% 94% 94%   Weight:      Height:        General: Patient is a pleasant 86 year old Caucasian female in no acute distress Eyes: PERRL, lids and conjunctivae normal ENMT: Mucous membranes are moist. Posterior pharynx clear of any exudate or lesions.Normal dentition.  Neck: normal, supple, no masses, no thyromegaly Respiratory: clear to auscultation bilaterally, no wheezing, no crackles. Normal respiratory effort. No accessory muscle use.  Cardiovascular: Regular rate and rhythm, no murmurs / rubs / gallops. No extremity edema. 2+ pedal pulses. No carotid bruits.  Abdomen: Abdomen is soft, mildly distended and positive for tenderness in left lower quadrant.  No masses palpated. No hepatosplenomegaly. Bowel sounds positive.  Musculoskeletal: no clubbing / cyanosis. No joint deformity upper and lower extremities. Good ROM, no contractures. Normal muscle tone.  Skin: no rashes, lesions, ulcers. No induration Neurologic: CN 2-12 grossly intact. Sensation intact, DTR normal. Strength 5/5 in all 4.  Psychiatric: Normal judgment and insight. Alert and oriented x 3. Normal mood.   (Anything < 9 systems with 2 bullets each down codes to level 1) (If patient refuses exam can't bill higher level) (Make sure to document decubitus ulcers present on admission -- if possible -- and whether patient has chronic indwelling catheter at time of admission)  Labs on Admission: I have personally reviewed following labs and imaging  studies  CBC: Recent Labs  Lab 04/13/20 1840 04/14/20 0327  WBC 16.4* 12.8*  HGB 14.1 12.8  HCT 43.4 39.9  MCV 88.9 89.9  PLT 147* 161*   Basic Metabolic Panel: Recent Labs  Lab 04/13/20 1840 04/14/20 0327  NA 133* 135  K 3.2* 4.1  CL 94* 98  CO2 28 27  GLUCOSE 118* 123*  BUN 16 16  CREATININE 0.62 0.58  CALCIUM 8.9 8.4*   GFR: Estimated Creatinine Clearance: 36.9 mL/min (by C-G formula based on SCr of 0.58 mg/dL). Liver Function Tests: Recent Labs  Lab 04/13/20 1840  AST 19  ALT 15  ALKPHOS 61  BILITOT 1.9*  PROT 7.0  ALBUMIN 3.9   Recent Labs  Lab 04/13/20 1840  LIPASE 19   No results for input(s): AMMONIA in the last 168 hours. Coagulation Profile: No results for input(s): INR, PROTIME in the last 168 hours. Cardiac Enzymes: No results for input(s): CKTOTAL, CKMB, CKMBINDEX, TROPONINI in the last 168 hours. BNP (last 3 results) No results for input(s): PROBNP in the last 8760 hours. HbA1C:  No results for input(s): HGBA1C in the last 72 hours. CBG: No results for input(s): GLUCAP in the last 168 hours. Lipid Profile: No results for input(s): CHOL, HDL, LDLCALC, TRIG, CHOLHDL, LDLDIRECT in the last 72 hours. Thyroid Function Tests: No results for input(s): TSH, T4TOTAL, FREET4, T3FREE, THYROIDAB in the last 72 hours. Anemia Panel: No results for input(s): VITAMINB12, FOLATE, FERRITIN, TIBC, IRON, RETICCTPCT in the last 72 hours. Urine analysis:    Component Value Date/Time   COLORURINE AMBER (A) 04/13/2020 2132   APPEARANCEUR CLEAR 04/13/2020 2132   LABSPEC 1.023 04/13/2020 2132   PHURINE 5.0 04/13/2020 2132   GLUCOSEU NEGATIVE 04/13/2020 2132   HGBUR MODERATE (A) 04/13/2020 2132   BILIRUBINUR NEGATIVE 04/13/2020 2132   KETONESUR 5 (A) 04/13/2020 2132   PROTEINUR 30 (A) 04/13/2020 2132   UROBILINOGEN 0.2 04/28/2012 2308   NITRITE NEGATIVE 04/13/2020 2132   LEUKOCYTESUR NEGATIVE 04/13/2020 2132    Radiological Exams on Admission: CT  ABDOMEN PELVIS W CONTRAST  Result Date: 04/13/2020 CLINICAL DATA:  Acute abdominal pain EXAM: CT ABDOMEN AND PELVIS WITH CONTRAST TECHNIQUE: Multidetector CT imaging of the abdomen and pelvis was performed using the standard protocol following bolus administration of intravenous contrast. CONTRAST:  163mL OMNIPAQUE IOHEXOL 300 MG/ML  SOLN COMPARISON:  None. FINDINGS: Lower chest: There is moderate cardiomegaly. Small bilateral pleural effusions are seen, right greater than left. There is rounded airspace opacity at the left lung base. Hepatobiliary: The liver is normal in density without focal abnormality.The main portal vein is patent. No evidence of calcified gallstones, gallbladder wall thickening or biliary dilatation. Pancreas: Unremarkable. No pancreatic ductal dilatation or surrounding inflammatory changes. Spleen: Normal in size without focal abnormality. Adrenals/Urinary Tract: Both adrenal glands appear normal. Multiple low-density lesions are seen throughout both kidneys the largest measuring 2 cm in the lower pole of the left kidney. Adjacent to the inferior pole of the right kidney there is a low-density cystic mass measuring 8.7 x 7.8 cm with which appears to either be extending from the lower pole or just adjacent. Stomach/Bowel: The stomach and small bowel are normal in appearance. There is a 10 cm segment of sigmoid colon with diffuse wall thickening surrounding fat stranding changes. No definite free air or loculated fluid collections are noted. There is a moderate amount of colonic stool present. Presacral edema is noted. Vascular/Lymphatic: There are no enlarged mesenteric, retroperitoneal, or pelvic lymph nodes. Scattered aortic atherosclerotic calcifications are seen without aneurysmal dilatation. Reproductive: The uterus and adnexa are unremarkable. Other: Within the right inguinal region there is a 3.6 cm low-density cystic mass. Musculoskeletal: No acute or significant osseous findings.  IMPRESSION: Findings consistent with acute sigmoid colonic diverticulitis. No definite loculated fluid collections or free air. Large cystic mass seen adjacent to the inferior pole the right kidney measuring 8.7 x 7.8 cm which appears to possibly be extending from the lower pole of the right kidney or just adjacent to. Likely represents a simple renal cyst or possible mesenteric cyst. Aortic Atherosclerosis (ICD10-I70.0). Electronically Signed   By: Prudencio Pair M.D.   On: 04/13/2020 23:17   DG Chest Port 1 View  Result Date: 04/13/2020 CLINICAL DATA:  Shortness of breath EXAM: PORTABLE CHEST 1 VIEW COMPARISON:  04/05/2020 FINDINGS: Cardiac shadow is enlarged but stable. Aortic calcifications are again seen. Chronic calcified pleural plaques are noted on the left with some scarring in the left base. Old rib fractures are noted on the left. No new focal infiltrate is seen. No acute bony abnormality is  noted. IMPRESSION: Chronic changes similar to that seen on prior exam. No acute abnormality noted. Electronically Signed   By: Inez Catalina M.D.   On: 04/13/2020 23:00     Assessment/Plan Principal Problem:   Acute diverticulitis Patient will be continued on IV ciprofloxacin and IV metronidazole. Tylenol for mild pain and fever as needed. IV morphine 2 mg every 4 hours as needed for moderate to severe pain. IV Zofran 4 mg every 6 hours as needed for nausea and vomiting. Patient is not having active vomiting and able to tolerate oral diet and started on diet. Continue to monitor  Active Problems: Abdominal pain Secondary to above Tylenol as needed for mild pain while IV morphine as needed for moderate to severe pain.    Hypokalemia Potassium repletion done in the ED.  Continue to monitor potassium level and replete as needed.  Hypertension Stable Continue home medication  Atrial fibrillation Stable and not in RVR. Continue Toprol and Xarelto      DVT prophylaxis: Xarelto Code Status:  DNR.  Patient's CODE STATUS is confirmed from her daughter Family Communication: No family member present at the bedside Disposition Plan:  Consults called: None Admission status: Inpatient/telemetry   Edmonia Lynch MD [facility] Pager [phone]   If 7PM-7AM, please contact night-coverage [email or web address] Password [email or web address]  04/14/2020, 5:15 AM

## 2020-04-14 NOTE — Progress Notes (Signed)
CRITICAL VALUE ALERT  Critical Value:  Positive for c-diff  Date & Time Notied:  04/14/20 2353  Provider Notified: MD Danford  Orders Received/Actions taken: Orders obtained; will give new abx

## 2020-04-15 DIAGNOSIS — E876 Hypokalemia: Secondary | ICD-10-CM

## 2020-04-15 DIAGNOSIS — A0472 Enterocolitis due to Clostridium difficile, not specified as recurrent: Secondary | ICD-10-CM

## 2020-04-15 LAB — CBC
HCT: 39.7 % (ref 36.0–46.0)
Hemoglobin: 13.1 g/dL (ref 12.0–15.0)
MCH: 28.9 pg (ref 26.0–34.0)
MCHC: 33 g/dL (ref 30.0–36.0)
MCV: 87.6 fL (ref 80.0–100.0)
Platelets: 130 10*3/uL — ABNORMAL LOW (ref 150–400)
RBC: 4.53 MIL/uL (ref 3.87–5.11)
RDW: 14.5 % (ref 11.5–15.5)
WBC: 10.5 10*3/uL (ref 4.0–10.5)
nRBC: 0 % (ref 0.0–0.2)

## 2020-04-15 LAB — COMPREHENSIVE METABOLIC PANEL
ALT: 16 U/L (ref 0–44)
AST: 22 U/L (ref 15–41)
Albumin: 3 g/dL — ABNORMAL LOW (ref 3.5–5.0)
Alkaline Phosphatase: 54 U/L (ref 38–126)
Anion gap: 10 (ref 5–15)
BUN: 14 mg/dL (ref 8–23)
CO2: 26 mmol/L (ref 22–32)
Calcium: 8.3 mg/dL — ABNORMAL LOW (ref 8.9–10.3)
Chloride: 97 mmol/L — ABNORMAL LOW (ref 98–111)
Creatinine, Ser: 0.54 mg/dL (ref 0.44–1.00)
GFR calc Af Amer: >60 mL/min (ref >60)
GFR calc non Af Amer: >60 mL/min (ref >60)
Glucose, Bld: 100 mg/dL — ABNORMAL HIGH (ref 70–99)
Potassium: 3.3 mmol/L — ABNORMAL LOW (ref 3.5–5.1)
Sodium: 133 mmol/L — ABNORMAL LOW (ref 135–145)
Total Bilirubin: 0.9 mg/dL (ref 0.3–1.2)
Total Protein: 5.6 g/dL — ABNORMAL LOW (ref 6.5–8.1)

## 2020-04-15 MED ORDER — VANCOMYCIN 50 MG/ML ORAL SOLUTION: 125.0000 mg | Freq: Four times a day (QID) | ORAL | 0 refills | Status: DC

## 2020-04-15 NOTE — Discharge Summary (Signed)
Physician Discharge Summary  Kathryn Marquez OHY:[id number] DOB: Apr 08, 1921 DOA: 04/13/2020  PCP: Redmond School, MD  Admit date: 04/13/2020 Discharge date: 04/15/2020  Admitted From: Home Disposition:  Home   Recommendations for Outpatient Follow-up:  1. Follow up with PCP in 1-2 weeks 2. Please obtain BMP/CBC in one week   Home Health: HHPT   Discharge Condition: Stable CODE STATUS: FULL Diet recommendation: soft   Brief/Interim Summary: 86 year old female with a history of hyperlipidemia, stroke, chronic atrial fibrillation, hypertension presenting with diarrhea and abdominal pain.  The patient has also been complaining of some URI type symptoms and chest congestion.  She went to see her PCP recently who placed her on Augmentin.  Subsequently, the patient developed diarrhea with nausea and abdominal pain.  As result, the patient presented for further evaluation.  In the ED, the patient was noted to have WBC 16.4.  CT of the abdomen and pelvis revealed a 10 cm segment of the sigmoid colon with diffuse wall thickening and surrounding fat stranding.  There is no free air or loculated fluid collections.  The patient was initially started on ceftriaxone and metronidazole for presumptive diverticulitis.  Stool studies returned positive for C. difficile.  As result, the patient was transitioned to oral vancomycin.  Her IV ceftriaxone and metronidazole was discontinued.  Discharge Diagnoses:  C. difficile colitis -diarrhea and abd pain present prior to admsission -Continue oral vancomycin -diarrhea and abd pain improving -diet advanced which patient tolerated  Chronic atrial fibrillation -Rate controlled -Continue metoprolol succinate -Continue rivaroxaban  Essential hypertension -Hold amlodipine secondary to soft blood pressure -Holding HCTZ secondary to diarrhea and soft BPs  Hypokalemia -repleted  Deconditioning -HHPT  Discharge Instructions   Allergies as of 04/15/2020    No Known Allergies     Medication List    STOP taking these medications   amLODipine 5 MG tablet Commonly known as: NORVASC   amoxicillin-clavulanate 875-125 MG tablet Commonly known as: AUGMENTIN   diphenoxylate-atropine 2.5-0.025 MG tablet Commonly known as: LOMOTIL   hydrochlorothiazide 25 MG tablet Commonly known as: HYDRODIURIL     TAKE these medications   aspirin 81 MG chewable tablet Chew 2 tablets (162 mg total) by mouth every morning. What changed:   how much to take  when to take this   cetirizine 10 MG tablet Commonly known as: ZYRTEC Take 10 mg by mouth daily.   metoprolol succinate 25 MG 24 hr tablet Commonly known as: TOPROL-XL Take 1 tablet (25 mg total) by mouth daily. New blood pressure medicine.   vancomycin 50 mg/mL  oral solution Commonly known as: VANCOCIN Take 2.5 mLs (125 mg total) by mouth 4 (four) times daily.   Xarelto 15 MG Tabs tablet Generic drug: Rivaroxaban Take 15 mg by mouth daily.       No Known Allergies  Consultations:  none   Procedures/Studies: DG Chest 2 View  Result Date: 04/06/2020 CLINICAL DATA:  Cough and congestion. EXAM: CHEST - 2 VIEW COMPARISON:  Chest x-ray 08/27/2018, 02/05/2016. FINDINGS: Upper mediastinum and hilar structures normal. Prominent epicardial fat pad again noted. Heart size normal. Stable calcified left pleural plaques again noted. Stable left pleural thickening consistent scarring noted. Mild right costophrenic angle pleural thickening again noted consistent scarring. Stable left apical pleural thickening consistent scarring. No acute infiltrate noted. No pneumothorax. Stable thoracic spine scoliosis, osteopenia, and degenerative change. IMPRESSION: No acute cardiopulmonary disease. Stable calcified pleural plaques and scarring. Electronically Signed   By: Marcello Moores  Register   On: 04/06/2020 05:39  CT ABDOMEN PELVIS W CONTRAST  Result Date: 04/13/2020 CLINICAL DATA:  Acute abdominal pain  EXAM: CT ABDOMEN AND PELVIS WITH CONTRAST TECHNIQUE: Multidetector CT imaging of the abdomen and pelvis was performed using the standard protocol following bolus administration of intravenous contrast. CONTRAST:  155mL OMNIPAQUE IOHEXOL 300 MG/ML  SOLN COMPARISON:  None. FINDINGS: Lower chest: There is moderate cardiomegaly. Small bilateral pleural effusions are seen, right greater than left. There is rounded airspace opacity at the left lung base. Hepatobiliary: The liver is normal in density without focal abnormality.The main portal vein is patent. No evidence of calcified gallstones, gallbladder wall thickening or biliary dilatation. Pancreas: Unremarkable. No pancreatic ductal dilatation or surrounding inflammatory changes. Spleen: Normal in size without focal abnormality. Adrenals/Urinary Tract: Both adrenal glands appear normal. Multiple low-density lesions are seen throughout both kidneys the largest measuring 2 cm in the lower pole of the left kidney. Adjacent to the inferior pole of the right kidney there is a low-density cystic mass measuring 8.7 x 7.8 cm with which appears to either be extending from the lower pole or just adjacent. Stomach/Bowel: The stomach and small bowel are normal in appearance. There is a 10 cm segment of sigmoid colon with diffuse wall thickening surrounding fat stranding changes. No definite free air or loculated fluid collections are noted. There is a moderate amount of colonic stool present. Presacral edema is noted. Vascular/Lymphatic: There are no enlarged mesenteric, retroperitoneal, or pelvic lymph nodes. Scattered aortic atherosclerotic calcifications are seen without aneurysmal dilatation. Reproductive: The uterus and adnexa are unremarkable. Other: Within the right inguinal region there is a 3.6 cm low-density cystic mass. Musculoskeletal: No acute or significant osseous findings. IMPRESSION: Findings consistent with acute sigmoid colonic diverticulitis. No definite  loculated fluid collections or free air. Large cystic mass seen adjacent to the inferior pole the right kidney measuring 8.7 x 7.8 cm which appears to possibly be extending from the lower pole of the right kidney or just adjacent to. Likely represents a simple renal cyst or possible mesenteric cyst. Aortic Atherosclerosis (ICD10-I70.0). Electronically Signed   By: Prudencio Pair M.D.   On: 04/13/2020 23:17   DG Chest Port 1 View  Result Date: 04/13/2020 CLINICAL DATA:  Shortness of breath EXAM: PORTABLE CHEST 1 VIEW COMPARISON:  04/05/2020 FINDINGS: Cardiac shadow is enlarged but stable. Aortic calcifications are again seen. Chronic calcified pleural plaques are noted on the left with some scarring in the left base. Old rib fractures are noted on the left. No new focal infiltrate is seen. No acute bony abnormality is noted. IMPRESSION: Chronic changes similar to that seen on prior exam. No acute abnormality noted. Electronically Signed   By: Inez Catalina M.D.   On: 04/13/2020 23:00         Discharge Exam: Vitals:   04/15/20 0047 04/15/20 0420  BP: 95/66 101/67  Pulse: 91 82  Resp: 16 16  Temp: 98.3 F (36.8 C) 98.2 F (36.8 C)  SpO2: 97% 97%   Vitals:   04/14/20 1635 04/14/20 2027 04/15/20 0047 04/15/20 0420  BP: (!) 115/59 99/86 95/66  101/67  Pulse: (!) 102 98 91 82  Resp: 20 16 16 16   Temp: 100 F (37.8 C) 98.9 F (37.2 C) 98.3 F (36.8 C) 98.2 F (36.8 C)  TempSrc: Oral Oral Oral Oral  SpO2: 96% 96% 97% 97%  Weight:      Height:        General: Pt is alert, awake, not in acute distress Cardiovascular: RRR, S1/S2 +,  no rubs, no gallops Respiratory: CTA bilaterally, no wheezing, no rhonchi Abdominal: Soft, NT, ND, bowel sounds + Extremities: 1+LE edema, no cyanosis   The results of significant diagnostics from this hospitalization (including imaging, microbiology, ancillary and laboratory) are listed below for reference.    Significant Diagnostic Studies: DG Chest 2  View  Result Date: 04/06/2020 CLINICAL DATA:  Cough and congestion. EXAM: CHEST - 2 VIEW COMPARISON:  Chest x-ray 08/27/2018, 02/05/2016. FINDINGS: Upper mediastinum and hilar structures normal. Prominent epicardial fat pad again noted. Heart size normal. Stable calcified left pleural plaques again noted. Stable left pleural thickening consistent scarring noted. Mild right costophrenic angle pleural thickening again noted consistent scarring. Stable left apical pleural thickening consistent scarring. No acute infiltrate noted. No pneumothorax. Stable thoracic spine scoliosis, osteopenia, and degenerative change. IMPRESSION: No acute cardiopulmonary disease. Stable calcified pleural plaques and scarring. Electronically Signed   By: Marcello Moores  Register   On: 04/06/2020 05:39   CT ABDOMEN PELVIS W CONTRAST  Result Date: 04/13/2020 CLINICAL DATA:  Acute abdominal pain EXAM: CT ABDOMEN AND PELVIS WITH CONTRAST TECHNIQUE: Multidetector CT imaging of the abdomen and pelvis was performed using the standard protocol following bolus administration of intravenous contrast. CONTRAST:  187mL OMNIPAQUE IOHEXOL 300 MG/ML  SOLN COMPARISON:  None. FINDINGS: Lower chest: There is moderate cardiomegaly. Small bilateral pleural effusions are seen, right greater than left. There is rounded airspace opacity at the left lung base. Hepatobiliary: The liver is normal in density without focal abnormality.The main portal vein is patent. No evidence of calcified gallstones, gallbladder wall thickening or biliary dilatation. Pancreas: Unremarkable. No pancreatic ductal dilatation or surrounding inflammatory changes. Spleen: Normal in size without focal abnormality. Adrenals/Urinary Tract: Both adrenal glands appear normal. Multiple low-density lesions are seen throughout both kidneys the largest measuring 2 cm in the lower pole of the left kidney. Adjacent to the inferior pole of the right kidney there is a low-density cystic mass measuring  8.7 x 7.8 cm with which appears to either be extending from the lower pole or just adjacent. Stomach/Bowel: The stomach and small bowel are normal in appearance. There is a 10 cm segment of sigmoid colon with diffuse wall thickening surrounding fat stranding changes. No definite free air or loculated fluid collections are noted. There is a moderate amount of colonic stool present. Presacral edema is noted. Vascular/Lymphatic: There are no enlarged mesenteric, retroperitoneal, or pelvic lymph nodes. Scattered aortic atherosclerotic calcifications are seen without aneurysmal dilatation. Reproductive: The uterus and adnexa are unremarkable. Other: Within the right inguinal region there is a 3.6 cm low-density cystic mass. Musculoskeletal: No acute or significant osseous findings. IMPRESSION: Findings consistent with acute sigmoid colonic diverticulitis. No definite loculated fluid collections or free air. Large cystic mass seen adjacent to the inferior pole the right kidney measuring 8.7 x 7.8 cm which appears to possibly be extending from the lower pole of the right kidney or just adjacent to. Likely represents a simple renal cyst or possible mesenteric cyst. Aortic Atherosclerosis (ICD10-I70.0). Electronically Signed   By: Prudencio Pair M.D.   On: 04/13/2020 23:17   DG Chest Port 1 View  Result Date: 04/13/2020 CLINICAL DATA:  Shortness of breath EXAM: PORTABLE CHEST 1 VIEW COMPARISON:  04/05/2020 FINDINGS: Cardiac shadow is enlarged but stable. Aortic calcifications are again seen. Chronic calcified pleural plaques are noted on the left with some scarring in the left base. Old rib fractures are noted on the left. No new focal infiltrate is seen. No acute bony abnormality is noted.  IMPRESSION: Chronic changes similar to that seen on prior exam. No acute abnormality noted. Electronically Signed   By: Inez Catalina M.D.   On: 04/13/2020 23:00     Microbiology: Recent Results (from the past 240 hour(s))  SARS  Coronavirus 2 by RT PCR (hospital order, performed in [facility] hospital lab) Nasopharyngeal     Status: None   Collection Time: 04/13/20 11:59 PM   Specimen: Nasopharyngeal  Result Value Ref Range Status   SARS Coronavirus 2 NEGATIVE NEGATIVE Final    Comment: (NOTE) SARS-CoV-2 target nucleic acids are NOT DETECTED. The SARS-CoV-2 RNA is generally detectable in upper and lower respiratory specimens during the acute phase of infection. The lowest concentration of SARS-CoV-2 viral copies this assay can detect is 250 copies / mL. A negative result does not preclude SARS-CoV-2 infection and should not be used as the sole basis for treatment or other patient management decisions.  A negative result may occur with improper specimen collection / handling, submission of specimen other than nasopharyngeal swab, presence of viral mutation(s) within the areas targeted by this assay, and inadequate number of viral copies (<250 copies / mL). A negative result must be combined with clinical observations, patient history, and epidemiological information. Fact Sheet for Patients:   StrictlyIdeas.no Fact Sheet for Healthcare Providers: BankingDealers.co.za This test is not yet approved or cleared  by the [place] FDA and has been authorized for detection and/or diagnosis of SARS-CoV-2 by FDA under an Emergency Use Authorization (EUA).  This EUA will remain in effect (meaning this test can be used) for the duration of the COVID-19 declaration under Section 564(b)(1) of the Act, 21 U.S.C. section 360bbb-3(b)(1), unless the authorization is terminated or revoked sooner. Performed at [facility], [address], [place], [place] [address]   C Difficile Quick Screen w PCR reflex     Status: Abnormal   Collection Time: 04/14/20  3:39 PM   Specimen: STOOL  Result Value Ref Range Status   C Diff antigen POSITIVE (A) NEGATIVE Final   C Diff toxin  POSITIVE (A) NEGATIVE Final   C Diff interpretation Toxin producing C. difficile detected.  Final    Comment: CRITICAL RESULT CALLED TO, READ BACK BY AND VERIFIED WITH: MILLS,M ON 04/14/20 AT 1800 BY LOY,C Performed at [facility], [address], [place], [place] [address]      Labs: Basic Metabolic Panel: Recent Labs  Lab 04/13/20 1840 04/13/20 1840 04/14/20 0327 04/15/20 0705  NA 133*  --  135 133*  K 3.2*   < > 4.1 3.3*  CL 94*  --  98 97*  CO2 28  --  27 26  GLUCOSE 118*  --  123* 100*  BUN 16  --  16 14  CREATININE 0.62  --  0.58 0.54  CALCIUM 8.9  --  8.4* 8.3*   < > = values in this interval not displayed.   Liver Function Tests: Recent Labs  Lab 04/13/20 1840 04/15/20 0705  AST 19 22  ALT 15 16  ALKPHOS 61 54  BILITOT 1.9* 0.9  PROT 7.0 5.6*  ALBUMIN 3.9 3.0*   Recent Labs  Lab 04/13/20 1840  LIPASE 19   No results for input(s): AMMONIA in the last 168 hours. CBC: Recent Labs  Lab 04/13/20 1840 04/14/20 0327 04/15/20 0705  WBC 16.4* 12.8* 10.5  HGB 14.1 12.8 13.1  HCT 43.4 39.9 39.7  MCV 88.9 89.9 87.6  PLT 147* 120* 130*   Cardiac Enzymes: No  results for input(s): CKTOTAL, CKMB, CKMBINDEX, TROPONINI in the last 168 hours. BNP: Invalid input(s): POCBNP CBG: No results for input(s): GLUCAP in the last 168 hours.  Time coordinating discharge:  36 minutes  Signed:  Orson Eva, DO [facility] Pager: [phone] 04/15/2020, 10:37 AM

## 2020-04-15 NOTE — Plan of Care (Signed)
  Problem: Acute Rehab PT Goals(only PT should resolve) Goal: Patient Will Transfer Sit To/From Stand Flowsheets (Taken 04/15/2020 1349) Patient will transfer sit to/from stand: with modified independence Goal: Pt Will Transfer Bed To Chair/Chair To Bed Flowsheets (Taken 04/15/2020 1349) Pt will Transfer Bed to Chair/Chair to Bed: with modified independence Note: And RW Goal: Pt Will Ambulate Flowsheets (Taken 04/15/2020 1349) Pt will Ambulate:  50 feet  with supervision  with rolling walker   1:50 PM, 04/15/20 Jerene Pitch, DPT Physical Therapy with [facility]  [phone] office

## 2020-04-15 NOTE — Progress Notes (Addendum)
3pm: CSW spoke with patient's son Marc Morgans who states he is in the patient's room and will be providing her with transportation home once discharged. Lonnie did not have questions or concerns for CSW.  11am: CSW spoke with Dian Situ at [facility] who states the agency cannot accept [employer] at this time.  CSW spoke with [place] at [facility] who states the agency can accept the patient for PT services but the first appointment will not be available until next week, but will attempts will be made to schedule the initial appointment sooner if able.  CSW informed Dr. Carles Collet of information. Patient to discharge home with supervision from her daughter.  Madilyn Fireman, MSW, LCSW-A Transitions of Care  Clinical Social Worker  [facility] Emergency Departments  Medical ICU [phone]

## 2020-04-15 NOTE — Evaluation (Signed)
Physical Therapy Evaluation Patient Details Name: Kathryn Marquez MRN: [id number] DOB: [birth year] Today's Date: 04/15/2020   History of Present Illness  86 year old female with a history of hyperlipidemia, stroke, chronic atrial fibrillation, hypertension presenting with diarrhea and abdominal pain.  The patient has also been complaining of some URI type symptoms and chest congestion.  She went to see her PCP recently who placed her on Augmentin.  Subsequently, the patient developed diarrhea with nausea and abdominal pain.  As result, the patient presented for further evaluation.  In the ED, the patient was noted to have WBC 16.4.  CT of the abdomen and pelvis revealed a 10 cm segment of the sigmoid colon with diffuse wall thickening and surrounding fat stranding.  There is no free air or loculated fluid collections.  The patient was initially started on ceftriaxone and metronidazole for presumptive diverticulitis.  Stool studies returned positive for C. difficile.  As result, the patient was transitioned to oral vancomycin.  Her IV ceftriaxone and metronidazole was discontinued.   Clinical Impression   Patient very agreeable to therapy and able to perform bed mobilities and transfers with modified independnece. Patient had urge to go to bathroom (bowel movement) upon standing and initially did not think she could make it to toilet. Sat on bed pan with no bowel movement. Able to walk to toilet with supervision and lots of verbal cues to stay in walker. Patient able to ambulate back to chair with only supervision and walker. Patient does not have a walker at home but lives with daughter who is able to help as needed.  Patient will continue to benefit from skilled physical therapy in hospital and recommended venue below to increase strength, balance, endurance for safe ADLs and gait.    Follow Up Recommendations Home health PT;Supervision for mobility/OOB    Equipment Recommendations  Rolling walker with  5" wheels    Recommendations for Other Services       Precautions / Restrictions Precautions Precautions: Fall Restrictions Weight Bearing Restrictions: No      Mobility  Bed Mobility Overal bed mobility: Modified Independent                Transfers Overall transfer level: Modified independent Equipment used: Rolling walker (2 wheeled)             General transfer comment: slow movements, verbal cues to stand up tall and place hands on walker  Ambulation/Gait Ambulation/Gait assistance: Supervision Gait Distance (Feet): 15 Feet Assistive device: Rolling walker (2 wheeled) Gait Pattern/deviations: Trunk flexed;Decreased stride length Gait velocity: decreased   General Gait Details: slow movements, tends to push walker ahead of self  Stairs            Wheelchair Mobility    Modified Rankin (Stroke Patients Only)       Balance Overall balance assessment: Mild deficits observed, not formally tested                                           Pertinent Vitals/Pain Pain Assessment: No/denies pain    Home Living Family/patient expects to be discharged to:: Private residence Living Arrangements: Children Available Help at Discharge: Family;Available 24 hours/day Type of Home: House Home Access: Stairs to enter   [employer] of Steps: 1 step Home Layout: Laundry or work area in basement;Two level;Able to live on main level with bedroom/bathroom Home Equipment: Kasandra Knudsen -  single point      Prior Function Level of Independence: Independent with assistive device(s)         Comments: was using cane for ambulation, independent in self care     Hand Dominance        Extremity/Trunk Assessment        Lower Extremity Assessment Lower Extremity Assessment: Generalized weakness       Communication   Communication: No difficulties  Cognition Arousal/Alertness: Awake/alert Behavior During Therapy: WFL for tasks  assessed/performed Overall Cognitive Status: Within Functional Limits for tasks assessed                                        General Comments      Exercises General Exercises - Lower Extremity Long Arc Quad: AROM;Seated;Both;10 reps;Strengthening Hip Flexion/Marching: AROM;Seated;Strengthening;Both;10 reps Toe Raises: AROM;Seated;Both;10 reps Heel Raises: AROM;Seated;Both;10 reps   Assessment/Plan    PT Assessment Patient needs continued PT services  PT Problem List Decreased strength;Decreased activity tolerance;Decreased balance;Decreased mobility       PT Treatment Interventions DME instruction;Gait training;Stair training;Functional mobility training;Therapeutic activities;Therapeutic exercise;Manual techniques;Patient/family education;Balance training;Neuromuscular re-education    PT Goals (Current goals can be found in the Care Plan section)  Acute Rehab PT Goals Patient Stated Goal: to go home PT Goal Formulation: With patient/family Time For Goal Achievement: 04/29/20 Potential to Achieve Goals: Good    Frequency Min 3X/week   Barriers to discharge   patient needs RW    Co-evaluation               AM-PAC PT "6 Clicks" Mobility  Outcome Measure Help needed turning from your back to your side while in a flat bed without using bedrails?: None Help needed moving from lying on your back to sitting on the side of a flat bed without using bedrails?: None Help needed moving to and from a bed to a chair (including a wheelchair)?: A Little Help needed standing up from a chair using your arms (e.g., wheelchair or bedside chair)?: None Help needed to walk in hospital room?: A Little Help needed climbing 3-5 steps with a railing? : A Lot 6 Click Score: 20    End of Session Equipment Utilized During Treatment: Gait belt Activity Tolerance: Patient tolerated treatment well;Patient limited by fatigue Patient left: in chair;with family/visitor  present;with call bell/phone within reach Nurse Communication: Mobility status PT Visit Diagnosis: Unsteadiness on feet (R26.81);Other abnormalities of gait and mobility (R26.89);Muscle weakness (generalized) (M62.81);Difficulty in walking, not elsewhere classified (R26.2)    Time: 8841-6606 PT Time Calculation (min) (ACUTE ONLY): 41 min   Charges:   PT Evaluation $PT Eval Low Complexity: 1 Low PT Treatments $Therapeutic Exercise: 8-22 mins       1:48 PM, 04/15/20 Jerene Pitch, DPT Physical Therapy with [facility]  [phone] office

## 2020-04-24 DIAGNOSIS — Z6824 Body mass index (BMI) 24.0-24.9, adult: Secondary | ICD-10-CM | POA: Diagnosis not present

## 2020-04-24 DIAGNOSIS — I4891 Unspecified atrial fibrillation: Secondary | ICD-10-CM | POA: Diagnosis not present

## 2020-04-24 DIAGNOSIS — Z1389 Encounter for screening for other disorder: Secondary | ICD-10-CM | POA: Diagnosis not present

## 2020-04-24 DIAGNOSIS — A0472 Enterocolitis due to Clostridium difficile, not specified as recurrent: Secondary | ICD-10-CM | POA: Diagnosis not present

## 2020-04-24 DIAGNOSIS — E782 Mixed hyperlipidemia: Secondary | ICD-10-CM | POA: Diagnosis not present

## 2020-04-24 DIAGNOSIS — Z0001 Encounter for general adult medical examination with abnormal findings: Secondary | ICD-10-CM | POA: Diagnosis not present

## 2020-04-24 DIAGNOSIS — E7849 Other hyperlipidemia: Secondary | ICD-10-CM | POA: Diagnosis not present

## 2020-04-24 DIAGNOSIS — R197 Diarrhea, unspecified: Secondary | ICD-10-CM | POA: Diagnosis not present

## 2020-04-24 DIAGNOSIS — I10 Essential (primary) hypertension: Secondary | ICD-10-CM | POA: Diagnosis not present

## 2020-05-08 ENCOUNTER — Inpatient Hospital Stay (HOSPITAL_COMMUNITY)
Admission: EM | Admit: 2020-05-08 | Discharge: 2020-05-11 | DRG: 371 | Disposition: A | Discharge status: Home / Self Care | Payer: PPO | Attending: Internal Medicine | Admitting: Internal Medicine

## 2020-05-08 ENCOUNTER — Emergency Department (HOSPITAL_COMMUNITY): Payer: PPO

## 2020-05-08 ENCOUNTER — Encounter (HOSPITAL_COMMUNITY): Payer: Self-pay | Admitting: Emergency Medicine

## 2020-05-08 ENCOUNTER — Other Ambulatory Visit: Payer: Self-pay

## 2020-05-08 DIAGNOSIS — R0902 Hypoxemia: Secondary | ICD-10-CM | POA: Diagnosis not present

## 2020-05-08 DIAGNOSIS — Z7982 Long term (current) use of aspirin: Secondary | ICD-10-CM | POA: Diagnosis not present

## 2020-05-08 DIAGNOSIS — I10 Essential (primary) hypertension: Secondary | ICD-10-CM | POA: Diagnosis not present

## 2020-05-08 DIAGNOSIS — E785 Hyperlipidemia, unspecified: Secondary | ICD-10-CM | POA: Diagnosis present

## 2020-05-08 DIAGNOSIS — R109 Unspecified abdominal pain: Secondary | ICD-10-CM | POA: Diagnosis present

## 2020-05-08 DIAGNOSIS — R140 Abdominal distension (gaseous): Secondary | ICD-10-CM | POA: Diagnosis not present

## 2020-05-08 DIAGNOSIS — E860 Dehydration: Secondary | ICD-10-CM

## 2020-05-08 DIAGNOSIS — G9341 Metabolic encephalopathy: Secondary | ICD-10-CM | POA: Diagnosis not present

## 2020-05-08 DIAGNOSIS — I517 Cardiomegaly: Secondary | ICD-10-CM | POA: Diagnosis not present

## 2020-05-08 DIAGNOSIS — R4020 Unspecified coma: Secondary | ICD-10-CM | POA: Diagnosis not present

## 2020-05-08 DIAGNOSIS — E876 Hypokalemia: Secondary | ICD-10-CM | POA: Diagnosis present

## 2020-05-08 DIAGNOSIS — I4891 Unspecified atrial fibrillation: Secondary | ICD-10-CM | POA: Diagnosis not present

## 2020-05-08 DIAGNOSIS — Z8673 Personal history of transient ischemic attack (TIA), and cerebral infarction without residual deficits: Secondary | ICD-10-CM

## 2020-05-08 DIAGNOSIS — A0472 Enterocolitis due to Clostridium difficile, not specified as recurrent: Secondary | ICD-10-CM | POA: Diagnosis not present

## 2020-05-08 DIAGNOSIS — Z20822 Contact with and (suspected) exposure to covid-19: Secondary | ICD-10-CM | POA: Diagnosis not present

## 2020-05-08 DIAGNOSIS — E7849 Other hyperlipidemia: Secondary | ICD-10-CM | POA: Diagnosis not present

## 2020-05-08 DIAGNOSIS — A0471 Enterocolitis due to Clostridium difficile, recurrent: Principal | ICD-10-CM | POA: Diagnosis present

## 2020-05-08 DIAGNOSIS — J929 Pleural plaque without asbestos: Secondary | ICD-10-CM | POA: Diagnosis not present

## 2020-05-08 DIAGNOSIS — Z66 Do not resuscitate: Secondary | ICD-10-CM | POA: Diagnosis present

## 2020-05-08 DIAGNOSIS — R52 Pain, unspecified: Secondary | ICD-10-CM | POA: Diagnosis not present

## 2020-05-08 DIAGNOSIS — K5939 Other megacolon: Secondary | ICD-10-CM | POA: Diagnosis not present

## 2020-05-08 DIAGNOSIS — I482 Chronic atrial fibrillation, unspecified: Secondary | ICD-10-CM | POA: Diagnosis present

## 2020-05-08 DIAGNOSIS — Z79899 Other long term (current) drug therapy: Secondary | ICD-10-CM | POA: Diagnosis not present

## 2020-05-08 DIAGNOSIS — K5931 Toxic megacolon: Secondary | ICD-10-CM | POA: Diagnosis not present

## 2020-05-08 DIAGNOSIS — Z03818 Encounter for observation for suspected exposure to other biological agents ruled out: Secondary | ICD-10-CM | POA: Diagnosis not present

## 2020-05-08 DIAGNOSIS — R1084 Generalized abdominal pain: Secondary | ICD-10-CM | POA: Diagnosis not present

## 2020-05-08 DIAGNOSIS — K529 Noninfective gastroenteritis and colitis, unspecified: Secondary | ICD-10-CM | POA: Diagnosis present

## 2020-05-08 DIAGNOSIS — Z7901 Long term (current) use of anticoagulants: Secondary | ICD-10-CM | POA: Diagnosis not present

## 2020-05-08 DIAGNOSIS — Z8249 Family history of ischemic heart disease and other diseases of the circulatory system: Secondary | ICD-10-CM

## 2020-05-08 LAB — COMPREHENSIVE METABOLIC PANEL
ALT: 18 U/L (ref 0–44)
AST: 25 U/L (ref 15–41)
Albumin: 3.9 g/dL (ref 3.5–5.0)
Alkaline Phosphatase: 71 U/L (ref 38–126)
Anion gap: 12 (ref 5–15)
BUN: 15 mg/dL (ref 8–23)
CO2: 27 mmol/L (ref 22–32)
Calcium: 8.9 mg/dL (ref 8.9–10.3)
Chloride: 100 mmol/L (ref 98–111)
Creatinine, Ser: 0.67 mg/dL (ref 0.44–1.00)
GFR calc Af Amer: >60 mL/min (ref >60)
GFR calc non Af Amer: >60 mL/min (ref >60)
Glucose, Bld: 125 mg/dL — ABNORMAL HIGH (ref 70–99)
Potassium: 4 mmol/L (ref 3.5–5.1)
Sodium: 139 mmol/L (ref 135–145)
Total Bilirubin: 1.7 mg/dL — ABNORMAL HIGH (ref 0.3–1.2)
Total Protein: 6.7 g/dL (ref 6.5–8.1)

## 2020-05-08 LAB — URINALYSIS, ROUTINE W REFLEX MICROSCOPIC
Bacteria, UA: NONE SEEN
Bilirubin Urine: NEGATIVE
Glucose, UA: NEGATIVE mg/dL
Ketones, ur: 5 mg/dL — AB
Leukocytes,Ua: NEGATIVE
Nitrite: NEGATIVE
Protein, ur: NEGATIVE mg/dL
Specific Gravity, Urine: 1.019 (ref 1.005–1.030)
pH: 5 (ref 5.0–8.0)

## 2020-05-08 LAB — SARS CORONAVIRUS 2 BY RT PCR (HOSPITAL ORDER, PERFORMED IN ~~LOC~~ HOSPITAL LAB): SARS Coronavirus 2: NEGATIVE

## 2020-05-08 LAB — CBC WITH DIFFERENTIAL/PLATELET
Abs Immature Granulocytes: 0.03 10*3/uL (ref 0.00–0.07)
Basophils Absolute: 0 10*3/uL (ref 0.0–0.1)
Basophils Relative: 0 %
Eosinophils Absolute: 0 10*3/uL (ref 0.0–0.5)
Eosinophils Relative: 0 %
HCT: 48.1 % — ABNORMAL HIGH (ref 36.0–46.0)
Hemoglobin: 15.1 g/dL — ABNORMAL HIGH (ref 12.0–15.0)
Immature Granulocytes: 0 %
Lymphocytes Relative: 8 %
Lymphs Abs: 0.8 10*3/uL (ref 0.7–4.0)
MCH: 29.5 pg (ref 26.0–34.0)
MCHC: 31.4 g/dL (ref 30.0–36.0)
MCV: 94.1 fL (ref 80.0–100.0)
Monocytes Absolute: 0.6 10*3/uL (ref 0.1–1.0)
Monocytes Relative: 5 %
Neutro Abs: 9 10*3/uL — ABNORMAL HIGH (ref 1.7–7.7)
Neutrophils Relative %: 87 %
Platelets: 102 10*3/uL — ABNORMAL LOW (ref 150–400)
RBC: 5.11 MIL/uL (ref 3.87–5.11)
RDW: 15.9 % — ABNORMAL HIGH (ref 11.5–15.5)
WBC: 10.4 10*3/uL (ref 4.0–10.5)
nRBC: 0 % (ref 0.0–0.2)

## 2020-05-08 LAB — TROPONIN I (HIGH SENSITIVITY): Troponin I (High Sensitivity): 4 ng/L (ref <18)

## 2020-05-08 LAB — MAGNESIUM: Magnesium: 1.6 mg/dL — ABNORMAL LOW (ref 1.7–2.4)

## 2020-05-08 LAB — LACTIC ACID, PLASMA
Lactic Acid, Venous: 1.4 mmol/L (ref 0.5–1.9)
Lactic Acid, Venous: 1.2 mmol/L (ref 0.5–1.9)

## 2020-05-08 MED ORDER — SODIUM CHLORIDE 0.9 % IV SOLN: INTRAVENOUS | Status: DC

## 2020-05-08 MED ORDER — ACETAMINOPHEN 325 MG PO TABS
650.0000 mg | ORAL_TABLET | Freq: Once | ORAL | Status: AC
  Administered 2020-05-08: 650 mg via ORAL
  Filled 2020-05-08: qty 2

## 2020-05-08 MED ORDER — NYSTATIN 100000 UNIT/GM EX POWD
Freq: Three times a day (TID) | CUTANEOUS | Status: DC
  Filled 2020-05-08 (×2): qty 15
  Filled 2020-05-08: qty 30

## 2020-05-08 MED ORDER — IOHEXOL 300 MG/ML  SOLN
75.0000 mL | Freq: Once | INTRAMUSCULAR | Status: AC | PRN
  Administered 2020-05-08: 75 mL via INTRAVENOUS

## 2020-05-08 MED ORDER — MAGNESIUM SULFATE 2 GM/50ML IV SOLN
2.0000 g | Freq: Once | INTRAVENOUS | Status: AC
  Administered 2020-05-09: 2 g via INTRAVENOUS
  Filled 2020-05-08: qty 50

## 2020-05-08 NOTE — ED Provider Notes (Signed)
[facility] EMERGENCY DEPARTMENT Provider Note   CSN: [id number] Arrival date & time: 05/08/20  1706     History Chief Complaint  Patient presents with  . Abdominal Pain    Kathryn Marquez is a 86 y.o. female.  HPI   This patient is a 86 year old female, she has a prior history of a stroke, hypertension, chronic A. fib.  She was recently diagnosed with C. difficile colitis, diverticulitis and was admitted to the hospital on June 3, after being treated with Augmentin for diverticulitis as an outpatient.  While she was in the hospital she had a CT scan that showed some areas of thickening of her sigmoid colon, she was tested positive for C. difficile and ceftriaxone and metronidazole were discontinued, she was started on oral vancomycin and eventually discharged on 5 June.  She is taking her medications, EMS states that family reports that the patient has had decreased oral intake, she is becoming more lethargic , Seems to be having episodes of confusion.  She was given 500 mL of normal saline by IV prehospital.  The patient is very weak and not giving very much history, level 5 caveat applies  Past Medical History:  Diagnosis Date  . Acute embolic stroke ([employer]) 8/82/8003  . Dyslipidemia, goal LDL below 100 04/30/2012  . Dysrhythmia    Chronic atrial fib  . Hypertension     Patient Active Problem List   Diagnosis Date Noted  . C. difficile colitis 04/15/2020  . Hypokalemia 04/14/2020  . Abdominal pain 04/14/2020  . Acute diverticulitis 04/13/2020  . Head injury 02/05/2016  . Fall involving sidewalk curb 02/05/2016  . Facial trauma 02/05/2016  . Facial bruising 02/05/2016  . Fall 02/05/2016  . Dyslipidemia, goal LDL below 100 04/30/2012  . TIA (transient ischemic attack) 04/29/2012  . 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 Hypertension 04/29/2012  . Chronic atrial fibrillation ([place]) 04/29/2012  . CLOSED COLLES FRACTURE 08/02/2010    History reviewed.  No pertinent surgical history.   OB History   No obstetric history on file.     Family History  Problem Relation Age of Onset  . Stroke Other   . Cancer Other   . Heart attack Mother   . Leukemia Sister   . Cancer Brother     Social History   Tobacco Use  . Smoking status: Never Smoker  . Smokeless tobacco: Never Used  Vaping Use  . Vaping Use: Never used  Substance Use Topics  . Alcohol use: No  . Drug use: No    Home Medications Prior to Admission medications   Medication Sig Start Date End Date Taking? Authorizing Provider  aspirin 81 MG chewable tablet Chew 2 tablets (162 mg total) by mouth every morning. Patient taking differently: Chew 81 mg by mouth every evening.  04/30/12  Yes Rexene Alberts, MD  cetirizine (ZYRTEC) 10 MG tablet Take 10 mg by mouth daily.   Yes [provider]  diphenoxylate-atropine (LOMOTIL) 2.5-0.025 MG tablet Take 1-2 tablets by mouth 4 (four) times daily as needed for diarrhea or loose stools.   Yes [provider]  metoprolol succinate (TOPROL-XL) 25 MG 24 hr tablet Take 1 tablet (25 mg total) by mouth daily. New blood pressure medicine. 04/30/12 05/08/20 Yes Rexene Alberts, MD  XARELTO 15 MG TABS tablet Take 15 mg by mouth daily. 12/14/19  Yes [provider]  hydrocortisone (ANUSOL-HC) 25 MG suppository Place 25 mg rectally daily as needed. Patient not  taking: Reported on 05/08/2020 04/19/20   [provider]  vancomycin (VANCOCIN) 50 mg/mL oral solution Take 2.5 mLs (125 mg total) by mouth 4 (four) times daily. Patient not taking: Reported on 05/08/2020 04/15/20   Orson Eva, MD    Allergies    Patient has no known allergies.  Review of Systems   Review of Systems  Unable to perform ROS: Acuity of condition    Physical Exam Updated Vital Signs BP (!) 143/82 (BP Location: Left Arm)   Pulse 84   Temp (!) 101.6 F (38.7 C) (Rectal)   Resp 20   Ht 1.626 m (5\' 4" )   Wt 61.1 kg   SpO2 95%   BMI 23.14  kg/m   Physical Exam Vitals and nursing note reviewed.  Constitutional:      General: She is not in acute distress.    Appearance: She is well-developed. She is ill-appearing.  HENT:     Head: Normocephalic and atraumatic.     Mouth/Throat:     Mouth: Mucous membranes are dry.     Pharynx: No oropharyngeal exudate.  Eyes:     General: No scleral icterus.       Right eye: No discharge.        Left eye: No discharge.     Conjunctiva/sclera: Conjunctivae normal.     Pupils: Pupils are equal, round, and reactive to light.  Neck:     Thyroid: No thyromegaly.     Vascular: No JVD.  Cardiovascular:     Rate and Rhythm: Normal rate and regular rhythm.     Heart sounds: Normal heart sounds. No murmur heard.  No friction rub. No gallop.   Pulmonary:     Effort: Pulmonary effort is normal. No respiratory distress.     Breath sounds: Normal breath sounds. No wheezing or rales.  Abdominal:     General: There is distension.     Palpations: Abdomen is soft. There is no mass.     Tenderness: There is abdominal tenderness ( Mild nonlocal).     Comments: The abdomen is distended and has tympanic sounds to percussion.  Bowel sounds are decreased  Musculoskeletal:        General: No tenderness. Normal range of motion.     Cervical back: Normal range of motion and neck supple.  Lymphadenopathy:     Cervical: No cervical adenopathy.  Skin:    General: Skin is warm and dry.     Findings: No erythema or rash.  Neurological:     Mental Status: She is alert.     Coordination: Coordination normal.     Comments: Generally weak, she is alert, able to follow commands but severely weak, speaks very slowly and quietly  Psychiatric:        Behavior: Behavior normal.     ED Results / Procedures / Treatments   Labs (all labs ordered are listed, but only abnormal results are displayed) Labs Reviewed  CBC WITH DIFFERENTIAL/PLATELET - Abnormal; Notable for the following components:      Result  Value   Hemoglobin 15.1 (*)    HCT 48.1 (*)    RDW 15.9 (*)    Platelets 102 (*)    Neutro Abs 9.0 (*)    All other components within normal limits  COMPREHENSIVE METABOLIC PANEL - Abnormal; Notable for the following components:   Glucose, Bld 125 (*)    Total Bilirubin 1.7 (*)    All other components within normal limits  URINALYSIS,  ROUTINE W REFLEX MICROSCOPIC - Abnormal; Notable for the following components:   Hgb urine dipstick MODERATE (*)    Ketones, ur 5 (*)    All other components within normal limits  MAGNESIUM - Abnormal; Notable for the following components:   Magnesium 1.6 (*)    All other components within normal limits  URINE CULTURE  CULTURE, BLOOD (ROUTINE X 2)  CULTURE, BLOOD (ROUTINE X 2)  SARS CORONAVIRUS 2 BY RT PCR (HOSPITAL ORDER, PERFORMED IN [place] [facility])  LACTIC ACID, PLASMA  LACTIC ACID, PLASMA  TROPONIN I (HIGH SENSITIVITY)    EKG EKG Interpretation  Date/Time:  Monday May 08 2020 17:23:04 EDT Ventricular Rate:  78 PR Interval:    QRS Duration: 90 QT Interval:  395 QTC Calculation: 450 R Axis:   111 Text Interpretation: Atrial fibrillation Right axis deviation Borderline low voltage, extremity leads Probable anteroseptal infarct, old Borderline repolarization abnormality Since last tracing rate slower Confirmed by Noemi Chapel [phone]) on 05/08/2020 5:32:47 PM   Radiology DG ABD ACUTE 2+V W 1V CHEST  Result Date: 05/08/2020 CLINICAL DATA:  Distended abdomen EXAM: DG ABDOMEN ACUTE W/ 1V CHEST COMPARISON:  CT 04/13/2020 FINDINGS: Cardiomegaly. Calcified pleural plaques laterally in the left lower hemithorax. Nonobstructive bowel gas pattern. No free air organomegaly. No suspicious calcification. IMPRESSION: No evidence of bowel obstruction or free air. Cardiomegaly.  Calcified left lower chest pleural plaques. Electronically Signed   By: Rolm Baptise M.D.   On: 05/08/2020 19:07    Procedures Procedures (including critical care  time)  Medications Ordered in ED Medications  0.9 %  sodium chloride infusion ( Intravenous New Bag/Given 05/08/20 1834)  acetaminophen (TYLENOL) tablet 650 mg (650 mg Oral Given 05/08/20 1926)    ED Course  I have reviewed the triage vital signs and the nursing notes.  Pertinent labs & imaging results that were available during my care of the patient were reviewed by me and considered in my medical decision making (see chart for details).    MDM Rules/Calculators/A&P                          Generally this patient appears very deconditioned, dehydrated and very weak.  Her vital signs are unremarkable except for a temperature of 99.8 which is concerning and that she is still having diarrhea.  Will perform some initial evaluation with this acute abdominal series to evaluate for bowel obstruction given her distended tympanic abdomen, labs, urinalysis and saline, patient is agreeable  The patient has had further imaging including acute abdominal series lactic acid, white blood cell count which was normal, metabolic panel which is unremarkable but has developed a fever.  She appears generally weak ill and dehydrated.  I have asked the hospitalist to admit this patient to the hospital.  Possibly has ongoing C. difficile colitis.  Final Clinical Impression(s) / ED Diagnoses Final diagnoses:  C. difficile colitis  Dehydration      Noemi Chapel, MD 05/08/20 2232

## 2020-05-08 NOTE — H&P (Addendum)
TRH H&P    Patient Demographics:    Kathryn Marquez, is a 86 y.o. female  MRN: [id number]  DOB - [birth year]  Admit Date - 05/08/2020  Referring MD/NP/PA: Noemi Chapel  Outpatient Primary MD for the patient is Redmond School, MD  Patient coming from: Home  Chief complaint-diarrhea   HPI:    Kathryn Marquez  is a 86 y.o. female, with medical history of chronic atrial fibrillation, on anticoagulation with Eliquis, hypertension, hyperlipidemia recent diagnosis of C. difficile colitis and she was admitted to the hospital from June 3 to June 5.  She was discharged on 15 days of vancomycin.  As per patient's daughter, patient's diarrhea had improved she received last dose of vancomycin a week ago.  This morning patient started having diarrhea again.  She was brought to the hospital and found to be febrile.  She had multiple episodes of loose bowel movements in the hospital. Patient also complains of left lower quadrant abdominal pain. Denies chest pain or shortness of breath. Denies vomiting, but complains of nausea. Denies dysuria    Review of systems:    In addition to the HPI above,    All other systems reviewed and are negative.    Past History of the following :    Past Medical History:  Diagnosis Date  . Acute embolic stroke ([place]) 7/56/4332  . Dyslipidemia, goal LDL below 100 04/30/2012  . Dysrhythmia    Chronic atrial fib  . Hypertension       History reviewed. No pertinent surgical history.    Social History:      Social History   Tobacco Use  . Smoking status: Never Smoker  . Smokeless tobacco: Never Used  Substance Use Topics  . Alcohol use: No       Family History :     Family History  Problem Relation Age of Onset  . Stroke Other   . Cancer Other   . Heart attack Mother   . Leukemia Sister   . Cancer Brother       Home Medications:   Prior to Admission medications     Medication Sig Start Date End Date Taking? Authorizing Provider  aspirin 81 MG chewable tablet Chew 2 tablets (162 mg total) by mouth every morning. Patient taking differently: Chew 81 mg by mouth every evening.  04/30/12  Yes Rexene Alberts, MD  cetirizine (ZYRTEC) 10 MG tablet Take 10 mg by mouth daily.   Yes [provider]  diphenoxylate-atropine (LOMOTIL) 2.5-0.025 MG tablet Take 1-2 tablets by mouth 4 (four) times daily as needed for diarrhea or loose stools.   Yes [provider]  metoprolol succinate (TOPROL-XL) 25 MG 24 hr tablet Take 1 tablet (25 mg total) by mouth daily. New blood pressure medicine. 04/30/12 05/08/20 Yes Rexene Alberts, MD  XARELTO 15 MG TABS tablet Take 15 mg by mouth daily. 12/14/19  Yes [provider]  hydrocortisone (ANUSOL-HC) 25 MG suppository Place 25 mg rectally daily as needed. Patient not taking: Reported on 05/08/2020 04/19/20   [provider]  vancomycin (VANCOCIN) 50 mg/mL oral solution Take 2.5 mLs (125 mg total) by mouth 4 (four) times daily. Patient not taking: Reported on 05/08/2020 04/15/20   Orson Eva, MD     Allergies:    No Known Allergies   Physical Exam:   Vitals  Blood pressure 131/85, pulse 81, temperature (!) 101.6 F (38.7 C), temperature source Rectal, resp. rate (!) 26, height 5\' 4"  (1.626 m), weight 61.1 kg, SpO2 93 %.  1.  General: Appears in no acute distress  2. Psychiatric: Alert, oriented x3, intact insight and judgment  3. Neurologic: Cranial nerves II through XII grossly intact, no focal deficit noted  4. HEENMT:  Atraumatic normocephalic, extraocular muscles are intact, oral mucosa is pink and moist  5. Respiratory : Clear to auscultation bilaterally, no wheezing or crackles auscultated  6. Cardiovascular : S1-S2, regular, no murmur auscultated  7. Gastrointestinal:  Abdomen is soft, nondistended, positive left lower quadrant tenderness palpation     Data Review:     CBC Recent Labs  Lab 05/08/20 1902  WBC 10.4  HGB 15.1*  HCT 48.1*  PLT 102*  MCV 94.1  MCH 29.5  MCHC 31.4  RDW 15.9*  LYMPHSABS 0.8  MONOABS 0.6  EOSABS 0.0  BASOSABS 0.0   ------------------------------------------------------------------------------------------------------------------  Results for orders placed or performed during the hospital encounter of 05/08/20 (from the past 48 hour(s))  Urinalysis, Routine w reflex microscopic     Status: Abnormal   Collection Time: 05/08/20  6:43 PM  Result Value Ref Range   Color, Urine YELLOW YELLOW   APPearance CLEAR CLEAR   Specific Gravity, Urine 1.019 1.005 - 1.030   pH 5.0 5.0 - 8.0   Glucose, UA NEGATIVE NEGATIVE mg/dL   Hgb urine dipstick MODERATE (A) NEGATIVE   Bilirubin Urine NEGATIVE NEGATIVE   Ketones, ur 5 (A) NEGATIVE mg/dL   Protein, ur NEGATIVE NEGATIVE mg/dL   Nitrite NEGATIVE NEGATIVE   Leukocytes,Ua NEGATIVE NEGATIVE   RBC / HPF 21-50 0 - 5 RBC/hpf   WBC, UA 0-5 0 - 5 WBC/hpf   Bacteria, UA NONE SEEN NONE SEEN   Squamous Epithelial / LPF 0-5 0 - 5   Mucus PRESENT     Comment: Performed at [facility], [address] [place], [place] [address]  CBC with Differential/Platelet     Status: Abnormal   Collection Time: 05/08/20  7:02 PM  Result Value Ref Range   WBC 10.4 4.0 - 10.5 K/uL   RBC 5.11 3.87 - 5.11 MIL/uL   Hemoglobin 15.1 (H) 12.0 - 15.0 g/dL   HCT 48.1 (H) 36 - 46 %   MCV 94.1 80.0 - 100.0 fL   MCH 29.5 26.0 - 34.0 pg   MCHC 31.4 30.0 - 36.0 g/dL   RDW 15.9 (H) 11.5 - 15.5 %   Platelets 102 (L) 150 - 400 K/uL    Comment: Immature Platelet Fraction may be clinically indicated, consider ordering this additional test [id number]    nRBC 0.0 0.0 - 0.2 %   Neutrophils Relative % 87 %   Neutro Abs 9.0 (H) 1.7 - 7.7 K/uL   Lymphocytes Relative 8 %   Lymphs Abs 0.8 0.7 - 4.0 K/uL   Monocytes Relative 5 %   Monocytes Absolute 0.6 0 - 1 K/uL   Eosinophils Relative 0 %   Eosinophils Absolute  0.0 0 - 0 K/uL   Basophils Relative 0 %   Basophils Absolute 0.0 0 - 0 K/uL   Immature Granulocytes 0 %  Abs Immature Granulocytes 0.03 0.00 - 0.07 K/uL    Comment: Performed at [facility], [address] [place], [place] [address]  Comprehensive metabolic panel     Status: Abnormal   Collection Time: 05/08/20  7:02 PM  Result Value Ref Range   Sodium 139 135 - 145 mmol/L   Potassium 4.0 3.5 - 5.1 mmol/L   Chloride 100 98 - 111 mmol/L   CO2 27 22 - 32 mmol/L   Glucose, Bld 125 (H) 70 - 99 mg/dL    Comment: Glucose reference range applies only to samples taken after fasting for at least 8 hours.   BUN 15 8 - 23 mg/dL   Creatinine, Ser 0.67 0.44 - 1.00 mg/dL   Calcium 8.9 8.9 - 10.3 mg/dL   Total Protein 6.7 6.5 - 8.1 g/dL   Albumin 3.9 3.5 - 5.0 g/dL   AST 25 15 - 41 U/L   ALT 18 0 - 44 U/L   Alkaline Phosphatase 71 38 - 126 U/L   Total Bilirubin 1.7 (H) 0.3 - 1.2 mg/dL   GFR calc non Af Amer >60 >60 mL/min   GFR calc Af Amer >60 >60 mL/min   Anion gap 12 5 - 15    Comment: Performed at [facility], [address], [place], [place] [address]  Troponin I (High Sensitivity)     Status: None   Collection Time: 05/08/20  7:02 PM  Result Value Ref Range   Troponin I (High Sensitivity) 4 <18 ng/L    Comment: (NOTE) Elevated high sensitivity troponin I (hsTnI) values and significant  changes across serial measurements may suggest ACS but many other  chronic and acute conditions are known to elevate hsTnI results.  Refer to the "Links" section for chest pain algorithms and additional  guidance. Performed at [facility], [address] [place], [place] [address]   Magnesium     Status: Abnormal   Collection Time: 05/08/20  7:02 PM  Result Value Ref Range   Magnesium 1.6 (L) 1.7 - 2.4 mg/dL    Comment: Performed at [facility], [address], [place], [place] [address]  Lactic acid, plasma     Status: None   Collection Time: 05/08/20  7:04 PM  Result Value Ref Range   Lactic  Acid, Venous 1.4 0.5 - 1.9 mmol/L    Comment: Performed at [facility], [address] [place], [place] [address]  SARS Coronavirus 2 by RT PCR (hospital order, performed in [facility] hospital lab) Nasopharyngeal Nasopharyngeal Swab     Status: None   Collection Time: 05/08/20  9:35 PM   Specimen: Nasopharyngeal Swab  Result Value Ref Range   SARS Coronavirus 2 NEGATIVE NEGATIVE    Comment: (NOTE) SARS-CoV-2 target nucleic acids are NOT DETECTED.  The SARS-CoV-2 RNA is generally detectable in upper and lower respiratory specimens during the acute phase of infection. The lowest concentration of SARS-CoV-2 viral copies this assay can detect is 250 copies / mL. A negative result does not preclude SARS-CoV-2 infection and should not be used as the sole basis for treatment or other patient management decisions.  A negative result may occur with improper specimen collection / handling, submission of specimen other than nasopharyngeal swab, presence of viral mutation(s) within the areas targeted by this assay, and inadequate number of viral copies (<250 copies / mL). A negative result must be combined with clinical observations, patient history, and epidemiological information.  Fact Sheet for Patients:   StrictlyIdeas.no  Fact Sheet  for Healthcare Providers: BankingDealers.co.za  This test is not yet approved or  cleared by the [place] and has been authorized for detection and/or diagnosis of SARS-CoV-2 by FDA under an Emergency Use Authorization (EUA).  This EUA will remain in effect (meaning this test can be used) for the duration of the COVID-19 declaration under Section 564(b)(1) of the Act, 21 U.S.C. section 360bbb-3(b)(1), unless the authorization is terminated or revoked sooner.  Performed at [facility], [address] [place], [place] [address]   Lactic acid, plasma     Status: None   Collection Time: 05/08/20  9:50  PM  Result Value Ref Range   Lactic Acid, Venous 1.2 0.5 - 1.9 mmol/L    Comment: Performed at [facility], [address], [place], [place] [address]  Blood culture (routine x 2)     Status: None (Preliminary result)   Collection Time: 05/08/20 10:01 PM   Specimen: Left Antecubital; Blood  Result Value Ref Range   Specimen Description LEFT ANTECUBITAL    Special Requests      BOTTLES DRAWN AEROBIC AND ANAEROBIC Blood Culture adequate volume Performed at [facility], [address] [place], [place] [address]    Culture PENDING    Report Status PENDING   Blood culture (routine x 2)     Status: None (Preliminary result)   Collection Time: 05/08/20 10:01 PM   Specimen: BLOOD LEFT WRIST  Result Value Ref Range   Specimen Description BLOOD LEFT WRIST    Special Requests      BOTTLES DRAWN AEROBIC AND ANAEROBIC Blood Culture adequate volume Performed at [facility], [address], [place], [place] [address]    Culture PENDING    Report Status PENDING     Chemistries  Recent Labs  Lab 05/08/20 1902  NA 139  K 4.0  CL 100  CO2 27  GLUCOSE 125*  BUN 15  CREATININE 0.67  CALCIUM 8.9  MG 1.6*  AST 25  ALT 18  ALKPHOS 71  BILITOT 1.7*   ------------------------------------------------------------------------------------------------------------------  ------------------------------------------------------------------------------------------------------------------ GFR: Estimated Creatinine Clearance: 35.5 mL/min (by C-G formula based on SCr of 0.67 mg/dL). Liver Function Tests: Recent Labs  Lab 05/08/20 1902  AST 25  ALT 18  ALKPHOS 71  BILITOT 1.7*  PROT 6.7  ALBUMIN 3.9   No results for input(s): LIPASE, AMYLASE in the last 168 hours. No results for input(s): AMMONIA in the last 168 hours. Coagulation Profile: No results for input(s): INR, PROTIME in the last 168 hours. Cardiac Enzymes: No results for input(s): CKTOTAL, CKMB, CKMBINDEX, TROPONINI in the last  168 hours. BNP (last 3 results) No results for input(s): PROBNP in the last 8760 hours. HbA1C: No results for input(s): HGBA1C in the last 72 hours. CBG: No results for input(s): GLUCAP in the last 168 hours. Lipid Profile: No results for input(s): CHOL, HDL, LDLCALC, TRIG, CHOLHDL, LDLDIRECT in the last 72 hours. Thyroid Function Tests: No results for input(s): TSH, T4TOTAL, FREET4, T3FREE, THYROIDAB in the last 72 hours. Anemia Panel: No results for input(s): VITAMINB12, FOLATE, FERRITIN, TIBC, IRON, RETICCTPCT in the last 72 hours.  --------------------------------------------------------------------------------------------------------------- Urine analysis:    Component Value Date/Time   COLORURINE YELLOW 05/08/2020 1843   APPEARANCEUR CLEAR 05/08/2020 1843   LABSPEC 1.019 05/08/2020 1843   PHURINE 5.0 05/08/2020 1843   GLUCOSEU NEGATIVE 05/08/2020 1843   HGBUR MODERATE (A) 05/08/2020 1843   BILIRUBINUR NEGATIVE 05/08/2020 1843   KETONESUR 5 (A) 05/08/2020 1843   PROTEINUR NEGATIVE 05/08/2020 1843  UROBILINOGEN 0.2 04/28/2012 2308   NITRITE NEGATIVE 05/08/2020 1843   LEUKOCYTESUR NEGATIVE 05/08/2020 1843      Imaging Results:    DG ABD ACUTE 2+V W 1V CHEST  Result Date: 05/08/2020 CLINICAL DATA:  Distended abdomen EXAM: DG ABDOMEN ACUTE W/ 1V CHEST COMPARISON:  CT 04/13/2020 FINDINGS: Cardiomegaly. Calcified pleural plaques laterally in the left lower hemithorax. Nonobstructive bowel gas pattern. No free air organomegaly. No suspicious calcification. IMPRESSION: No evidence of bowel obstruction or free air. Cardiomegaly.  Calcified left lower chest pleural plaques. Electronically Signed   By: Rolm Baptise M.D.   On: 05/08/2020 19:07    My personal review of EKG: Rhythm atrial fibrillation   Assessment & Plan:    Active Problems:   Colitis   1. Diarrhea-concerning for recurrence of C. difficile colitis, will obtain CT abdomen/pelvis, check stool for C. difficile  PCR, also start her on vancomycin taper for empiric C. difficile colitis.  If C. difficile PCR is negative, will discontinue vancomycin. 2. Chronic atrial fibrillation-heart rate is controlled, continue metoprolol for rate control, Xarelto for anticoagulation. 3. Hypertension-blood pressure is controlled, continue metoprolol. 4. Hypomagnesemia-we will replace magnesium with 2 g IV magnesium sulfate x1.  Follow serum magnesium level in a.m.    DVT Prophylaxis-Xarelto  AM Labs Ordered, also please review Full Orders  Family Communication: Admission, patients condition and plan of care including tests being ordered have been discussed with the patient and her daughter at bedside who indicate understanding and agree with the plan and Code Status.  Code Status: DNR  Admission status: Inpatient :The appropriate admission status for this patient is INPATIENT. Inpatient status is judged to be reasonable and necessary in order to provide the required intensity of service to ensure the patient's safety. The patient's presenting symptoms, physical exam findings, and initial radiographic and laboratory data in the context of their chronic comorbidities is felt to place them at high risk for further clinical deterioration. Furthermore, it is not anticipated that the patient will be medically stable for discharge from the hospital within 2 midnights of admission. The following factors support the admission status of inpatient.     The patient's presenting symptoms include diarrhea. The worrisome physical exam findings include left lower quadrant tenderness. The initial radiographic and laboratory data are worrisome because of diarrhea, questionable recurrence of C. difficile colitis. The chronic co-morbidities include atrial fibrillation, hypertension.       * I certify that at the point of admission it is my clinical judgment that the patient will require inpatient hospital care spanning beyond 2  midnights from the point of admission due to high intensity of service, high risk for further deterioration and high frequency of surveillance required.*  Time spent in minutes : 60 minutes   Jamielynn Wigley S Abdulhamid Olgin M.D

## 2020-05-08 NOTE — ED Triage Notes (Signed)
PT brought in by [facility] from home with family today. EMS reports family said pt has had decreased po intake and lethargic at times over the past few days. PT is A&O x3 upon arrival and confused to time. PT c/o generalized abdominal pain and states she finished antibiotics yesterday for treatment of Cdiff. EMS reports pt had one small bowel movement early this am. PT was given 548mL of NS by IV in route to the ED today.

## 2020-05-09 ENCOUNTER — Encounter (HOSPITAL_COMMUNITY): Payer: Self-pay | Admitting: Family Medicine

## 2020-05-09 DIAGNOSIS — R1084 Generalized abdominal pain: Secondary | ICD-10-CM

## 2020-05-09 DIAGNOSIS — K529 Noninfective gastroenteritis and colitis, unspecified: Secondary | ICD-10-CM

## 2020-05-09 DIAGNOSIS — E876 Hypokalemia: Secondary | ICD-10-CM

## 2020-05-09 DIAGNOSIS — E785 Hyperlipidemia, unspecified: Secondary | ICD-10-CM

## 2020-05-09 DIAGNOSIS — I482 Chronic atrial fibrillation, unspecified: Secondary | ICD-10-CM

## 2020-05-09 DIAGNOSIS — E860 Dehydration: Secondary | ICD-10-CM

## 2020-05-09 LAB — CBC
HCT: 43.2 % (ref 36.0–46.0)
Hemoglobin: 13.7 g/dL (ref 12.0–15.0)
MCH: 29 pg (ref 26.0–34.0)
MCHC: 31.7 g/dL (ref 30.0–36.0)
MCV: 91.3 fL (ref 80.0–100.0)
Platelets: 89 10*3/uL — ABNORMAL LOW (ref 150–400)
RBC: 4.73 MIL/uL (ref 3.87–5.11)
RDW: 15.9 % — ABNORMAL HIGH (ref 11.5–15.5)
WBC: 7.1 10*3/uL (ref 4.0–10.5)
nRBC: 0 % (ref 0.0–0.2)

## 2020-05-09 LAB — COMPREHENSIVE METABOLIC PANEL
ALT: 16 U/L (ref 0–44)
AST: 20 U/L (ref 15–41)
Albumin: 3.2 g/dL — ABNORMAL LOW (ref 3.5–5.0)
Alkaline Phosphatase: 54 U/L (ref 38–126)
Anion gap: 12 (ref 5–15)
BUN: 15 mg/dL (ref 8–23)
CO2: 26 mmol/L (ref 22–32)
Calcium: 8.3 mg/dL — ABNORMAL LOW (ref 8.9–10.3)
Chloride: 102 mmol/L (ref 98–111)
Creatinine, Ser: 0.52 mg/dL (ref 0.44–1.00)
GFR calc Af Amer: >60 mL/min (ref >60)
GFR calc non Af Amer: >60 mL/min (ref >60)
Glucose, Bld: 138 mg/dL — ABNORMAL HIGH (ref 70–99)
Potassium: 3.1 mmol/L — ABNORMAL LOW (ref 3.5–5.1)
Sodium: 140 mmol/L (ref 135–145)
Total Bilirubin: 1.5 mg/dL — ABNORMAL HIGH (ref 0.3–1.2)
Total Protein: 5.7 g/dL — ABNORMAL LOW (ref 6.5–8.1)

## 2020-05-09 LAB — C DIFFICILE QUICK SCREEN W PCR REFLEX
C Diff antigen: POSITIVE — AB
C Diff interpretation: DETECTED
C Diff toxin: POSITIVE — AB

## 2020-05-09 LAB — MAGNESIUM: Magnesium: 2.1 mg/dL (ref 1.7–2.4)

## 2020-05-09 MED ORDER — VANCOMYCIN 50 MG/ML ORAL SOLUTION: 125.0000 mg | Freq: Every day | ORAL | Status: DC

## 2020-05-09 MED ORDER — VANCOMYCIN 50 MG/ML ORAL SOLUTION: 125.0000 mg | ORAL | Status: DC

## 2020-05-09 MED ORDER — ACETAMINOPHEN 325 MG PO TABS: 650.0000 mg | ORAL_TABLET | Freq: Four times a day (QID) | ORAL | Status: DC | PRN

## 2020-05-09 MED ORDER — POTASSIUM CHLORIDE 10 MEQ/100ML IV SOLN
10.0000 meq | INTRAVENOUS | Status: AC
  Administered 2020-05-09 (×4): 10 meq via INTRAVENOUS
  Filled 2020-05-09 (×4): qty 100

## 2020-05-09 MED ORDER — VANCOMYCIN 50 MG/ML ORAL SOLUTION: 125.0000 mg | Freq: Two times a day (BID) | ORAL | Status: DC

## 2020-05-09 MED ORDER — ONDANSETRON HCL 4 MG PO TABS: 4.0000 mg | ORAL_TABLET | Freq: Four times a day (QID) | ORAL | Status: DC | PRN

## 2020-05-09 MED ORDER — SODIUM CHLORIDE 0.9 % IV SOLN: INTRAVENOUS | Status: DC

## 2020-05-09 MED ORDER — ONDANSETRON HCL 4 MG/2ML IJ SOLN: 4.0000 mg | Freq: Four times a day (QID) | INTRAMUSCULAR | Status: DC | PRN

## 2020-05-09 MED ORDER — VANCOMYCIN 50 MG/ML ORAL SOLUTION
125.0000 mg | Freq: Four times a day (QID) | ORAL | Status: DC
  Administered 2020-05-09 – 2020-05-11 (×9): 125 mg via ORAL
  Filled 2020-05-09 (×14): qty 2.5

## 2020-05-09 MED ORDER — RIVAROXABAN 15 MG PO TABS
15.0000 mg | ORAL_TABLET | Freq: Every day | ORAL | Status: DC
  Administered 2020-05-09 – 2020-05-10 (×2): 15 mg via ORAL
  Filled 2020-05-09 (×2): qty 1

## 2020-05-09 MED ORDER — LORATADINE 10 MG PO TABS
10.0000 mg | ORAL_TABLET | Freq: Every day | ORAL | Status: DC
  Administered 2020-05-09 – 2020-05-11 (×3): 10 mg via ORAL
  Filled 2020-05-09 (×3): qty 1

## 2020-05-09 MED ORDER — SACCHAROMYCES BOULARDII 250 MG PO CAPS
250.0000 mg | ORAL_CAPSULE | Freq: Two times a day (BID) | ORAL | Status: DC
  Administered 2020-05-09 – 2020-05-11 (×4): 250 mg via ORAL
  Filled 2020-05-09 (×4): qty 1

## 2020-05-09 MED ORDER — METOPROLOL SUCCINATE ER 25 MG PO TB24
25.0000 mg | ORAL_TABLET | Freq: Every day | ORAL | Status: DC
  Administered 2020-05-09 – 2020-05-11 (×3): 25 mg via ORAL
  Filled 2020-05-09 (×3): qty 1

## 2020-05-09 MED ORDER — ASPIRIN 81 MG PO CHEW
81.0000 mg | CHEWABLE_TABLET | Freq: Every evening | ORAL | Status: DC
  Administered 2020-05-09 – 2020-05-10 (×2): 81 mg via ORAL
  Filled 2020-05-09 (×3): qty 1

## 2020-05-09 MED ORDER — ACETAMINOPHEN 650 MG RE SUPP: 650.0000 mg | Freq: Four times a day (QID) | RECTAL | Status: DC | PRN

## 2020-05-09 NOTE — Plan of Care (Signed)

## 2020-05-09 NOTE — Consult Note (Signed)
Referring Provider: [facility] Primary Care Physician:  Redmond School, MD Primary Gastroenterologist:  Dr. Laural Golden (Previously unassigned)  Date of Admission: 05/08/20 Date of Consultation: 05/09/20  Reason for Consultation:  Recurrent C. diff  HPI:  Kathryn Marquez is a 86 y.o. year old female chronic atrial fibrillation, on anticoagulation with Eliquis, hypertension, hyperlipidemia recent diagnosis of C. difficile colitis after being treated with Augmentin for diverticulitis as an outpatient. She was admitted to the hospital from June 3 to June 5.  She was discharged on 15 days of vancomycin.  As per patient's daughter, patient's diarrhea had improved she received last dose of vancomycin a week ago.  Yesterday morning patient started having diarrhea again. Family also reported decreased oral intake and becoming more lethargic/? Episodes of confusion. She was brought to the hospital and found to be febrile with temp 99.8.  She had multiple episodes of loose bowel movements in the hospital. Patient also complains of left lower quadrant abdominal pain.  Labs in the ED: WBC 10.4, hemoglobin 15.1, platelets 102. Kidney function, electrolytes, and LFTs within normal limits. Bilirubin mildly elevated at 1.7. Lactic acid 1.4. C. Diff antigen and toxin positive.   CT A/P with contrast with progressive colonic wall thickening and pericolonic edema from prior exam. Colitis now extends from the hepatic flexure to the rectum, previously isolated to the sigmoid colon. There is no perforation or evidence of toxic megacolon.  She was given IV fluids and tylenol in the ED. Also received 500 mL IV fluids via EMS. Hospitalists started Vancomycin with prolonged taper.   Today:  Thinks diarrhea improved on antibiotics. Diarrhea returned a couple days ago. Sudden watery diarrhea. No blood in the stool or black stools. No abdominal pain. Thinks she had some abdominal pain yesterday morning in the very low part  of her abdomen. No N/V. Has phlegm in her throat that she has to cough up. Passing some gas. Doesn't thinks she ate dinner. Hasn't had breakfast yet. She is thirsty and ready to try clear liquids.   States she has trouble thinking and has mild confusion. Daughter lives with her. Walks with a walker. Daughter assists/monitors as needed.   Past Medical History:  Diagnosis Date  . Acute embolic stroke ([place]) 8/41/3244  . Dyslipidemia, goal LDL below 100 04/30/2012  . Dysrhythmia    Chronic atrial fib  . Hypertension     History reviewed. No pertinent surgical history.  Prior to Admission medications   Medication Sig Start Date End Date Taking? Authorizing Provider  aspirin 81 MG chewable tablet Chew 2 tablets (162 mg total) by mouth every morning. Patient taking differently: Chew 81 mg by mouth every evening.  04/30/12  Yes Rexene Alberts, MD  cetirizine (ZYRTEC) 10 MG tablet Take 10 mg by mouth daily.   Yes [provider]  diphenoxylate-atropine (LOMOTIL) 2.5-0.025 MG tablet Take 1-2 tablets by mouth 4 (four) times daily as needed for diarrhea or loose stools.   Yes [provider]  metoprolol succinate (TOPROL-XL) 25 MG 24 hr tablet Take 1 tablet (25 mg total) by mouth daily. New blood pressure medicine. 04/30/12 05/08/20 Yes Rexene Alberts, MD  XARELTO 15 MG TABS tablet Take 15 mg by mouth daily. 12/14/19  Yes [provider]  hydrocortisone (ANUSOL-HC) 25 MG suppository Place 25 mg rectally daily as needed. Patient not taking: Reported on 05/08/2020 04/19/20   [provider]  vancomycin (VANCOCIN) 50 mg/mL oral solution Take 2.5 mLs (125 mg total) by mouth 4 (four) times  daily. Patient not taking: Reported on 05/08/2020 04/15/20   Orson Eva, MD    Current Facility-Administered Medications  Medication Dose Route Frequency Provider Last Rate Last Admin  . 0.9 %  sodium chloride infusion   Intravenous Continuous Wynetta Emery, Clanford L, MD 100 mL/hr at 05/09/20 0148  New Bag at 05/09/20 0148  . acetaminophen (TYLENOL) tablet 650 mg  650 mg Oral Q6H PRN Oswald Hillock, MD       Or  . acetaminophen (TYLENOL) suppository 650 mg  650 mg Rectal Q6H PRN Oswald Hillock, MD      . aspirin chewable tablet 81 mg  81 mg Oral QPM Darrick Meigs, Marge Duncans, MD      . loratadine (CLARITIN) tablet 10 mg  10 mg Oral Daily Oswald Hillock, MD      . metoprolol succinate (TOPROL-XL) 24 hr tablet 25 mg  25 mg Oral Daily Darrick Meigs, Marge Duncans, MD      . nystatin (MYCOSTATIN/NYSTOP) topical powder   Topical TID Oswald Hillock, MD      . ondansetron [facility]) tablet 4 mg  4 mg Oral Q6H PRN Oswald Hillock, MD       Or  . ondansetron (ZOFRAN) injection 4 mg  4 mg Intravenous Q6H PRN Oswald Hillock, MD      . potassium chloride 10 mEq in 100 mL IVPB  10 mEq Intravenous Q1 Hr x 4 Johnson, Clanford L, MD      . Rivaroxaban (XARELTO) tablet 15 mg  15 mg Oral Q supper Oswald Hillock, MD      . vancomycin (VANCOCIN) 50 mg/mL oral solution 125 mg  125 mg Oral QID Oswald Hillock, MD       Followed by  . [START ON 05/23/2020] vancomycin (VANCOCIN) 50 mg/mL oral solution 125 mg  125 mg Oral BID Oswald Hillock, MD       Followed by  . [START ON 05/30/2020] vancomycin (VANCOCIN) 50 mg/mL oral solution 125 mg  125 mg Oral Daily Oswald Hillock, MD       Followed by  . [START ON 06/06/2020] vancomycin (VANCOCIN) 50 mg/mL oral solution 125 mg  125 mg Oral Nilda Simmer, MD       Followed by  . [START ON 06/14/2020] vancomycin (VANCOCIN) 50 mg/mL oral solution 125 mg  125 mg Oral Q3 days Oswald Hillock, MD        Allergies as of 05/08/2020  . (No Known Allergies)    Family History  Problem Relation Age of Onset  . Stroke Other   . Cancer Other   . Heart attack Mother   . Leukemia Sister   . Cancer Brother     Social History   Socioeconomic History  . Marital status: Widowed    Spouse name: Not on file  . Number of children: Not on file  . Years of education: Not on file  . Highest education level: Not on  file  Occupational History  . Not on file  Tobacco Use  . Smoking status: Never Smoker  . Smokeless tobacco: Never Used  Vaping Use  . Vaping Use: Never used  Substance and Sexual Activity  . Alcohol use: No  . Drug use: No  . Sexual activity: Not Currently  Other Topics Concern  . Not on file  Social History Narrative  . Not on file   Social Determinants of Health   Financial Resource Strain:   . Difficulty  of Paying Living Expenses:   Food Insecurity:   . Worried About Charity fundraiser in the Last Year:   . Arboriculturist in the Last Year:   Transportation Needs:   . Film/video editor (Medical):   Marland Kitchen Lack of Transportation (Non-Medical):   Physical Activity:   . Days of Exercise per Week:   . Minutes of Exercise per Session:   Stress:   . Feeling of Stress :   Social Connections:   . Frequency of Communication with Friends and Family:   . Frequency of Social Gatherings with Friends and Family:   . Attends Religious Services:   . Active Member of Clubs or Organizations:   . Attends Archivist Meetings:   Marland Kitchen Marital Status:   Intimate Partner Violence:   . Fear of Current or Ex-Partner:   . Emotionally Abused:   Marland Kitchen Physically Abused:   . Sexually Abused:     Review of Systems: Somewhat limited due to mild confusion/memory trouble.  Gen: Lightheadedness, dizziness, pres-syncope, or syncope.  CV: Denies chest pain or palpitations Resp: Denies shortness of breath. Occasional cough productive of phlegm.  GI: See HPI GU : Denies urinary burning, urinary frequency, urinary incontinence.  MS: Denies joint pain Derm: Denies rash Psych: Denies depression or anxiety Heme: Denies bruising or bleeding  Physical Exam: Vital signs in last 24 hours: Temp:  [99.1 F (37.3 C)-101.6 F (38.7 C)] 99.1 F (37.3 C) (06/29 0431) Pulse Rate:  [79-111] 99 (06/29 0431) Resp:  [16-26] 20 (06/29 0431) BP: (113-143)/(70-85) 125/75 (06/29 0431) SpO2:  [91 %-97 %]  97 % (06/29 0431) Weight:  [61.1 kg] 61.1 kg (06/28 1720) Last BM Date: 05/09/20 General:   Alert, frail, elderly lady, pleasant and cooperative in NAD Head:  Normocephalic and atraumatic. Eyes:  Sclera clear, no icterus.   Conjunctiva pink. Ears:  Normal auditory acuity. Mouth:  No deformity or lesions, mucous membranes dry. Lungs:  Clear throughout to auscultation.   No wheezes, crackles, or rhonchi. No acute distress. Heart:  Irregularly irregular rate and rhythm; no murmurs, clicks, rubs,  or gallops. Abdomen:  Soft, mild distension, mild TTP in RUQ. No other significant TTP. No masses, hepatosplenomegaly or hernias noted. Normal bowel sounds, without guarding, and without rebound.   Rectal:  Deferred.  Msk:  Symmetrical without gross deformities. Normal posture. Extremities:  Without edema. Neurologic:  Alert and  Oriented to person, place, and situation. Unable to tell me the year. Grossly normal neurologically. Skin:  Intact without significant lesions or rashes. Psych:  Normal mood and affect.  Intake/Output from previous day: 06/28 0701 - 06/29 0700 In: 863.2 [I.V.:818.4; IV Piggyback:44.8] Out: -  Intake/Output this shift: No intake/output data recorded.  Lab Results: Recent Labs    05/08/20 1902 05/09/20 0540  WBC 10.4 7.1  HGB 15.1* 13.7  HCT 48.1* 43.2  PLT 102* 89*   BMET Recent Labs    05/08/20 1902 05/09/20 0540  NA 139 140  K 4.0 3.1*  CL 100 102  CO2 27 26  GLUCOSE 125* 138*  BUN 15 15  CREATININE 0.67 0.52  CALCIUM 8.9 8.3*   LFT Recent Labs    05/08/20 1902 05/09/20 0540  PROT 6.7 5.7*  ALBUMIN 3.9 3.2*  AST 25 20  ALT 18 16  ALKPHOS 71 54  BILITOT 1.7* 1.5*   PT/INR No results for input(s): LABPROT, INR in the last 72 hours. Hepatitis Panel No results for input(s): HEPBSAG, [place], [place],  HEPBIGM in the last 72 hours. C-Diff Recent Labs    05/08/20 2334  CDIFFTOX POSITIVE*    Studies/Results: CT ABDOMEN PELVIS W  CONTRAST  Result Date: 05/09/2020 CLINICAL DATA:  86 year old with decreased p.o. intake and lethargy. Generalized abdominal pain. Finished antibiotics yesterday for treatment of C diff. Diverticulitis suspected EXAM: CT ABDOMEN AND PELVIS WITH CONTRAST TECHNIQUE: Multidetector CT imaging of the abdomen and pelvis was performed using the standard protocol following bolus administration of intravenous contrast. CONTRAST:  48mL OMNIPAQUE IOHEXOL 300 MG/ML  SOLN COMPARISON:  CT 04/14/2019 FINDINGS: Lower chest: Cardiomegaly with predominantly right heart biatrial dilatation. Small pericardial effusion. There are small bilateral pleural effusions and compressive atelectasis, similar to prior exam. Hepatobiliary: No focal hepatic abnormality. The gallbladder is distended without pericholecystic inflammation or calcified gallstone. There is no biliary dilatation. Pancreas: Mild parenchymal atrophy. No ductal dilatation or inflammation. Spleen: Normal in size without focal abnormality. Adrenals/Urinary Tract: No adrenal nodule. No hydronephrosis. Multiple bilateral renal cysts. Low-density cystic structure adjacent to the lower pole of the right kidney measuring 8.9 cm may be exophytic or adjacent, unchanged and nonenhancing. Urinary bladder is unremarkable. Stomach/Bowel: Progressive colonic wall thickening and pericolonic edema from prior exam. Wall thickening now extends at least from the hepatic flexure to the rectum. Greatest degree of inflammatory changes adjacent to the rectum which is distended with stool. Rectal distension is 5.8 cm. There is formed stool in the colon. No evidence of abnormal colonic distension, pneumatosis, or obstruction. No significant colonic diverticulosis. Appendix not well visualized on the current exam. There is no small bowel obstruction or inflammation. Moderate-sized duodenal diverticulum without inflammatory change. Vascular/Lymphatic: Aortic atherosclerosis. No aortic aneurysm.  Patent portal vein. Reproductive: Quiescent uterus and ovaries. Other: Stable lobulated 3.3 cm cystic density in the right inguinal canal. No inflammatory changes. Trace free fluid in the pelvis. There is presacral edema. No free air or intra-abdominal abscess. Musculoskeletal: Bones under mineralized without acute osseous abnormality. IMPRESSION: 1. Progressive colonic wall thickening and pericolonic edema from prior exam. Colitis now extends from the hepatic flexure to the rectum, previously isolated to the sigmoid colon. There is no perforation or evidence of toxic megacolon. 2. Small bilateral pleural effusions and compressive atelectasis, similar to prior exam. 3. Multiple bilateral renal cysts. Low-density cystic structure adjacent to the lower pole of the right kidney measuring 8.9 cm may be exophytic or adjacent, unchanged from prior exam. No suspicious characteristics. 4. Cystic lesion in the right inguinal canal is stable, of doubtful clinical significance. 5. Additional stable chronic findings as described. Aortic Atherosclerosis (ICD10-I70.0). Electronically Signed   By: Keith Rake M.D.   On: 05/09/2020 00:30   DG ABD ACUTE 2+V W 1V CHEST  Result Date: 05/08/2020 CLINICAL DATA:  Distended abdomen EXAM: DG ABDOMEN ACUTE W/ 1V CHEST COMPARISON:  CT 04/13/2020 FINDINGS: Cardiomegaly. Calcified pleural plaques laterally in the left lower hemithorax. Nonobstructive bowel gas pattern. No free air organomegaly. No suspicious calcification. IMPRESSION: No evidence of bowel obstruction or free air. Cardiomegaly.  Calcified left lower chest pleural plaques. Electronically Signed   By: Rolm Baptise M.D.   On: 05/08/2020 19:07    Impression: 86 y.o. year old female chronic atrial fibrillation, on anticoagulation with Eliquis, hypertension, hyperlipidemia recent diagnosis of C. difficile colitis with hospitalization in June 2021 after being treated with Augmentin for diverticulitis as an outpatient. She  was treated with vancomycin with some improvement in diarrhea initially, now with recurrent C. Diff.   C. Diff colitis: Recurrent.  First diagnosed  04/14/2020.  Treated with vancomycin orally 125 mg 4 times daily with improvement in diarrhea after completion of antibiotics.  Now with return of watery diarrhea, fever with T-max of 101.6 in the last 24 hours, worsening colitis noted on CT (colitis extending from the hepatic flexure to the rectum, previously isolated to the sigmoid colon) but no evidence of perforation or toxic megacolon. Positive C. difficile antigen and toxin this admission.  She has remained normotensive and WBC within normal limits.  Clinically, patient feeling improved this morning with no abdominal pain.  Temp 99.1 this morning. Two watery BMs this morning.  Ready to try clear liquids.  Plan: 1. Continue with prolonged vancomycin taper (vancomycin 125 mg 4 times daily x 14 days, then 125 mg orally twice daily for 7 days, then 125 mg orally once daily for 7 days, then 125 mg orally every 2 or 3 days for 2 weeks). 2.  Continue clear liquids for now to see if patient tolerates this. 3.  Continue supportive measures. 4.  We will continue to follow closely with you.   LOS: 1 day    05/09/2020, 7:46 AM   Aliene Altes, PA-C [facility]

## 2020-05-09 NOTE — Progress Notes (Signed)
PROGRESS NOTE   Kathryn Marquez  KLK:[id number] DOB: February 05, 1921 DOA: 05/08/2020 PCP: Redmond School, MD   Chief Complaint  Patient presents with  . Abdominal Pain   Brief Admission History:  86 y.o. female, with medical history of chronic atrial fibrillation, on anticoagulation with Eliquis, hypertension, hyperlipidemia recent diagnosis of C. difficile colitis and she was admitted to the hospital from June 3 to June 5.  She was discharged on 15 days of vancomycin.  As per patient's daughter, patient's diarrhea had improved she received last dose of vancomycin a week ago.  This morning patient started having diarrhea again.  She was brought to the hospital and found to be febrile.  She had multiple episodes of loose bowel movements in the hospital.  Patient also complains of left lower quadrant abdominal pain.  Denies chest pain or shortness of breath.  Denies vomiting, but complains of nausea.  Denies dysuria  Assessment & Plan:   Principal Problem:   C. difficile colitis Active Problems:   Hypertension   Chronic atrial fibrillation (HCC)   Dyslipidemia, goal LDL below 100   Hypokalemia   Abdominal pain   Colitis   Dehydration  1. Recurrent C diff colitis - Failed initial treatment.  Pt initially felt better and responded to treatments but symptoms rapidly returned after she completed the vancomycin.  The cost of the treatment is going to be an issue for the patient. I have consulted TOC team for assistance with medication.  Pt will require a prolonged course of vancomycin.  Appreciate the GI service consult and management recommendations.  2. Chronic atrial fibrillation - stable, continue home metoprolol and xarelto.  3. Hypomagnesemia - replete and follow.  4. Essential hypertension -stable, controlled.   DVT prophylaxis: xarelto  Code Status: DNR  Family Communication: daughter at bedside  Disposition:   Status is: Inpatient  Remains inpatient appropriate because:Persistent  severe electrolyte disturbances and Inpatient level of care appropriate due to severity of illness  Dispo: The patient is from: Home              Anticipated d/c is to: Home              Anticipated d/c date is: 3 days              Patient currently is not medically stable to d/c.  Consultants:   GI   Procedures:     Antimicrobials:  Vancomycin 6/29>>   Subjective: Pt reports she feels weak, tired and having abdominal pain, poor appetite   Objective: Vitals:   05/09/20 0103 05/09/20 0431 05/09/20 0835 05/09/20 1237  BP: 126/70 125/75 (!) 141/87 102/90  Pulse: 79 99 69 87  Resp: 20 20 18 16   Temp: 99.5 F (37.5 C) 99.1 F (37.3 C) 98.9 F (37.2 C) 99.1 F (37.3 C)  TempSrc: Oral Oral Oral Oral  SpO2: 95% 97% 97% 96%  Weight:      Height:        Intake/Output Summary (Last 24 hours) at 05/09/2020 1625 Last data filed at 05/09/2020 1600 Gross per 24 hour  Intake 2068.1 ml  Output --  Net 2068.1 ml   Filed Weights   05/08/20 1720  Weight: 61.1 kg    Examination:  General exam: elderly frail female, appears younger than stated age, Appears calm and comfortable  Respiratory system: Clear to auscultation. Respiratory effort normal. Cardiovascular system: S1 & S2 heard, RRR. No JVD, murmurs, rubs, gallops or clicks. No pedal edema. Gastrointestinal system:  Abdomen is mildly distended, soft and general nonspecific tenderness. No organomegaly or masses felt. Normal bowel sounds heard. Central nervous system: Alert and oriented. No focal neurological deficits. Extremities: Symmetric 5 x 5 power. Skin: No rashes, lesions or ulcers Psychiatry: Judgement and insight appear normal. Mood & affect appropriate.   Data Reviewed: I have personally reviewed following labs and imaging studies  CBC: Recent Labs  Lab 05/08/20 1902 05/09/20 0540  WBC 10.4 7.1  NEUTROABS 9.0*  --   HGB 15.1* 13.7  HCT 48.1* 43.2  MCV 94.1 91.3  PLT 102* 89*    Basic Metabolic  Panel: Recent Labs  Lab 05/08/20 1902 05/09/20 0540  NA 139 140  K 4.0 3.1*  CL 100 102  CO2 27 26  GLUCOSE 125* 138*  BUN 15 15  CREATININE 0.67 0.52  CALCIUM 8.9 8.3*  MG 1.6* 2.1    GFR: Estimated Creatinine Clearance: 35.5 mL/min (by C-G formula based on SCr of 0.52 mg/dL).  Liver Function Tests: Recent Labs  Lab 05/08/20 1902 05/09/20 0540  AST 25 20  ALT 18 16  ALKPHOS 71 54  BILITOT 1.7* 1.5*  PROT 6.7 5.7*  ALBUMIN 3.9 3.2*    CBG: No results for input(s): GLUCAP in the last 168 hours.  Recent Results (from the past 240 hour(s))  SARS Coronavirus 2 by RT PCR (hospital order, performed in [facility] hospital lab) Nasopharyngeal Nasopharyngeal Swab     Status: None   Collection Time: 05/08/20  9:35 PM   Specimen: Nasopharyngeal Swab  Result Value Ref Range Status   SARS Coronavirus 2 NEGATIVE NEGATIVE Final    Comment: (NOTE) SARS-CoV-2 target nucleic acids are NOT DETECTED.  The SARS-CoV-2 RNA is generally detectable in upper and lower respiratory specimens during the acute phase of infection. The lowest concentration of SARS-CoV-2 viral copies this assay can detect is 250 copies / mL. A negative result does not preclude SARS-CoV-2 infection and should not be used as the sole basis for treatment or other patient management decisions.  A negative result may occur with improper specimen collection / handling, submission of specimen other than nasopharyngeal swab, presence of viral mutation(s) within the areas targeted by this assay, and inadequate number of viral copies (<250 copies / mL). A negative result must be combined with clinical observations, patient history, and epidemiological information.  Fact Sheet for Patients:   StrictlyIdeas.no  Fact Sheet for Healthcare Providers: BankingDealers.co.za  This test is not yet approved or  cleared by the [place] FDA and has been authorized for  detection and/or diagnosis of SARS-CoV-2 by FDA under an Emergency Use Authorization (EUA).  This EUA will remain in effect (meaning this test can be used) for the duration of the COVID-19 declaration under Section 564(b)(1) of the Act, 21 U.S.C. section 360bbb-3(b)(1), unless the authorization is terminated or revoked sooner.  Performed at [facility], [address], [place], [place] [address]   Blood culture (routine x 2)     Status: None (Preliminary result)   Collection Time: 05/08/20 10:01 PM   Specimen: Left Antecubital; Blood  Result Value Ref Range Status   Specimen Description LEFT ANTECUBITAL  Final   Special Requests   Final    BOTTLES DRAWN AEROBIC AND ANAEROBIC Blood Culture adequate volume   Culture   Final    NO GROWTH < 12 HOURS Performed at [facility], [address], [place], [place] [address]    Report Status PENDING  Incomplete  Blood culture (routine x  2)     Status: None (Preliminary result)   Collection Time: 05/08/20 10:01 PM   Specimen: BLOOD LEFT WRIST  Result Value Ref Range Status   Specimen Description BLOOD LEFT WRIST  Final   Special Requests   Final    BOTTLES DRAWN AEROBIC AND ANAEROBIC Blood Culture adequate volume   Culture   Final    NO GROWTH < 12 HOURS Performed at [facility], [address], [place], [place] [address]    Report Status PENDING  Incomplete  C Difficile Quick Screen w PCR reflex     Status: Abnormal   Collection Time: 05/08/20 11:34 PM   Specimen: STOOL  Result Value Ref Range Status   C Diff antigen POSITIVE (A) NEGATIVE Final   C Diff toxin POSITIVE (A) NEGATIVE Final   C Diff interpretation Toxin producing C. difficile detected.  Final    Comment: CRITICAL RESULT CALLED TO, READ BACK BY AND VERIFIED WITH: B Saragrace Selke,RN@[id number]  05/09/20 [facility] Performed at [facility], [address] [place],  [address]      Radiology Studies: CT ABDOMEN PELVIS W CONTRAST  Result Date: 05/09/2020 CLINICAL DATA:  86 year old  with decreased p.o. intake and lethargy. Generalized abdominal pain. Finished antibiotics yesterday for treatment of C diff. Diverticulitis suspected EXAM: CT ABDOMEN AND PELVIS WITH CONTRAST TECHNIQUE: Multidetector CT imaging of the abdomen and pelvis was performed using the standard protocol following bolus administration of intravenous contrast. CONTRAST:  68mL OMNIPAQUE IOHEXOL 300 MG/ML  SOLN COMPARISON:  CT 04/14/2019 FINDINGS: Lower chest: Cardiomegaly with predominantly right heart biatrial dilatation. Small pericardial effusion. There are small bilateral pleural effusions and compressive atelectasis, similar to prior exam. Hepatobiliary: No focal hepatic abnormality. The gallbladder is distended without pericholecystic inflammation or calcified gallstone. There is no biliary dilatation. Pancreas: Mild parenchymal atrophy. No ductal dilatation or inflammation. Spleen: Normal in size without focal abnormality. Adrenals/Urinary Tract: No adrenal nodule. No hydronephrosis. Multiple bilateral renal cysts. Low-density cystic structure adjacent to the lower pole of the right kidney measuring 8.9 cm may be exophytic or adjacent, unchanged and nonenhancing. Urinary bladder is unremarkable. Stomach/Bowel: Progressive colonic wall thickening and pericolonic edema from prior exam. Wall thickening now extends at least from the hepatic flexure to the rectum. Greatest degree of inflammatory changes adjacent to the rectum which is distended with stool. Rectal distension is 5.8 cm. There is formed stool in the colon. No evidence of abnormal colonic distension, pneumatosis, or obstruction. No significant colonic diverticulosis. Appendix not well visualized on the current exam. There is no small bowel obstruction or inflammation. Moderate-sized duodenal diverticulum without inflammatory change. Vascular/Lymphatic: Aortic atherosclerosis. No aortic aneurysm. Patent portal vein. Reproductive: Quiescent uterus and ovaries.  Other: Stable lobulated 3.3 cm cystic density in the right inguinal canal. No inflammatory changes. Trace free fluid in the pelvis. There is presacral edema. No free air or intra-abdominal abscess. Musculoskeletal: Bones under mineralized without acute osseous abnormality. IMPRESSION: 1. Progressive colonic wall thickening and pericolonic edema from prior exam. Colitis now extends from the hepatic flexure to the rectum, previously isolated to the sigmoid colon. There is no perforation or evidence of toxic megacolon. 2. Small bilateral pleural effusions and compressive atelectasis, similar to prior exam. 3. Multiple bilateral renal cysts. Low-density cystic structure adjacent to the lower pole of the right kidney measuring 8.9 cm may be exophytic or adjacent, unchanged from prior exam. No suspicious characteristics. 4. Cystic lesion in the right inguinal canal is stable, of doubtful clinical significance. 5. Additional stable chronic findings as  described. Aortic Atherosclerosis (ICD10-I70.0). Electronically Signed   By: Keith Rake M.D.   On: 05/09/2020 00:30   DG ABD ACUTE 2+V W 1V CHEST  Result Date: 05/08/2020 CLINICAL DATA:  Distended abdomen EXAM: DG ABDOMEN ACUTE W/ 1V CHEST COMPARISON:  CT 04/13/2020 FINDINGS: Cardiomegaly. Calcified pleural plaques laterally in the left lower hemithorax. Nonobstructive bowel gas pattern. No free air organomegaly. No suspicious calcification. IMPRESSION: No evidence of bowel obstruction or free air. Cardiomegaly.  Calcified left lower chest pleural plaques. Electronically Signed   By: Rolm Baptise M.D.   On: 05/08/2020 19:07   Scheduled Meds: . aspirin  81 mg Oral QPM  . loratadine  10 mg Oral Daily  . metoprolol succinate  25 mg Oral Daily  . nystatin   Topical TID  . Rivaroxaban  15 mg Oral Q supper  . vancomycin  125 mg Oral QID   Followed by  . [START ON 05/23/2020] vancomycin  125 mg Oral BID   Followed by  . [START ON 05/30/2020] vancomycin  125 mg  Oral Daily   Followed by  . [START ON 06/06/2020] vancomycin  125 mg Oral QODAY   Followed by  . [START ON 06/14/2020] vancomycin  125 mg Oral Q3 days   Continuous Infusions: . sodium chloride 60 mL/hr at 05/09/20 0845    LOS: 1 day   Time spent: 20 mins  Janziel Hockett Wynetta Emery, MD How to contact the [facility] Attending or Consulting provider [place] or covering provider during after hours [place], for this patient?  1. Check the care team in [facility] and look for a) attending/consulting TRH provider listed and b) the [facility] team listed 2. Log into [email or web address] and use [place]'s universal password to access. If you do not have the password, please contact the hospital operator. 3. Locate the [facility] provider you are looking for under [facility] and page to a number that you can be directly reached. 4. If you still have difficulty reaching the provider, please page the [facility] (Director on Call) for the Hospitalists listed on amion for assistance.  05/09/2020, 4:25 PM

## 2020-05-10 DIAGNOSIS — I4891 Unspecified atrial fibrillation: Secondary | ICD-10-CM | POA: Diagnosis not present

## 2020-05-10 DIAGNOSIS — A0472 Enterocolitis due to Clostridium difficile, not specified as recurrent: Secondary | ICD-10-CM

## 2020-05-10 DIAGNOSIS — I10 Essential (primary) hypertension: Secondary | ICD-10-CM | POA: Diagnosis not present

## 2020-05-10 DIAGNOSIS — E7849 Other hyperlipidemia: Secondary | ICD-10-CM | POA: Diagnosis not present

## 2020-05-10 LAB — BASIC METABOLIC PANEL
Anion gap: 5 (ref 5–15)
BUN: 14 mg/dL (ref 8–23)
CO2: 30 mmol/L (ref 22–32)
Calcium: 8.4 mg/dL — ABNORMAL LOW (ref 8.9–10.3)
Chloride: 103 mmol/L (ref 98–111)
Creatinine, Ser: 0.58 mg/dL (ref 0.44–1.00)
GFR calc Af Amer: >60 mL/min (ref >60)
GFR calc non Af Amer: >60 mL/min (ref >60)
Glucose, Bld: 103 mg/dL — ABNORMAL HIGH (ref 70–99)
Potassium: 3.6 mmol/L (ref 3.5–5.1)
Sodium: 138 mmol/L (ref 135–145)

## 2020-05-10 LAB — CBC
HCT: 40.2 % (ref 36.0–46.0)
Hemoglobin: 12.9 g/dL (ref 12.0–15.0)
MCH: 29.3 pg (ref 26.0–34.0)
MCHC: 32.1 g/dL (ref 30.0–36.0)
MCV: 91.4 fL (ref 80.0–100.0)
Platelets: 85 10*3/uL — ABNORMAL LOW (ref 150–400)
RBC: 4.4 MIL/uL (ref 3.87–5.11)
RDW: 15.9 % — ABNORMAL HIGH (ref 11.5–15.5)
WBC: 5.3 10*3/uL (ref 4.0–10.5)
nRBC: 0 % (ref 0.0–0.2)

## 2020-05-10 LAB — URINE CULTURE: Culture: NO GROWTH

## 2020-05-10 LAB — MAGNESIUM: Magnesium: 2 mg/dL (ref 1.7–2.4)

## 2020-05-10 MED ORDER — POTASSIUM CHLORIDE CRYS ER 20 MEQ PO TBCR
40.0000 meq | EXTENDED_RELEASE_TABLET | Freq: Once | ORAL | Status: AC
  Administered 2020-05-10: 40 meq via ORAL
  Filled 2020-05-10: qty 2

## 2020-05-10 NOTE — TOC Progression Note (Signed)
Transition of Care [facility]) - Progression Note    Patient Details  Name: Kathryn Marquez MRN: [id number] Date of Birth: [birth year]  Transition of Care [facility]) CM/SW Contact  Salome Arnt, [place] Phone Number: 05/10/2020, 3:03 PM  Clinical Narrative:  TOC received consult for medication assistance. LCSW spoke with pt's son who reports pt will need prolonged course of vancomycin at d/c. Son states that he was told liquid was preferred and that is $500 for 15 days and capsules are $90. Family is unable to afford liquid. MD notified. Will continue to follow and address d/c planning needs.          Expected Discharge Plan and Services                                                 Social Determinants of Health (SDOH) Interventions    Readmission Risk Interventions No flowsheet data found.

## 2020-05-10 NOTE — Progress Notes (Signed)
Subjective: Stool is decreasing in frequency. 2 overnight. Less watery. Better consistency. No abdominal pain right now. Yesterday evening left sided abdomen felt uncomfortable but not "bad". No N/V. Not much of an appetite and didn't like breakfast tray. Interested in advancing diet.   Objective: Vital signs in last 24 hours: Temp:  [98.3 F (36.8 C)-99.1 F (37.3 C)] 98.3 F (36.8 C) (06/30 0453) Pulse Rate:  [77-96] 77 (06/30 0453) Resp:  [16-20] 20 (06/30 0453) BP: (102-145)/(68-90) 127/81 (06/30 0453) SpO2:  [96 %-97 %] 97 % (06/30 0453) Last BM Date: 05/09/20 General:   Alert and oriented, pleasant Head:  Normocephalic and atraumatic. Eyes:  No icterus, sclera clear. Conjuctiva pink.  Abdomen:  Bowel sounds present, soft, non-tender, non-distended. No HSM or hernias noted.  Extremities:  Without  edema. Neurologic:  Alert and  oriented x4  Intake/Output from previous day: 06/29 0701 - 06/30 0700 In: 1564.9 [P.O.:600; I.V.:564.9; IV Piggyback:400] Out: -  Intake/Output this shift: No intake/output data recorded.  Lab Results: Recent Labs    05/08/20 1902 05/09/20 0540 05/10/20 0541  WBC 10.4 7.1 5.3  HGB 15.1* 13.7 12.9  HCT 48.1* 43.2 40.2  PLT 102* 89* 85*   BMET Recent Labs    05/08/20 1902 05/09/20 0540 05/10/20 0541  NA 139 140 138  K 4.0 3.1* 3.6  CL 100 102 103  CO2 27 26 30   GLUCOSE 125* 138* 103*  BUN 15 15 14   CREATININE 0.67 0.52 0.58  CALCIUM 8.9 8.3* 8.4*   LFT Recent Labs    05/08/20 1902 05/09/20 0540  PROT 6.7 5.7*  ALBUMIN 3.9 3.2*  AST 25 20  ALT 18 16  ALKPHOS 71 54  BILITOT 1.7* 1.5*     Studies/Results: CT ABDOMEN PELVIS W CONTRAST  Result Date: 05/09/2020 CLINICAL DATA:  86 year old with decreased p.o. intake and lethargy. Generalized abdominal pain. Finished antibiotics yesterday for treatment of C diff. Diverticulitis suspected EXAM: CT ABDOMEN AND PELVIS WITH CONTRAST TECHNIQUE: Multidetector CT imaging of the  abdomen and pelvis was performed using the standard protocol following bolus administration of intravenous contrast. CONTRAST:  42mL OMNIPAQUE IOHEXOL 300 MG/ML  SOLN COMPARISON:  CT 04/14/2019 FINDINGS: Lower chest: Cardiomegaly with predominantly right heart biatrial dilatation. Small pericardial effusion. There are small bilateral pleural effusions and compressive atelectasis, similar to prior exam. Hepatobiliary: No focal hepatic abnormality. The gallbladder is distended without pericholecystic inflammation or calcified gallstone. There is no biliary dilatation. Pancreas: Mild parenchymal atrophy. No ductal dilatation or inflammation. Spleen: Normal in size without focal abnormality. Adrenals/Urinary Tract: No adrenal nodule. No hydronephrosis. Multiple bilateral renal cysts. Low-density cystic structure adjacent to the lower pole of the right kidney measuring 8.9 cm may be exophytic or adjacent, unchanged and nonenhancing. Urinary bladder is unremarkable. Stomach/Bowel: Progressive colonic wall thickening and pericolonic edema from prior exam. Wall thickening now extends at least from the hepatic flexure to the rectum. Greatest degree of inflammatory changes adjacent to the rectum which is distended with stool. Rectal distension is 5.8 cm. There is formed stool in the colon. No evidence of abnormal colonic distension, pneumatosis, or obstruction. No significant colonic diverticulosis. Appendix not well visualized on the current exam. There is no small bowel obstruction or inflammation. Moderate-sized duodenal diverticulum without inflammatory change. Vascular/Lymphatic: Aortic atherosclerosis. No aortic aneurysm. Patent portal vein. Reproductive: Quiescent uterus and ovaries. Other: Stable lobulated 3.3 cm cystic density in the right inguinal canal. No inflammatory changes. Trace free fluid in the pelvis. There is presacral edema.  No free air or intra-abdominal abscess. Musculoskeletal: Bones under mineralized  without acute osseous abnormality. IMPRESSION: 1. Progressive colonic wall thickening and pericolonic edema from prior exam. Colitis now extends from the hepatic flexure to the rectum, previously isolated to the sigmoid colon. There is no perforation or evidence of toxic megacolon. 2. Small bilateral pleural effusions and compressive atelectasis, similar to prior exam. 3. Multiple bilateral renal cysts. Low-density cystic structure adjacent to the lower pole of the right kidney measuring 8.9 cm may be exophytic or adjacent, unchanged from prior exam. No suspicious characteristics. 4. Cystic lesion in the right inguinal canal is stable, of doubtful clinical significance. 5. Additional stable chronic findings as described. Aortic Atherosclerosis (ICD10-I70.0). Electronically Signed   By: Keith Rake M.D.   On: 05/09/2020 00:30   DG ABD ACUTE 2+V W 1V CHEST  Result Date: 05/08/2020 CLINICAL DATA:  Distended abdomen EXAM: DG ABDOMEN ACUTE W/ 1V CHEST COMPARISON:  CT 04/13/2020 FINDINGS: Cardiomegaly. Calcified pleural plaques laterally in the left lower hemithorax. Nonobstructive bowel gas pattern. No free air organomegaly. No suspicious calcification. IMPRESSION: No evidence of bowel obstruction or free air. Cardiomegaly.  Calcified left lower chest pleural plaques. Electronically Signed   By: Rolm Baptise M.D.   On: 05/08/2020 19:07    Assessment: Very pleasant 86 year old female presenting with first recurrence Cdiff colitis, previously treated in early June with oral vancomycin and recurrent of symptoms after completing course. She has noted improvement in frequency and consistency of stool output, responding well to the resumption of vancomycin. Desires to increase diet. Will increase diet to soft for now. Clinical course uncomplicated.   Thromboctyopenia: noted during hospital admission but overall stable from yesterday. Multifactorial in setting of acute illness. Vancomycin could possibly play a  role as well. Continue to follow.   She is doing well on vancomycin at this time. Interestingly, [facility] most recently (June 2021),  updated focused guidelines for Cdiff treatment suggesting fidaxomicin for initial and recurrent disease as first line if available but vancomycin also remains acceptable alternative, as she is appropriately on.  If she fails to improve in future on vancomycin or recurrence, would recommend fidaxomicin.    Plan: Advance to soft diet Continue vancomycin 125 mg orally QID for total of 4 weeks, followed by taper as previously outlined and noted in MAR. Would recommend prolonged taper in light of age, comorbidities, and first recurrence.  Needs close follow-up in clinic, especially as approaching end of treatment course Repeat CBC in am   Annitta Needs, PhD, ANP-BC [facility]      LOS: 2 days    05/10/2020, 9:45 AM

## 2020-05-10 NOTE — Progress Notes (Signed)
PROGRESS NOTE    Kathryn Marquez  NAT:[id number] DOB: 1921-04-02 DOA: 05/08/2020 PCP: Redmond School, MD   Brief Narrative:  86 y.o.female,with medical history of chronic atrial fibrillation, on anticoagulation with Eliquis, hypertension, hyperlipidemia recent diagnosis of C. difficile colitis and she was admitted to the hospital from June 3 to June 5. She was discharged on 15 days of vancomycin. As per patient's daughter, patient's diarrhea had improved she received last dose of vancomycin a week ago. This morning patient started having diarrhea again. She was brought to the hospital and found to be febrile. She had multiple episodes of loose bowel movements in the hospital.  Patient also complains of left lower quadrant abdominal pain.  Denies chest pain or shortness of breath.  Denies vomiting, but complains of nausea.  Denies dysuria  Assessment & Plan:   Principal Problem:   C. difficile colitis Active Problems:   Hypertension   Chronic atrial fibrillation (HCC)   Dyslipidemia, goal LDL below 100   Hypokalemia   Abdominal pain   Colitis   Dehydration   1. Recurrent C diff colitis - Failed initial treatment.  Pt initially felt better and responded to treatments but symptoms rapidly returned after she completed the vancomycin.  Oral tablets of vancomycin will be prescribed at discharge-this was reviewed with [facility] team. Appreciate the GI service consult and management recommendations.  2. Chronic atrial fibrillation - stable, continue home metoprolol and xarelto.  3. Hypomagnesemia - replete and follow.  4. Essential hypertension -stable, controlled.   DVT prophylaxis: xarelto  Code Status: DNR  Family Communication: son at bedside  Disposition:   Status is: Inpatient  Remains inpatient appropriate because:Persistent severe electrolyte disturbances and Inpatient level of care appropriate due to severity of illness  Dispo: The patient is from: Home   Anticipated d/c is to: Home  Anticipated d/c date is: 1 day  Patient currently is not medically stable to d/c.  Consultants:   GI   Procedures:     Antimicrobials:  Vancomycin 6/29>>   Subjective: Patient seen and evaluated today with no new acute complaints or concerns. No acute concerns or events noted overnight.  Her bowel movements are decreasing in frequency with 2 noted overnight.  She denies any further abdominal pain and would like her diet advanced.  Objective: Vitals:   05/09/20 1237 05/09/20 2003 05/09/20 2119 05/10/20 0453  BP: 102/90  (!) 145/68 127/81  Pulse: 87  96 77  Resp: 16  20 20   Temp: 99.1 F (37.3 C)  99 F (37.2 C) 98.3 F (36.8 C)  TempSrc: Oral  Oral Oral  SpO2: 96% 96% 97% 97%  Weight:      Height:        Intake/Output Summary (Last 24 hours) at 05/10/2020 1219 Last data filed at 05/09/2020 1700 Gross per 24 hour  Intake 1324.89 ml  Output --  Net 1324.89 ml   Filed Weights   05/08/20 1720  Weight: 61.1 kg    Examination:  General exam: Appears calm and comfortable  Respiratory system: Clear to auscultation. Respiratory effort normal. Cardiovascular system: S1 & S2 heard, RRR. No JVD, murmurs, rubs, gallops or clicks. No pedal edema. Gastrointestinal system: Abdomen is nondistended, soft and nontender. No organomegaly or masses felt. Normal bowel sounds heard. Central nervous system: Alert and oriented. No focal neurological deficits. Extremities: Symmetric 5 x 5 power. Skin: No rashes, lesions or ulcers Psychiatry: Judgement and insight appear normal. Mood & affect appropriate.     Data Reviewed:  I have personally reviewed following labs and imaging studies  CBC: Recent Labs  Lab 05/08/20 1902 05/09/20 0540 05/10/20 0541  WBC 10.4 7.1 5.3  NEUTROABS 9.0*  --   --   HGB 15.1* 13.7 12.9  HCT 48.1* 43.2 40.2  MCV 94.1 91.3 91.4  PLT 102* 89* 85*   Basic Metabolic Panel: Recent Labs  Lab  05/08/20 1902 05/09/20 0540 05/10/20 0541  NA 139 140 138  K 4.0 3.1* 3.6  CL 100 102 103  CO2 27 26 30   GLUCOSE 125* 138* 103*  BUN 15 15 14   CREATININE 0.67 0.52 0.58  CALCIUM 8.9 8.3* 8.4*  MG 1.6* 2.1 2.0   GFR: Estimated Creatinine Clearance: 35.5 mL/min (by C-G formula based on SCr of 0.58 mg/dL). Liver Function Tests: Recent Labs  Lab 05/08/20 1902 05/09/20 0540  AST 25 20  ALT 18 16  ALKPHOS 71 54  BILITOT 1.7* 1.5*  PROT 6.7 5.7*  ALBUMIN 3.9 3.2*   No results for input(s): LIPASE, AMYLASE in the last 168 hours. No results for input(s): AMMONIA in the last 168 hours. Coagulation Profile: No results for input(s): INR, PROTIME in the last 168 hours. Cardiac Enzymes: No results for input(s): CKTOTAL, CKMB, CKMBINDEX, TROPONINI in the last 168 hours. BNP (last 3 results) No results for input(s): PROBNP in the last 8760 hours. HbA1C: No results for input(s): HGBA1C in the last 72 hours. CBG: No results for input(s): GLUCAP in the last 168 hours. Lipid Profile: No results for input(s): CHOL, HDL, LDLCALC, TRIG, CHOLHDL, LDLDIRECT in the last 72 hours. Thyroid Function Tests: No results for input(s): TSH, T4TOTAL, FREET4, T3FREE, THYROIDAB in the last 72 hours. Anemia Panel: No results for input(s): VITAMINB12, FOLATE, FERRITIN, TIBC, IRON, RETICCTPCT in the last 72 hours. Sepsis Labs: Recent Labs  Lab 05/08/20 1904 05/08/20 2150  LATICACIDVEN 1.4 1.2    Recent Results (from the past 240 hour(s))  Urine Culture     Status: None   Collection Time: 05/08/20  6:43 PM   Specimen: Urine, Catheterized  Result Value Ref Range Status   Specimen Description   Final    URINE, CATHETERIZED Performed at [facility], [address] [place], [place] [address]    Special Requests   Final    NONE Performed at [facility], [address] [place], [place] [address]    Culture   Final    NO GROWTH Performed at [facility], [place] [address], [place],  [place] [address]    Report Status 05/10/2020 FINAL  Final  SARS Coronavirus 2 by RT PCR (hospital order, performed in [facility] hospital lab) Nasopharyngeal Nasopharyngeal Swab     Status: None   Collection Time: 05/08/20  9:35 PM   Specimen: Nasopharyngeal Swab  Result Value Ref Range Status   SARS Coronavirus 2 NEGATIVE NEGATIVE Final    Comment: (NOTE) SARS-CoV-2 target nucleic acids are NOT DETECTED.  The SARS-CoV-2 RNA is generally detectable in upper and lower respiratory specimens during the acute phase of infection. The lowest concentration of SARS-CoV-2 viral copies this assay can detect is 250 copies / mL. A negative result does not preclude SARS-CoV-2 infection and should not be used as the sole basis for treatment or other patient management decisions.  A negative result may occur with improper specimen collection / handling, submission of specimen other than nasopharyngeal swab, presence of viral mutation(s) within the areas targeted by this assay, and inadequate number of viral copies (<250 copies /  mL). A negative result must be combined with clinical observations, patient history, and epidemiological information.  Fact Sheet for Patients:   StrictlyIdeas.no  Fact Sheet for Healthcare Providers: BankingDealers.co.za  This test is not yet approved or  cleared by the [place] FDA and has been authorized for detection and/or diagnosis of SARS-CoV-2 by FDA under an Emergency Use Authorization (EUA).  This EUA will remain in effect (meaning this test can be used) for the duration of the COVID-19 declaration under Section 564(b)(1) of the Act, 21 U.S.C. section 360bbb-3(b)(1), unless the authorization is terminated or revoked sooner.  Performed at [facility], [address], [place], [place] [address]   Blood culture (routine x 2)     Status: None (Preliminary result)   Collection Time: 05/08/20 10:01 PM   Specimen: Left  Antecubital; Blood  Result Value Ref Range Status   Specimen Description LEFT ANTECUBITAL  Final   Special Requests   Final    BOTTLES DRAWN AEROBIC AND ANAEROBIC Blood Culture adequate volume   Culture   Final    NO GROWTH < 12 HOURS Performed at [facility], [address] [place], [place] [address]    Report Status PENDING  Incomplete  Blood culture (routine x 2)     Status: None (Preliminary result)   Collection Time: 05/08/20 10:01 PM   Specimen: BLOOD LEFT WRIST  Result Value Ref Range Status   Specimen Description BLOOD LEFT WRIST  Final   Special Requests   Final    BOTTLES DRAWN AEROBIC AND ANAEROBIC Blood Culture adequate volume   Culture   Final    NO GROWTH < 12 HOURS Performed at [facility], [address] [place], [place] [address]    Report Status PENDING  Incomplete  C Difficile Quick Screen w PCR reflex     Status: Abnormal   Collection Time: 05/08/20 11:34 PM   Specimen: STOOL  Result Value Ref Range Status   C Diff antigen POSITIVE (A) NEGATIVE Final   C Diff toxin POSITIVE (A) NEGATIVE Final   C Diff interpretation Toxin producing C. difficile detected.  Final    Comment: CRITICAL RESULT CALLED TO, READ BACK BY AND VERIFIED WITH: B JOHNSON,RN@[email or web address]  05/09/20 [facility] Performed at [facility], [address] [place], [place] [address]          Radiology Studies: CT ABDOMEN PELVIS W CONTRAST  Result Date: 05/09/2020 CLINICAL DATA:  86 year old with decreased p.o. intake and lethargy. Generalized abdominal pain. Finished antibiotics yesterday for treatment of C diff. Diverticulitis suspected EXAM: CT ABDOMEN AND PELVIS WITH CONTRAST TECHNIQUE: Multidetector CT imaging of the abdomen and pelvis was performed using the standard protocol following bolus administration of intravenous contrast. CONTRAST:  78mL OMNIPAQUE IOHEXOL 300 MG/ML  SOLN COMPARISON:  CT 04/14/2019 FINDINGS: Lower chest: Cardiomegaly with predominantly right heart biatrial dilatation. Small  pericardial effusion. There are small bilateral pleural effusions and compressive atelectasis, similar to prior exam. Hepatobiliary: No focal hepatic abnormality. The gallbladder is distended without pericholecystic inflammation or calcified gallstone. There is no biliary dilatation. Pancreas: Mild parenchymal atrophy. No ductal dilatation or inflammation. Spleen: Normal in size without focal abnormality. Adrenals/Urinary Tract: No adrenal nodule. No hydronephrosis. Multiple bilateral renal cysts. Low-density cystic structure adjacent to the lower pole of the right kidney measuring 8.9 cm may be exophytic or adjacent, unchanged and nonenhancing. Urinary bladder is unremarkable. Stomach/Bowel: Progressive colonic wall thickening and pericolonic edema from prior exam. Wall thickening now extends at least from the hepatic flexure to the  rectum. Greatest degree of inflammatory changes adjacent to the rectum which is distended with stool. Rectal distension is 5.8 cm. There is formed stool in the colon. No evidence of abnormal colonic distension, pneumatosis, or obstruction. No significant colonic diverticulosis. Appendix not well visualized on the current exam. There is no small bowel obstruction or inflammation. Moderate-sized duodenal diverticulum without inflammatory change. Vascular/Lymphatic: Aortic atherosclerosis. No aortic aneurysm. Patent portal vein. Reproductive: Quiescent uterus and ovaries. Other: Stable lobulated 3.3 cm cystic density in the right inguinal canal. No inflammatory changes. Trace free fluid in the pelvis. There is presacral edema. No free air or intra-abdominal abscess. Musculoskeletal: Bones under mineralized without acute osseous abnormality. IMPRESSION: 1. Progressive colonic wall thickening and pericolonic edema from prior exam. Colitis now extends from the hepatic flexure to the rectum, previously isolated to the sigmoid colon. There is no perforation or evidence of toxic megacolon. 2.  Small bilateral pleural effusions and compressive atelectasis, similar to prior exam. 3. Multiple bilateral renal cysts. Low-density cystic structure adjacent to the lower pole of the right kidney measuring 8.9 cm may be exophytic or adjacent, unchanged from prior exam. No suspicious characteristics. 4. Cystic lesion in the right inguinal canal is stable, of doubtful clinical significance. 5. Additional stable chronic findings as described. Aortic Atherosclerosis (ICD10-I70.0). Electronically Signed   By: Keith Rake M.D.   On: 05/09/2020 00:30   DG ABD ACUTE 2+V W 1V CHEST  Result Date: 05/08/2020 CLINICAL DATA:  Distended abdomen EXAM: DG ABDOMEN ACUTE W/ 1V CHEST COMPARISON:  CT 04/13/2020 FINDINGS: Cardiomegaly. Calcified pleural plaques laterally in the left lower hemithorax. Nonobstructive bowel gas pattern. No free air organomegaly. No suspicious calcification. IMPRESSION: No evidence of bowel obstruction or free air. Cardiomegaly.  Calcified left lower chest pleural plaques. Electronically Signed   By: Rolm Baptise M.D.   On: 05/08/2020 19:07        Scheduled Meds: . aspirin  81 mg Oral QPM  . loratadine  10 mg Oral Daily  . metoprolol succinate  25 mg Oral Daily  . nystatin   Topical TID  . potassium chloride  40 mEq Oral Once  . Rivaroxaban  15 mg Oral Q supper  . saccharomyces boulardii  250 mg Oral BID  . vancomycin  125 mg Oral QID   Followed by  . [START ON 05/23/2020] vancomycin  125 mg Oral BID   Followed by  . [START ON 05/30/2020] vancomycin  125 mg Oral Daily   Followed by  . [START ON 06/06/2020] vancomycin  125 mg Oral QODAY   Followed by  . [START ON 06/14/2020] vancomycin  125 mg Oral Q3 days    LOS: 2 days    Time spent: 30 minutes    Keyondre Hepburn Darleen Crocker, DO [facility]  If 7PM-7AM, please contact night-coverage [email or web address] 05/10/2020, 12:19 PM

## 2020-05-11 LAB — CBC
HCT: 40 % (ref 36.0–46.0)
Hemoglobin: 12.9 g/dL (ref 12.0–15.0)
MCH: 29.7 pg (ref 26.0–34.0)
MCHC: 32.3 g/dL (ref 30.0–36.0)
MCV: 92 fL (ref 80.0–100.0)
Platelets: 94 10*3/uL — ABNORMAL LOW (ref 150–400)
RBC: 4.35 MIL/uL (ref 3.87–5.11)
RDW: 15.7 % — ABNORMAL HIGH (ref 11.5–15.5)
WBC: 4.1 10*3/uL (ref 4.0–10.5)
nRBC: 0 % (ref 0.0–0.2)

## 2020-05-11 LAB — BASIC METABOLIC PANEL
Anion gap: 8 (ref 5–15)
BUN: 13 mg/dL (ref 8–23)
CO2: 27 mmol/L (ref 22–32)
Calcium: 8.4 mg/dL — ABNORMAL LOW (ref 8.9–10.3)
Chloride: 103 mmol/L (ref 98–111)
Creatinine, Ser: 0.53 mg/dL (ref 0.44–1.00)
GFR calc Af Amer: >60 mL/min (ref >60)
GFR calc non Af Amer: >60 mL/min (ref >60)
Glucose, Bld: 94 mg/dL (ref 70–99)
Potassium: 3.3 mmol/L — ABNORMAL LOW (ref 3.5–5.1)
Sodium: 138 mmol/L (ref 135–145)

## 2020-05-11 MED ORDER — SACCHAROMYCES BOULARDII 250 MG PO CAPS: 250.0000 mg | ORAL_CAPSULE | Freq: Two times a day (BID) | ORAL | 3 refills | Status: DC

## 2020-05-11 MED ORDER — POTASSIUM CHLORIDE ER 10 MEQ PO TBCR: 10.0000 meq | EXTENDED_RELEASE_TABLET | Freq: Every day | ORAL | 0 refills | Status: DC

## 2020-05-11 MED ORDER — VANCOMYCIN HCL 125 MG PO CAPS: ORAL_CAPSULE | ORAL | 0 refills | Status: DC

## 2020-05-11 MED ORDER — POTASSIUM CHLORIDE CRYS ER 20 MEQ PO TBCR
40.0000 meq | EXTENDED_RELEASE_TABLET | Freq: Once | ORAL | Status: AC
  Administered 2020-05-11: 40 meq via ORAL
  Filled 2020-05-11: qty 2

## 2020-05-11 NOTE — Discharge Summary (Signed)
Physician Discharge Summary  MALICIA BLASDEL ZES:[id number] DOB: 1921/02/17 DOA: 05/08/2020  PCP: Redmond School, MD  Admit date: 05/08/2020  Discharge date: 05/11/2020  Admitted From:Home  Disposition:  Home  Recommendations for Outpatient Follow-up:  1. Follow up with PCP in 1-2 weeks 2. Follow-up with Dr. Laural Golden as noted with call in 3 weeks 3. Vancomycin taper with capsules ordered for total 8 weeks as noted per GI recommendations 4. Continue on potassium supplementation for 5 more days and follow-up BMP in 1 week with PCP  Home Health: None  Equipment/Devices: None  Discharge Condition: Stable  CODE STATUS: DNR  Diet recommendation: Heart Healthy  Brief/Interim Summary: 86 y.o.female,with medical history of chronic atrial fibrillation, on anticoagulation with Eliquis, hypertension, hyperlipidemia recent diagnosis of C. difficile colitis and she was admitted to the hospital from June 3 to June 5. She was discharged on 15 days of vancomycin. As per patient's daughter, patient's diarrhea had improved she received last dose of vancomycin a week ago. This morning patient started having diarrhea again. She was brought to the hospital and found to be febrile. She had multiple episodes of loose bowel movements in the hospital. Patient also complains of left lower quadrant abdominal pain. Denies chest pain or shortness of breath. Denies vomiting, but complains of nausea. Denies dysuria.  1. Recurrent C diff colitis - Failed initial treatment. Pt initially felt better and responded to treatments but symptoms rapidly returned after she completed the vancomycin.  Oral tablets of vancomycin will be prescribed at discharge-due to cost considerations.  She has improved quite rapidly.  Anticipate 8 total weeks of vancomycin taper as prescribed. 2. Chronic atrial fibrillation - stable, continue home metoprolol and xarelto.  3. Mild hypokalemia.  Repletion ordered orally while inpatient  and with further potassium supplementation outpatient.  Recommended follow-up with BMP with PCP in 1 week.  4. Essential hypertension -stable, controlled.Continue home metoprolol.  Discharge Diagnoses:  Principal Problem:   C. difficile colitis Active Problems:   Hypertension   Chronic atrial fibrillation (HCC)   Dyslipidemia, goal LDL below 100   Hypokalemia   Abdominal pain   Colitis   Dehydration  Principal discharge diagnosis: Recurrent C. difficile colitis.  Discharge Instructions  Discharge Instructions    Diet - low sodium heart healthy   Complete by: As directed    Increase activity slowly   Complete by: As directed      Allergies as of 05/11/2020   No Known Allergies     Medication List    STOP taking these medications   diphenoxylate-atropine 2.5-0.025 MG tablet Commonly known as: LOMOTIL   vancomycin 50 mg/mL  oral solution Commonly known as: VANCOCIN     TAKE these medications   aspirin 81 MG chewable tablet Chew 2 tablets (162 mg total) by mouth every morning. What changed:   how much to take  when to take this   cetirizine 10 MG tablet Commonly known as: ZYRTEC Take 10 mg by mouth daily.   hydrocortisone 25 MG suppository Commonly known as: ANUSOL-HC Place 25 mg rectally daily as needed.   metoprolol succinate 25 MG 24 hr tablet Commonly known as: TOPROL-XL Take 1 tablet (25 mg total) by mouth daily. New blood pressure medicine.   potassium chloride 10 MEQ tablet Commonly known as: KLOR-CON Take 1 tablet (10 mEq total) by mouth daily for 5 days.   saccharomyces boulardii 250 MG capsule Commonly known as: FLORASTOR Take 1 capsule (250 mg total) by mouth 2 (two) times daily.  vancomycin 125 MG capsule Commonly known as: VANCOCIN Take 1 capsule (125 mg total) by mouth 4 (four) times daily for 14 days, THEN 1 capsule (125 mg total) 2 (two) times daily for 7 days, THEN 1 capsule (125 mg total) daily for 7 days, THEN 1 capsule (125 mg  total) every other day for 28 days. Start taking on: May 11, 2020   Xarelto 15 MG Tabs tablet Generic drug: Rivaroxaban Take 15 mg by mouth daily.       Follow-up Information    Redmond School, MD Follow up in 1 week(s).   Specialty: Internal Medicine Contact information: [address] [place] [address] [phone]        Rogene Houston, MD Follow up in 3 week(s).   Specialty: Gastroenterology Contact information: [place], [address] [place] [address] [phone]              No Known Allergies  Consultations:  GI   Procedures/Studies: CT ABDOMEN PELVIS W CONTRAST  Result Date: 05/09/2020 CLINICAL DATA:  86 year old with decreased p.o. intake and lethargy. Generalized abdominal pain. Finished antibiotics yesterday for treatment of C diff. Diverticulitis suspected EXAM: CT ABDOMEN AND PELVIS WITH CONTRAST TECHNIQUE: Multidetector CT imaging of the abdomen and pelvis was performed using the standard protocol following bolus administration of intravenous contrast. CONTRAST:  33mL OMNIPAQUE IOHEXOL 300 MG/ML  SOLN COMPARISON:  CT 04/14/2019 FINDINGS: Lower chest: Cardiomegaly with predominantly right heart biatrial dilatation. Small pericardial effusion. There are small bilateral pleural effusions and compressive atelectasis, similar to prior exam. Hepatobiliary: No focal hepatic abnormality. The gallbladder is distended without pericholecystic inflammation or calcified gallstone. There is no biliary dilatation. Pancreas: Mild parenchymal atrophy. No ductal dilatation or inflammation. Spleen: Normal in size without focal abnormality. Adrenals/Urinary Tract: No adrenal nodule. No hydronephrosis. Multiple bilateral renal cysts. Low-density cystic structure adjacent to the lower pole of the right kidney measuring 8.9 cm may be exophytic or adjacent, unchanged and nonenhancing. Urinary bladder is unremarkable. Stomach/Bowel: Progressive colonic wall  thickening and pericolonic edema from prior exam. Wall thickening now extends at least from the hepatic flexure to the rectum. Greatest degree of inflammatory changes adjacent to the rectum which is distended with stool. Rectal distension is 5.8 cm. There is formed stool in the colon. No evidence of abnormal colonic distension, pneumatosis, or obstruction. No significant colonic diverticulosis. Appendix not well visualized on the current exam. There is no small bowel obstruction or inflammation. Moderate-sized duodenal diverticulum without inflammatory change. Vascular/Lymphatic: Aortic atherosclerosis. No aortic aneurysm. Patent portal vein. Reproductive: Quiescent uterus and ovaries. Other: Stable lobulated 3.3 cm cystic density in the right inguinal canal. No inflammatory changes. Trace free fluid in the pelvis. There is presacral edema. No free air or intra-abdominal abscess. Musculoskeletal: Bones under mineralized without acute osseous abnormality. IMPRESSION: 1. Progressive colonic wall thickening and pericolonic edema from prior exam. Colitis now extends from the hepatic flexure to the rectum, previously isolated to the sigmoid colon. There is no perforation or evidence of toxic megacolon. 2. Small bilateral pleural effusions and compressive atelectasis, similar to prior exam. 3. Multiple bilateral renal cysts. Low-density cystic structure adjacent to the lower pole of the right kidney measuring 8.9 cm may be exophytic or adjacent, unchanged from prior exam. No suspicious characteristics. 4. Cystic lesion in the right inguinal canal is stable, of doubtful clinical significance. 5. Additional stable chronic findings as described. Aortic Atherosclerosis (ICD10-I70.0). Electronically Signed   By: Keith Rake M.D.   On: 05/09/2020 00:30  CT ABDOMEN PELVIS W CONTRAST  Result Date: 04/13/2020 CLINICAL DATA:  Acute abdominal pain EXAM: CT ABDOMEN AND PELVIS WITH CONTRAST TECHNIQUE: Multidetector CT imaging  of the abdomen and pelvis was performed using the standard protocol following bolus administration of intravenous contrast. CONTRAST:  185mL OMNIPAQUE IOHEXOL 300 MG/ML  SOLN COMPARISON:  None. FINDINGS: Lower chest: There is moderate cardiomegaly. Small bilateral pleural effusions are seen, right greater than left. There is rounded airspace opacity at the left lung base. Hepatobiliary: The liver is normal in density without focal abnormality.The main portal vein is patent. No evidence of calcified gallstones, gallbladder wall thickening or biliary dilatation. Pancreas: Unremarkable. No pancreatic ductal dilatation or surrounding inflammatory changes. Spleen: Normal in size without focal abnormality. Adrenals/Urinary Tract: Both adrenal glands appear normal. Multiple low-density lesions are seen throughout both kidneys the largest measuring 2 cm in the lower pole of the left kidney. Adjacent to the inferior pole of the right kidney there is a low-density cystic mass measuring 8.7 x 7.8 cm with which appears to either be extending from the lower pole or just adjacent. Stomach/Bowel: The stomach and small bowel are normal in appearance. There is a 10 cm segment of sigmoid colon with diffuse wall thickening surrounding fat stranding changes. No definite free air or loculated fluid collections are noted. There is a moderate amount of colonic stool present. Presacral edema is noted. Vascular/Lymphatic: There are no enlarged mesenteric, retroperitoneal, or pelvic lymph nodes. Scattered aortic atherosclerotic calcifications are seen without aneurysmal dilatation. Reproductive: The uterus and adnexa are unremarkable. Other: Within the right inguinal region there is a 3.6 cm low-density cystic mass. Musculoskeletal: No acute or significant osseous findings. IMPRESSION: Findings consistent with acute sigmoid colonic diverticulitis. No definite loculated fluid collections or free air. Large cystic mass seen adjacent to the  inferior pole the right kidney measuring 8.7 x 7.8 cm which appears to possibly be extending from the lower pole of the right kidney or just adjacent to. Likely represents a simple renal cyst or possible mesenteric cyst. Aortic Atherosclerosis (ICD10-I70.0). Electronically Signed   By: Prudencio Pair M.D.   On: 04/13/2020 23:17   DG Chest Port 1 View  Result Date: 04/13/2020 CLINICAL DATA:  Shortness of breath EXAM: PORTABLE CHEST 1 VIEW COMPARISON:  04/05/2020 FINDINGS: Cardiac shadow is enlarged but stable. Aortic calcifications are again seen. Chronic calcified pleural plaques are noted on the left with some scarring in the left base. Old rib fractures are noted on the left. No new focal infiltrate is seen. No acute bony abnormality is noted. IMPRESSION: Chronic changes similar to that seen on prior exam. No acute abnormality noted. Electronically Signed   By: Inez Catalina M.D.   On: 04/13/2020 23:00   DG ABD ACUTE 2+V W 1V CHEST  Result Date: 05/08/2020 CLINICAL DATA:  Distended abdomen EXAM: DG ABDOMEN ACUTE W/ 1V CHEST COMPARISON:  CT 04/13/2020 FINDINGS: Cardiomegaly. Calcified pleural plaques laterally in the left lower hemithorax. Nonobstructive bowel gas pattern. No free air organomegaly. No suspicious calcification. IMPRESSION: No evidence of bowel obstruction or free air. Cardiomegaly.  Calcified left lower chest pleural plaques. Electronically Signed   By: Rolm Baptise M.D.   On: 05/08/2020 19:07     Discharge Exam: Vitals:   05/10/20 2043 05/11/20 0507  BP: (!) 149/84 (!) 163/85  Pulse: 67 64  Resp: 20 18  Temp: 98.7 F (37.1 C) 98.2 F (36.8 C)  SpO2: 98% 96%   Vitals:   05/10/20 0453 05/10/20 1500 05/10/20 2043  05/11/20 0507  BP: 127/81 118/86 (!) 149/84 (!) 163/85  Pulse: 77  67 64  Resp: 20 18 20 18   Temp: 98.3 F (36.8 C) 98.8 F (37.1 C) 98.7 F (37.1 C) 98.2 F (36.8 C)  TempSrc: Oral Oral Oral Oral  SpO2: 97% 99% 98% 96%  Weight:      Height:        General:  Pt is alert, awake, not in acute distress Cardiovascular: RRR, S1/S2 +, no rubs, no gallops Respiratory: CTA bilaterally, no wheezing, no rhonchi Abdominal: Soft, NT, ND, bowel sounds + Extremities: no edema, no cyanosis    The results of significant diagnostics from this hospitalization (including imaging, microbiology, ancillary and laboratory) are listed below for reference.     Microbiology: Recent Results (from the past 240 hour(s))  Urine Culture     Status: None   Collection Time: 05/08/20  6:43 PM   Specimen: Urine, Catheterized  Result Value Ref Range Status   Specimen Description   Final    URINE, CATHETERIZED Performed at [facility], [address], [place], [place] [address]    Special Requests   Final    NONE Performed at [facility], [address], [place], [place] [address]    Culture   Final    NO GROWTH Performed at [facility], [place] [address], [place], [place] [address]    Report Status 05/10/2020 FINAL  Final  SARS Coronavirus 2 by RT PCR (hospital order, performed in [facility] hospital lab) Nasopharyngeal Nasopharyngeal Swab     Status: None   Collection Time: 05/08/20  9:35 PM   Specimen: Nasopharyngeal Swab  Result Value Ref Range Status   SARS Coronavirus 2 NEGATIVE NEGATIVE Final    Comment: (NOTE) SARS-CoV-2 target nucleic acids are NOT DETECTED.  The SARS-CoV-2 RNA is generally detectable in upper and lower respiratory specimens during the acute phase of infection. The lowest concentration of SARS-CoV-2 viral copies this assay can detect is 250 copies / mL. A negative result does not preclude SARS-CoV-2 infection and should not be used as the sole basis for treatment or other patient management decisions.  A negative result may occur with improper specimen collection / handling, submission of specimen other than nasopharyngeal swab, presence of viral mutation(s) within the areas targeted by this assay, and inadequate number of viral  copies (<250 copies / mL). A negative result must be combined with clinical observations, patient history, and epidemiological information.  Fact Sheet for Patients:   StrictlyIdeas.no  Fact Sheet for Healthcare Providers: BankingDealers.co.za  This test is not yet approved or  cleared by the [place] FDA and has been authorized for detection and/or diagnosis of SARS-CoV-2 by FDA under an Emergency Use Authorization (EUA).  This EUA will remain in effect (meaning this test can be used) for the duration of the COVID-19 declaration under Section 564(b)(1) of the Act, 21 U.S.C. section 360bbb-3(b)(1), unless the authorization is terminated or revoked sooner.  Performed at [facility], [address], [place],  [address]   Blood culture (routine x 2)     Status: None (Preliminary result)   Collection Time: 05/08/20 10:01 PM   Specimen: Left Antecubital; Blood  Result Value Ref Range Status   Specimen Description LEFT ANTECUBITAL  Final   Special Requests   Final    BOTTLES DRAWN AEROBIC AND ANAEROBIC Blood Culture adequate volume   Culture   Final    NO GROWTH 3 DAYS Performed at [facility],  [address], [place], [place] [address]    Report Status PENDING  Incomplete  Blood culture (routine x 2)     Status: None (Preliminary result)   Collection Time: 05/08/20 10:01 PM   Specimen: BLOOD LEFT WRIST  Result Value Ref Range Status   Specimen Description BLOOD LEFT WRIST  Final   Special Requests   Final    BOTTLES DRAWN AEROBIC AND ANAEROBIC Blood Culture adequate volume   Culture   Final    NO GROWTH 3 DAYS Performed at [facility], [address], [place], [place] [address]    Report Status PENDING  Incomplete  C Difficile Quick Screen w PCR reflex     Status: Abnormal   Collection Time: 05/08/20 11:34 PM   Specimen: STOOL  Result Value Ref Range Status   C Diff antigen POSITIVE (A) NEGATIVE Final   C Diff toxin  POSITIVE (A) NEGATIVE Final   C Diff interpretation Toxin producing C. difficile detected.  Final    Comment: CRITICAL RESULT CALLED TO, READ BACK BY AND VERIFIED WITH: B JOHNSON,RN@0122  05/09/20 MKELLY Performed at [facility], [address] [place], [place] [address]      Labs: BNP (last 3 results) No results for input(s): BNP in the last 8760 hours. Basic Metabolic Panel: Recent Labs  Lab 05/08/20 1902 05/09/20 0540 05/10/20 0541 05/11/20 0425  NA 139 140 138 138  K 4.0 3.1* 3.6 3.3*  CL 100 102 103 103  CO2 27 26 30 27   GLUCOSE 125* 138* 103* 94  BUN 15 15 14 13   CREATININE 0.67 0.52 0.58 0.53  CALCIUM 8.9 8.3* 8.4* 8.4*  MG 1.6* 2.1 2.0  --    Liver Function Tests: Recent Labs  Lab 05/08/20 1902 05/09/20 0540  AST 25 20  ALT 18 16  ALKPHOS 71 54  BILITOT 1.7* 1.5*  PROT 6.7 5.7*  ALBUMIN 3.9 3.2*   No results for input(s): LIPASE, AMYLASE in the last 168 hours. No results for input(s): AMMONIA in the last 168 hours. CBC: Recent Labs  Lab 05/08/20 1902 05/09/20 0540 05/10/20 0541 05/11/20 0425  WBC 10.4 7.1 5.3 4.1  NEUTROABS 9.0*  --   --   --   HGB 15.1* 13.7 12.9 12.9  HCT 48.1* 43.2 40.2 40.0  MCV 94.1 91.3 91.4 92.0  PLT 102* 89* 85* 94*   Cardiac Enzymes: No results for input(s): CKTOTAL, CKMB, CKMBINDEX, TROPONINI in the last 168 hours. BNP: Invalid input(s): POCBNP CBG: No results for input(s): GLUCAP in the last 168 hours. D-Dimer No results for input(s): DDIMER in the last 72 hours. Hgb A1c No results for input(s): HGBA1C in the last 72 hours. Lipid Profile No results for input(s): CHOL, HDL, LDLCALC, TRIG, CHOLHDL, LDLDIRECT in the last 72 hours. Thyroid function studies No results for input(s): TSH, T4TOTAL, T3FREE, THYROIDAB in the last 72 hours.  Invalid input(s): FREET3 Anemia work up No results for input(s): VITAMINB12, FOLATE, FERRITIN, TIBC, IRON, RETICCTPCT in the last 72 hours. Urinalysis    Component Value  Date/Time   COLORURINE YELLOW 05/08/2020 1843   APPEARANCEUR CLEAR 05/08/2020 1843   LABSPEC 1.019 05/08/2020 1843   PHURINE 5.0 05/08/2020 1843   GLUCOSEU NEGATIVE 05/08/2020 1843   HGBUR MODERATE (A) 05/08/2020 1843   BILIRUBINUR NEGATIVE 05/08/2020 1843   KETONESUR 5 (A) 05/08/2020 1843   PROTEINUR NEGATIVE 05/08/2020 1843   UROBILINOGEN 0.2 04/28/2012 2308   NITRITE NEGATIVE 05/08/2020 1843   LEUKOCYTESUR NEGATIVE 05/08/2020 1843   Sepsis Labs  Invalid input(s): PROCALCITONIN,  WBC,  LACTICIDVEN Microbiology Recent Results (from the past 240 hour(s))  Urine Culture     Status: None   Collection Time: 05/08/20  6:43 PM   Specimen: Urine, Catheterized  Result Value Ref Range Status   Specimen Description   Final    URINE, CATHETERIZED Performed at [facility], [address], [place], [place] [address]    Special Requests   Final    NONE Performed at [facility], [address], [place], [place] [address]    Culture   Final    NO GROWTH Performed at [facility], [place] [address] [place], [place] [address]    Report Status 05/10/2020 FINAL  Final  SARS Coronavirus 2 by RT PCR (hospital order, performed in [facility] hospital lab) Nasopharyngeal Nasopharyngeal Swab     Status: None   Collection Time: 05/08/20  9:35 PM   Specimen: Nasopharyngeal Swab  Result Value Ref Range Status   SARS Coronavirus 2 NEGATIVE NEGATIVE Final    Comment: (NOTE) SARS-CoV-2 target nucleic acids are NOT DETECTED.  The SARS-CoV-2 RNA is generally detectable in upper and lower respiratory specimens during the acute phase of infection. The lowest concentration of SARS-CoV-2 viral copies this assay can detect is 250 copies / mL. A negative result does not preclude SARS-CoV-2 infection and should not be used as the sole basis for treatment or other patient management decisions.  A negative result may occur with improper specimen collection / handling, submission of specimen other than  nasopharyngeal swab, presence of viral mutation(s) within the areas targeted by this assay, and inadequate number of viral copies (<250 copies / mL). A negative result must be combined with clinical observations, patient history, and epidemiological information.  Fact Sheet for Patients:   StrictlyIdeas.no  Fact Sheet for Healthcare Providers: BankingDealers.co.za  This test is not yet approved or  cleared by the [place] FDA and has been authorized for detection and/or diagnosis of SARS-CoV-2 by FDA under an Emergency Use Authorization (EUA).  This EUA will remain in effect (meaning this test can be used) for the duration of the COVID-19 declaration under Section 564(b)(1) of the Act, 21 U.S.C. section 360bbb-3(b)(1), unless the authorization is terminated or revoked sooner.  Performed at [facility], [address], [place], [place] [address]   Blood culture (routine x 2)     Status: None (Preliminary result)   Collection Time: 05/08/20 10:01 PM   Specimen: Left Antecubital; Blood  Result Value Ref Range Status   Specimen Description LEFT ANTECUBITAL  Final   Special Requests   Final    BOTTLES DRAWN AEROBIC AND ANAEROBIC Blood Culture adequate volume   Culture   Final    NO GROWTH 3 DAYS Performed at [facility], [address], [place], [place] [address]    Report Status PENDING  Incomplete  Blood culture (routine x 2)     Status: None (Preliminary result)   Collection Time: 05/08/20 10:01 PM   Specimen: BLOOD LEFT WRIST  Result Value Ref Range Status   Specimen Description BLOOD LEFT WRIST  Final   Special Requests   Final    BOTTLES DRAWN AEROBIC AND ANAEROBIC Blood Culture adequate volume   Culture   Final    NO GROWTH 3 DAYS Performed at [facility], [address], [place],  [address]    Report Status PENDING  Incomplete  C Difficile Quick Screen w PCR reflex     Status: Abnormal  Collection Time:  05/08/20 11:34 PM   Specimen: STOOL  Result Value Ref Range Status   C Diff antigen POSITIVE (A) NEGATIVE Final   C Diff toxin POSITIVE (A) NEGATIVE Final   C Diff interpretation Toxin producing C. difficile detected.  Final    Comment: CRITICAL RESULT CALLED TO, READ BACK BY AND VERIFIED WITH: B JOHNSON,RN@0122  05/09/20 [facility] Performed at [facility], [address], [place], [place] [address]      Time coordinating discharge: 35 minutes  SIGNED:   Rodena Goldmann, DO [facility] 05/11/2020, 10:58 AM  If 7PM-7AM, please contact night-coverage [email or web address]

## 2020-05-11 NOTE — Care Management Important Message (Signed)
Important Message  Patient Details  Name: Kathryn Marquez MRN: [id number] Date of Birth: 1921/10/23   Medicare Important Message Given:  Yes     Boneta Lucks, RN 05/11/2020, 3:08 PM

## 2020-05-11 NOTE — Progress Notes (Signed)
Anticipated d/c today after discussion with Dr. Manuella Ghazi. Prolonged Vancomycin taper as per below (guideline-based):  Vancomycin 125 mg qid x 2 weeks, then Vancomycin 125 mg bid x 1 week, then Vancomycin 125 mg daily x 1 week, then Vancomycin 125 mg qod x 4 weeks, then stop.  Thank you for allowing [place] to participate in the care of [place], DNP, AGNP-C Adult & Gerontological Nurse Practitioner [facility]

## 2020-05-13 LAB — CULTURE, BLOOD (ROUTINE X 2)
Culture: NO GROWTH
Culture: NO GROWTH
Special Requests: ADEQUATE
Special Requests: ADEQUATE

## 2020-05-18 DIAGNOSIS — E876 Hypokalemia: Secondary | ICD-10-CM | POA: Diagnosis not present

## 2020-05-18 DIAGNOSIS — Z6822 Body mass index (BMI) 22.0-22.9, adult: Secondary | ICD-10-CM | POA: Diagnosis not present

## 2020-05-18 DIAGNOSIS — A0471 Enterocolitis due to Clostridium difficile, recurrent: Secondary | ICD-10-CM | POA: Diagnosis not present

## 2020-05-30 ENCOUNTER — Telehealth (INDEPENDENT_AMBULATORY_CARE_PROVIDER_SITE_OTHER): Payer: Self-pay | Admitting: Internal Medicine

## 2020-05-30 NOTE — Telephone Encounter (Signed)
Patient called to make an appointment with you - can she see Dr Jenetta Downer or Thayer Headings?

## 2020-06-02 NOTE — Telephone Encounter (Signed)
Let us not to do this to Kathryn Marquez who is 86 years old. Please find a place for her on my schedule

## 2020-06-09 DIAGNOSIS — I4891 Unspecified atrial fibrillation: Secondary | ICD-10-CM | POA: Diagnosis not present

## 2020-06-09 DIAGNOSIS — E7849 Other hyperlipidemia: Secondary | ICD-10-CM | POA: Diagnosis not present

## 2020-06-09 DIAGNOSIS — I10 Essential (primary) hypertension: Secondary | ICD-10-CM | POA: Diagnosis not present

## 2020-06-10 ENCOUNTER — Inpatient Hospital Stay (HOSPITAL_COMMUNITY): Payer: PPO

## 2020-06-10 ENCOUNTER — Emergency Department (HOSPITAL_COMMUNITY): Payer: PPO

## 2020-06-10 ENCOUNTER — Encounter (HOSPITAL_COMMUNITY): Payer: Self-pay | Admitting: Emergency Medicine

## 2020-06-10 ENCOUNTER — Other Ambulatory Visit: Payer: Self-pay

## 2020-06-10 ENCOUNTER — Inpatient Hospital Stay (HOSPITAL_COMMUNITY)
Admission: EM | Admit: 2020-06-10 | Discharge: 2020-06-15 | DRG: 371 | Disposition: A | Discharge status: Home Health | Payer: PPO | Attending: Family Medicine | Admitting: Family Medicine

## 2020-06-10 DIAGNOSIS — G9341 Metabolic encephalopathy: Secondary | ICD-10-CM | POA: Diagnosis not present

## 2020-06-10 DIAGNOSIS — Z823 Family history of stroke: Secondary | ICD-10-CM

## 2020-06-10 DIAGNOSIS — E785 Hyperlipidemia, unspecified: Secondary | ICD-10-CM | POA: Diagnosis not present

## 2020-06-10 DIAGNOSIS — Z8673 Personal history of transient ischemic attack (TIA), and cerebral infarction without residual deficits: Secondary | ICD-10-CM | POA: Diagnosis not present

## 2020-06-10 DIAGNOSIS — A0471 Enterocolitis due to Clostridium difficile, recurrent: Principal | ICD-10-CM

## 2020-06-10 DIAGNOSIS — I482 Chronic atrial fibrillation, unspecified: Secondary | ICD-10-CM | POA: Diagnosis present

## 2020-06-10 DIAGNOSIS — E860 Dehydration: Secondary | ICD-10-CM | POA: Diagnosis not present

## 2020-06-10 DIAGNOSIS — I69351 Hemiplegia and hemiparesis following cerebral infarction affecting right dominant side: Secondary | ICD-10-CM | POA: Diagnosis not present

## 2020-06-10 DIAGNOSIS — E876 Hypokalemia: Secondary | ICD-10-CM | POA: Diagnosis not present

## 2020-06-10 DIAGNOSIS — R531 Weakness: Secondary | ICD-10-CM | POA: Diagnosis not present

## 2020-06-10 DIAGNOSIS — R197 Diarrhea, unspecified: Secondary | ICD-10-CM | POA: Diagnosis not present

## 2020-06-10 DIAGNOSIS — Z7901 Long term (current) use of anticoagulants: Secondary | ICD-10-CM | POA: Diagnosis not present

## 2020-06-10 DIAGNOSIS — G319 Degenerative disease of nervous system, unspecified: Secondary | ICD-10-CM | POA: Diagnosis not present

## 2020-06-10 DIAGNOSIS — Z20822 Contact with and (suspected) exposure to covid-19: Secondary | ICD-10-CM | POA: Diagnosis not present

## 2020-06-10 DIAGNOSIS — R52 Pain, unspecified: Secondary | ICD-10-CM | POA: Diagnosis not present

## 2020-06-10 DIAGNOSIS — I10 Essential (primary) hypertension: Secondary | ICD-10-CM | POA: Diagnosis present

## 2020-06-10 DIAGNOSIS — R410 Disorientation, unspecified: Secondary | ICD-10-CM | POA: Diagnosis not present

## 2020-06-10 DIAGNOSIS — Z66 Do not resuscitate: Secondary | ICD-10-CM | POA: Diagnosis present

## 2020-06-10 DIAGNOSIS — I6389 Other cerebral infarction: Secondary | ICD-10-CM | POA: Diagnosis not present

## 2020-06-10 DIAGNOSIS — N179 Acute kidney failure, unspecified: Secondary | ICD-10-CM | POA: Diagnosis present

## 2020-06-10 DIAGNOSIS — Z7982 Long term (current) use of aspirin: Secondary | ICD-10-CM

## 2020-06-10 DIAGNOSIS — R4182 Altered mental status, unspecified: Secondary | ICD-10-CM

## 2020-06-10 DIAGNOSIS — Z8249 Family history of ischemic heart disease and other diseases of the circulatory system: Secondary | ICD-10-CM | POA: Diagnosis not present

## 2020-06-10 DIAGNOSIS — R109 Unspecified abdominal pain: Secondary | ICD-10-CM | POA: Diagnosis present

## 2020-06-10 DIAGNOSIS — R9082 White matter disease, unspecified: Secondary | ICD-10-CM | POA: Diagnosis not present

## 2020-06-10 DIAGNOSIS — I4891 Unspecified atrial fibrillation: Secondary | ICD-10-CM | POA: Diagnosis not present

## 2020-06-10 DIAGNOSIS — D696 Thrombocytopenia, unspecified: Secondary | ICD-10-CM | POA: Diagnosis present

## 2020-06-10 DIAGNOSIS — Z79899 Other long term (current) drug therapy: Secondary | ICD-10-CM

## 2020-06-10 LAB — COMPREHENSIVE METABOLIC PANEL
ALT: 26 U/L (ref 0–44)
AST: 34 U/L (ref 15–41)
Albumin: 3.9 g/dL (ref 3.5–5.0)
Alkaline Phosphatase: 73 U/L (ref 38–126)
Anion gap: 12 (ref 5–15)
BUN: 9 mg/dL (ref 8–23)
CO2: 27 mmol/L (ref 22–32)
Calcium: 9.2 mg/dL (ref 8.9–10.3)
Chloride: 98 mmol/L (ref 98–111)
Creatinine, Ser: 0.59 mg/dL (ref 0.44–1.00)
GFR calc Af Amer: >60 mL/min (ref >60)
GFR calc non Af Amer: >60 mL/min (ref >60)
Glucose, Bld: 119 mg/dL — ABNORMAL HIGH (ref 70–99)
Potassium: 3.9 mmol/L (ref 3.5–5.1)
Sodium: 137 mmol/L (ref 135–145)
Total Bilirubin: 1 mg/dL (ref 0.3–1.2)
Total Protein: 6.5 g/dL (ref 6.5–8.1)

## 2020-06-10 LAB — TROPONIN I (HIGH SENSITIVITY): Troponin I (High Sensitivity): 4 ng/L (ref <18)

## 2020-06-10 LAB — CBC
HCT: 43.8 % (ref 36.0–46.0)
Hemoglobin: 13.9 g/dL (ref 12.0–15.0)
MCH: 29.7 pg (ref 26.0–34.0)
MCHC: 31.7 g/dL (ref 30.0–36.0)
MCV: 93.6 fL (ref 80.0–100.0)
Platelets: 106 10*3/uL — ABNORMAL LOW (ref 150–400)
RBC: 4.68 MIL/uL (ref 3.87–5.11)
RDW: 15.3 % (ref 11.5–15.5)
WBC: 5.9 10*3/uL (ref 4.0–10.5)
nRBC: 0 % (ref 0.0–0.2)

## 2020-06-10 LAB — URINALYSIS, ROUTINE W REFLEX MICROSCOPIC
Bilirubin Urine: NEGATIVE
Glucose, UA: NEGATIVE mg/dL
Hgb urine dipstick: NEGATIVE
Ketones, ur: NEGATIVE mg/dL
Leukocytes,Ua: NEGATIVE
Nitrite: NEGATIVE
Protein, ur: NEGATIVE mg/dL
Specific Gravity, Urine: 1.009 (ref 1.005–1.030)
pH: 9 — ABNORMAL HIGH (ref 5.0–8.0)

## 2020-06-10 LAB — BLOOD GAS, VENOUS
Acid-Base Excess: 7.1 mmol/L — ABNORMAL HIGH (ref 0.0–2.0)
Bicarbonate: 27.7 mmol/L (ref 20.0–28.0)
FIO2: 21
O2 Saturation: 26.1 %
Patient temperature: 36.2
pCO2, Ven: 60.3 mmHg — ABNORMAL HIGH (ref 44.0–60.0)
pH, Ven: 7.352 (ref 7.250–7.430)
pO2, Ven: <31 mmHg — CL (ref 32.0–45.0)

## 2020-06-10 LAB — SARS CORONAVIRUS 2 BY RT PCR (HOSPITAL ORDER, PERFORMED IN ~~LOC~~ HOSPITAL LAB): SARS Coronavirus 2: NEGATIVE

## 2020-06-10 LAB — AMMONIA: Ammonia: 9 umol/L (ref 9–35)

## 2020-06-10 MED ORDER — SODIUM CHLORIDE 0.9 % IV BOLUS
500.0000 mL | Freq: Once | INTRAVENOUS | Status: AC
  Administered 2020-06-10: 500 mL via INTRAVENOUS

## 2020-06-10 MED ORDER — STROKE: EARLY STAGES OF RECOVERY BOOK
Freq: Once | Status: AC
  Filled 2020-06-10 (×2): qty 1

## 2020-06-10 MED ORDER — SODIUM CHLORIDE 0.9 % IV SOLN
INTRAVENOUS | Status: AC
  Administered 2020-06-10: 1000 mL via INTRAVENOUS

## 2020-06-10 MED ORDER — VANCOMYCIN HCL 125 MG PO CAPS
125.0000 mg | ORAL_CAPSULE | ORAL | Status: DC
  Filled 2020-06-10 (×2): qty 1

## 2020-06-10 MED ORDER — SACCHAROMYCES BOULARDII 250 MG PO CAPS
250.0000 mg | ORAL_CAPSULE | Freq: Two times a day (BID) | ORAL | Status: DC
  Administered 2020-06-11 – 2020-06-13 (×5): 250 mg via ORAL
  Filled 2020-06-10 (×5): qty 1

## 2020-06-10 MED ORDER — ACETAMINOPHEN 325 MG PO TABS
650.0000 mg | ORAL_TABLET | ORAL | Status: DC | PRN
  Administered 2020-06-11 – 2020-06-13 (×2): 650 mg via ORAL
  Filled 2020-06-10 (×2): qty 2

## 2020-06-10 MED ORDER — ACETAMINOPHEN 650 MG RE SUPP: 650.0000 mg | RECTAL | Status: DC | PRN

## 2020-06-10 MED ORDER — ACETAMINOPHEN 160 MG/5ML PO SOLN: 650.0000 mg | ORAL | Status: DC | PRN

## 2020-06-10 MED ORDER — RIVAROXABAN 15 MG PO TABS
15.0000 mg | ORAL_TABLET | Freq: Every day | ORAL | Status: DC
  Administered 2020-06-11 – 2020-06-15 (×5): 15 mg via ORAL
  Filled 2020-06-10 (×5): qty 1

## 2020-06-10 MED ORDER — SIMVASTATIN 20 MG PO TABS
40.0000 mg | ORAL_TABLET | Freq: Every day | ORAL | Status: DC
  Administered 2020-06-11 – 2020-06-14 (×4): 40 mg via ORAL
  Filled 2020-06-10 (×4): qty 2

## 2020-06-10 MED ORDER — LORATADINE 10 MG PO TABS
10.0000 mg | ORAL_TABLET | Freq: Every day | ORAL | Status: DC
  Administered 2020-06-11 – 2020-06-15 (×5): 10 mg via ORAL
  Filled 2020-06-10 (×5): qty 1

## 2020-06-10 MED ORDER — METOPROLOL SUCCINATE ER 25 MG PO TB24
25.0000 mg | ORAL_TABLET | Freq: Every day | ORAL | Status: DC
  Administered 2020-06-11 – 2020-06-15 (×5): 25 mg via ORAL
  Filled 2020-06-10 (×5): qty 1

## 2020-06-10 NOTE — Progress Notes (Signed)
Obtained report from [employer] RN patient coming to 3w-20

## 2020-06-10 NOTE — ED Notes (Signed)
CRITICAL VALUE ALERT  Critical Value:  PO2 <31  Date & Time Notied:  06/11/2019 @ 3943  Provider Notified: Dr Manuella Ghazi  Orders Received/Actions taken: No orders given

## 2020-06-10 NOTE — Progress Notes (Signed)
Tele called to report pt brady to 40s. Pt assessed and asymptomatic. Vitals taken. HR 50s-60s.    06/10/20 2136  Vitals  Temp 98 F (36.7 C)  Temp Source Oral  BP 118/66  MAP (mmHg) 79  BP Location Left Arm  BP Method Automatic  Patient Position (if appropriate) Lying  Pulse Rate 56  Pulse Rate Source Monitor  ECG Heart Rate (!) 57  Resp 20  Level of Consciousness  Level of Consciousness Alert  MEWS COLOR  MEWS Score Color Green  Oxygen Therapy  SpO2 98 %  O2 Device Room Air  Glasgow Coma Scale  Eye Opening 4  Best Verbal Response (NON-intubated) 4  Best Motor Response 6  Glasgow Coma Scale Score 14  MEWS Score  MEWS Temp 0  MEWS Systolic 0  MEWS Pulse 0  MEWS RR 0  MEWS LOC 0  MEWS Score 0

## 2020-06-10 NOTE — ED Triage Notes (Signed)
Patient brought in via EMS from home. Alert. Patient oriented to person but disoriented to place, situation, and time. Patient being brought in for altered mental status. Per paramedic patient was last seen at her normal yesterday at 6pm when eating supper and noticed to have altered mental status this morning at 7am. Patient currently has diarrhea due to c-diff pre paramedic and has noted not to be drinking well per family. Patient's blood sugar 130 per EMS.

## 2020-06-10 NOTE — ED Provider Notes (Signed)
[facility] Emergency Department Provider Note MRN:  [id number]  Arrival date & time: 06/10/20     Chief Complaint   Altered Mental Status   History of Present Illness   Kathryn Marquez is a 86 y.o. year-old female with a history of stroke presenting to the ED with chief complaint of altered mental status.  Was at baseline last night, has woken up this morning with confusion and altered mental status.  Oriented only to name.  Seems more lethargic as well.  I was unable to obtain an accurate HPI, PMH, or ROS due to the patient's altered mental status.  Level 5 caveat.  Review of Systems  Positive for altered mental status.  Patient's Health History    Past Medical History:  Diagnosis Date  . Acute embolic stroke ([place]) 3/81/0175  . Dyslipidemia, goal LDL below 100 04/30/2012  . Dysrhythmia    Chronic atrial fib  . Hypertension     History reviewed. No pertinent surgical history.  Family History  Problem Relation Age of Onset  . Stroke Other   . Cancer Other   . Heart attack Mother   . Leukemia Sister   . Cancer Brother     Social History   Socioeconomic History  . Marital status: Widowed    Spouse name: Not on file  . Number of children: Not on file  . Years of education: Not on file  . Highest education level: Not on file  Occupational History  . Not on file  Tobacco Use  . Smoking status: Never Smoker  . Smokeless tobacco: Never Used  Vaping Use  . Vaping Use: Never used  Substance and Sexual Activity  . Alcohol use: No  . Drug use: No  . Sexual activity: Not Currently  Other Topics Concern  . Not on file  Social History Narrative  . Not on file   Social Determinants of Health   Financial Resource Strain:   . Difficulty of Paying Living Expenses:   Food Insecurity:   . Worried About Charity fundraiser in the Last Year:   . Arboriculturist in the Last Year:   Transportation Needs:   . Film/video editor (Medical):   Marland Kitchen  Lack of Transportation (Non-Medical):   Physical Activity:   . Days of Exercise per Week:   . Minutes of Exercise per Session:   Stress:   . Feeling of Stress :   Social Connections:   . Frequency of Communication with Friends and Family:   . Frequency of Social Gatherings with Friends and Family:   . Attends Religious Services:   . Active Member of Clubs or Organizations:   . Attends Archivist Meetings:   Marland Kitchen Marital Status:   Intimate Partner Violence:   . Fear of Current or Ex-Partner:   . Emotionally Abused:   Marland Kitchen Physically Abused:   . Sexually Abused:      Physical Exam   Vitals:   06/10/20 1630 06/10/20 1702  BP: (!) 134/84 (!) 134/99  Pulse: 65 66  Resp: 14 16  Temp: (!) 97.5 F (36.4 C)   SpO2: 97% 97%    CONSTITUTIONAL:  Chronically ill-appearing, NAD NEURO:  Awake, oriented only to name, intermittently follows commands, no aphasia EYES:  eyes equal and reactive ENT/NECK:  no LAD, no JVD CARDIO:  regular rate, well-perfused, normal S1 and S2 PULM:  CTAB no wheezing or rhonchi GI/GU:  normal bowel sounds, non-distended, non-tender MSK/SPINE:  No gross deformities, no edema SKIN:  no rash, atraumatic PSYCH:  Appropriate speech and behavior  *Additional and/or pertinent findings included in MDM below  Diagnostic and Interventional Summary    EKG Interpretation  Date/Time:  Saturday June 10 2020 10:28:02 EDT Ventricular Rate:  85 PR Interval:    QRS Duration: 156 QT Interval:  495 QTC Calculation: 630 R Axis:   45 Text Interpretation: Atrial fibrillation Ventricular premature complex Nonspecific intraventricular conduction delay Abnrm T, consider ischemia, anterolateral lds Confirmed by Gerlene Fee [phone]) on 06/10/2020 5:02:16 PM      Labs Reviewed  CBC - Abnormal; Notable for the following components:      Result Value   Platelets 106 (*)    All other components within normal limits  URINALYSIS, ROUTINE W REFLEX MICROSCOPIC - Abnormal;  Notable for the following components:   APPearance CLOUDY (*)    pH 9.0 (*)    All other components within normal limits  COMPREHENSIVE METABOLIC PANEL - Abnormal; Notable for the following components:   Glucose, Bld 119 (*)    All other components within normal limits  BLOOD GAS, VENOUS - Abnormal; Notable for the following components:   pCO2, Ven 60.3 (*)    pO2, Ven <31.0 (*)    Acid-Base Excess 7.1 (*)    All other components within normal limits  SARS CORONAVIRUS 2 BY RT PCR (HOSPITAL ORDER, [place] [facility])  AMMONIA  HEMOGLOBIN A1C  LIPID PANEL  TROPONIN I (HIGH SENSITIVITY)    DG Chest Port 1 View  Final Result    CT HEAD WO CONTRAST  Final Result    MR BRAIN WO CONTRAST    (Results Pending)    Medications  vancomycin (VANCOCIN) 125 MG capsule 125 mg (has no administration in time range)  metoprolol succinate (TOPROL-XL) 24 hr tablet 25 mg (has no administration in time range)  saccharomyces boulardii (FLORASTOR) capsule 250 mg (has no administration in time range)  Rivaroxaban (XARELTO) tablet 15 mg (has no administration in time range)  loratadine (CLARITIN) tablet 10 mg (has no administration in time range)   stroke: mapping our early stages of recovery book (has no administration in time range)  0.9 %  sodium chloride infusion (has no administration in time range)  acetaminophen (TYLENOL) tablet 650 mg (has no administration in time range)    Or  acetaminophen (TYLENOL) 160 MG/5ML solution 650 mg (has no administration in time range)    Or  acetaminophen (TYLENOL) suppository 650 mg (has no administration in time range)  simvastatin (ZOCOR) tablet 40 mg (has no administration in time range)  sodium chloride 0.9 % bolus 500 mL (0 mLs Intravenous Stopped 06/10/20 1154)     Procedures  /  Critical Care Procedures  ED Course and Medical Decision Making  I have reviewed the triage vital signs, the nursing notes, and pertinent available records  from the EMR.  Listed above are laboratory and imaging tests that I personally ordered, reviewed, and interpreted and then considered in my medical decision making (see below for details).     Altered mental status, weakness, unable to get out of bed per family.  Vital signs are reassuring, work-up is largely unremarkable.  Patient continues to be off baseline and unable to walk, admitted to hospital service for further care.  Barth Kirks. Sedonia Small, [place] [email or web address]  Final Clinical Impressions(s) / ED Diagnoses     ICD-10-CM   1. Altered  mental status, unspecified altered mental status type  R41.82     ED Discharge Orders    None       Discharge Instructions Discussed with and Provided to Patient:   Discharge Instructions   None       Maudie Flakes, MD 06/10/20 [phone]

## 2020-06-10 NOTE — ED Notes (Signed)
Family would like to remain updated with any Kathryn Marquez, son, [phone].

## 2020-06-10 NOTE — H&P (Addendum)
History and Physical    [name] SEG:[id number] DOB: 1921/02/22 DOA: 06/10/2020  PCP: Redmond School, MD   Patient coming from: Home  Chief Complaint: AMS  HPI: Kathryn Marquez is a 86 y.o. female with medical history significant for chronic atrial fibrillation on anticoagulation with Xarelto, recent C. difficile colitis, hypertension, dyslipidemia, and prior CVA who was brought to the ED on account of worsening confusion as well as lethargy noted this morning by her daughter.  She was apparently fine yesterday evening and was able to eat and drink quite well with no significant issues.  She awakened and was only oriented to her name and was apparently not stating words correctly or making any sense otherwise.  She had 2 loose bowel movements as well and she is still undergoing treatment of her C. difficile colitis with vancomycin taper and is currently taking every other day vancomycin along with Florastor.  No significant weakness of the upper or lower extremities otherwise noted.  Patient has been having a significant frontal headache since this morning as well.  She has apparently been taking her medications as otherwise prescribed.   ED Course: Vital signs stable and patient is afebrile.  Blood pressures are noted to be elevated at bedside with systolic in the 737T and diastolics in the 062I.  She is still quite confused and complaining of headache.  Daughter is at bedside to give history.  CT of the head with no acute disturbances noted with prior left-sided CVA regions noted.  Urine analysis appears cloudy, but with no acute findings of UTI.  EKG with atrial fibrillation at 85 bpm noted.  Chest x-ray with no acute findings.  Covid swab performed and noted to be negative.  Review of Systems: Cannot be adequately obtained given patient condition.  Past Medical History:  Diagnosis Date  . Acute embolic stroke ([place]) 9/48/5462  . Dyslipidemia, goal LDL below 100 04/30/2012  . Dysrhythmia     Chronic atrial fib  . Hypertension     History reviewed. No pertinent surgical history.   reports that she has never smoked. She has never used smokeless tobacco. She reports that she does not drink alcohol and does not use drugs.  No Known Allergies  Family History  Problem Relation Age of Onset  . Stroke Other   . Cancer Other   . Heart attack Mother   . Leukemia Sister   . Cancer Brother     Prior to Admission medications   Medication Sig Start Date End Date Taking? Authorizing Provider  aspirin 81 MG chewable tablet Chew 2 tablets (162 mg total) by mouth every morning. Patient taking differently: Chew 81 mg by mouth every evening.  04/30/12   Rexene Alberts, MD  cetirizine (ZYRTEC) 10 MG tablet Take 10 mg by mouth daily.    [provider]  hydrocortisone (ANUSOL-HC) 25 MG suppository Place 25 mg rectally daily as needed. Patient not taking: Reported on 05/08/2020 04/19/20   [provider]  metoprolol succinate (TOPROL-XL) 25 MG 24 hr tablet Take 1 tablet (25 mg total) by mouth daily. New blood pressure medicine. 04/30/12 05/08/20  Rexene Alberts, MD  potassium chloride (KLOR-CON) 10 MEQ tablet Take 1 tablet (10 mEq total) by mouth daily for 5 days. 05/11/20 05/16/20  Manuella Ghazi, Tanuj Mullens D, DO  saccharomyces boulardii (FLORASTOR) 250 MG capsule Take 1 capsule (250 mg total) by mouth 2 (two) times daily. 05/11/20 06/10/20  Manuella Ghazi, Daniele Yankowski D, DO  vancomycin (VANCOCIN) 125 MG capsule Take 1  capsule (125 mg total) by mouth 4 (four) times daily for 14 days, THEN 1 capsule (125 mg total) 2 (two) times daily for 7 days, THEN 1 capsule (125 mg total) daily for 7 days, THEN 1 capsule (125 mg total) every other day for 28 days. 05/11/20 07/06/20  Manuella Ghazi, Elihue Ebert D, DO  XARELTO 15 MG TABS tablet Take 15 mg by mouth daily. 12/14/19   [provider]    Physical Exam: Vitals:   06/10/20 1106 06/10/20 1107 06/10/20 1154 06/10/20 1232  BP: (!) 122/111  (!) 157/84 (!) 158/92  Pulse: 79  85    Resp: 20  18 19   Temp: (!) 97.2 F (36.2 C) (!) 97.2 F (36.2 C)    TempSrc: Rectal Rectal    SpO2: 99%  98%   Weight:      Height:        Constitutional: NAD, calm, comfortable Vitals:   06/10/20 1106 06/10/20 1107 06/10/20 1154 06/10/20 1232  BP: (!) 122/111  (!) 157/84 (!) 158/92  Pulse: 79  85   Resp: 20  18 19   Temp: (!) 97.2 F (36.2 C) (!) 97.2 F (36.2 C)    TempSrc: Rectal Rectal    SpO2: 99%  98%   Weight:      Height:       Eyes: lids and conjunctivae normal ENMT: Mucous membranes are moist.  Neck: normal, supple Respiratory: clear to auscultation bilaterally. Normal respiratory effort. No accessory muscle use.  Cardiovascular: Irregular rate and rhythm, no murmurs. No extremity edema. Abdomen: no tenderness, no distention. Bowel sounds positive.  Musculoskeletal:  No joint deformity upper and lower extremities.   Skin: no rashes, lesions, ulcers.  Psychiatric: Flat affect.  Alert and cannot definitively state name, place, or time.  Labs on Admission: I have personally reviewed following labs and imaging studies  CBC: Recent Labs  Lab 06/10/20 1104  WBC 5.9  HGB 13.9  HCT 43.8  MCV 93.6  PLT 782*   Basic Metabolic Panel: Recent Labs  Lab 06/10/20 1259  NA 137  K 3.9  CL 98  CO2 27  GLUCOSE 119*  BUN 9  CREATININE 0.59  CALCIUM 9.2   GFR: Estimated Creatinine Clearance: 34 mL/min (by C-G formula based on SCr of 0.59 mg/dL). Liver Function Tests: Recent Labs  Lab 06/10/20 1259  AST 34  ALT 26  ALKPHOS 73  BILITOT 1.0  PROT 6.5  ALBUMIN 3.9   No results for input(s): LIPASE, AMYLASE in the last 168 hours. No results for input(s): AMMONIA in the last 168 hours. Coagulation Profile: No results for input(s): INR, PROTIME in the last 168 hours. Cardiac Enzymes: No results for input(s): CKTOTAL, CKMB, CKMBINDEX, TROPONINI in the last 168 hours. BNP (last 3 results) No results for input(s): PROBNP in the last 8760 hours. HbA1C: No  results for input(s): HGBA1C in the last 72 hours. CBG: No results for input(s): GLUCAP in the last 168 hours. Lipid Profile: No results for input(s): CHOL, HDL, LDLCALC, TRIG, CHOLHDL, LDLDIRECT in the last 72 hours. Thyroid Function Tests: No results for input(s): TSH, T4TOTAL, FREET4, T3FREE, THYROIDAB in the last 72 hours. Anemia Panel: No results for input(s): VITAMINB12, FOLATE, FERRITIN, TIBC, IRON, RETICCTPCT in the last 72 hours. Urine analysis:    Component Value Date/Time   COLORURINE YELLOW 06/10/2020 1041   APPEARANCEUR CLOUDY (A) 06/10/2020 1041   LABSPEC 1.009 06/10/2020 1041   PHURINE 9.0 (H) 06/10/2020 1041   [place] 06/10/2020 1041  HGBUR NEGATIVE 06/10/2020 [place] 06/10/2020 [place] 06/10/2020 1041   PROTEINUR NEGATIVE 06/10/2020 1041   UROBILINOGEN 0.2 04/28/2012 2308   NITRITE NEGATIVE 06/10/2020 1041   LEUKOCYTESUR NEGATIVE 06/10/2020 1041    Radiological Exams on Admission: CT HEAD WO CONTRAST  Result Date: 06/10/2020 CLINICAL DATA:  Delirium EXAM: CT HEAD WITHOUT CONTRAST TECHNIQUE: Contiguous axial images were obtained from the base of the skull through the vertex without intravenous contrast. COMPARISON:  February 05, 2016 FINDINGS: Brain: No evidence of acute infarction, hemorrhage, hydrocephalus, extra-axial collection or mass lesion/mass effect. Signs of prior infarct of the LEFT posterior frontal region similar to the study of 2017. Atrophy and white matter disease also similar. Similar appearance also in the LEFT occipital region. Signs of prior infarct in this area as seen on prior imaging. Vascular: No hyperdense vessel or unexpected calcification. Skull: Normal. Negative for fracture or focal lesion. Sinuses/Orbits: Visualized paranasal sinuses and orbits are unremarkable. Other: None IMPRESSION: 1. No acute intracranial pathology. 2. Signs of prior infarct of the LEFT posterior frontal region and LEFT  parieto-occipital region similar to the study of 2017. 3. Atrophy and white matter disease. Electronically Signed   By: Zetta Bills M.D.   On: 06/10/2020 11:39   DG Chest Port 1 View  Result Date: 06/10/2020 CLINICAL DATA:  Patient brought in via EMS from home. Alert. Patient oriented to person but disoriented to place, situation, and time. Patient being brought in for altered mental status. Per paramedic patient was last seen at her normal yesterday at 6pm when eating supper and noticed to have altered mental status this morning at 7am. EXAM: PORTABLE CHEST 1 VIEW COMPARISON:  04/13/2020 FINDINGS: Cardiac silhouette is enlarged but stable. No mediastinal or hilar masses. Densely calcified pleural plaques along the left mid to lower hemithorax, stable. No evidence of pneumonia or pulmonary edema. No pleural effusion or pneumothorax. Skeletal structures are demineralized but grossly intact. IMPRESSION: 1. No acute cardiopulmonary disease. Stable appearance from the prior study Electronically Signed   By: Lajean Manes M.D.   On: 06/10/2020 11:24    EKG: Independently reviewed. Afib 85bpm.  Assessment/Plan Active Problems:   Acute metabolic encephalopathy    Acute encephalopathy -Possibly secondary to new TIA/CVA with noted prior history -Transfer to [facility] for further evaluation with brain MRI -Further evaluation and neurology consultation based on MRI result -No sign of UTI or other infection currently noted aside from ongoing C. difficile colitis -Ammonia level is within normal limits at 9 -Venous blood gas with borderline elevation in pCO2, unlikely cause  Ongoing C. difficile colitis -Continue on vancomycin taper dose as scheduled with vancomycin every other day for the next 28 days -Continue enteric precautions -Follow up with GI as scheduled outpatient  Thrombocytopenia -Appears to be an issue since 6/21 -Hold aspirin for now and patient is on Xarelto -Continue to monitor on  CBC  History of chronic atrial fibrillation -Currently rate controlled -Continue Xarelto for anticoagulation -Continue metoprolol for rate control -Monitor on telemetry  History of prior CVA -Hold aspirin for now given thrombocytopenia and patient is on Xarelto -Statin ordered  History of hypertension -Allow permissive hypertension for the time being -Continue metoprolol for HR control  History of dyslipidemia -Statin ordered -Check lipid panel   DVT prophylaxis: Xarelto Code Status: DNR Family Communication: Daughter, Enid Derry at bedside Disposition Plan:Transfer to [facility] for further evaluation with MRI Consults called:None Admission status: Inpatient, Tele   Lon Klippel D Vamsi Apfel DO  [facility]  If 7PM-7AM, please contact night-coverage [email or web address]  06/10/2020, 2:36 PM

## 2020-06-11 ENCOUNTER — Inpatient Hospital Stay (HOSPITAL_COMMUNITY): Payer: PPO

## 2020-06-11 DIAGNOSIS — A0471 Enterocolitis due to Clostridium difficile, recurrent: Secondary | ICD-10-CM

## 2020-06-11 MED ORDER — LORAZEPAM 2 MG/ML IJ SOLN
0.5000 mg | Freq: Once | INTRAMUSCULAR | Status: DC
  Filled 2020-06-11: qty 1

## 2020-06-11 MED ORDER — FIDAXOMICIN 200 MG PO TABS
200.0000 mg | ORAL_TABLET | Freq: Two times a day (BID) | ORAL | Status: DC
  Administered 2020-06-11 – 2020-06-15 (×9): 200 mg via ORAL
  Filled 2020-06-11 (×11): qty 1

## 2020-06-11 NOTE — Plan of Care (Signed)
  Problem: Education: Goal: Knowledge of General Education information will improve Description: Including pain rating scale, medication(s)/side effects and non-pharmacologic comfort measures Outcome: Progressing   Problem: Health Behavior/Discharge Planning: Goal: Ability to manage health-related needs will improve Outcome: Progressing   Problem: Clinical Measurements: Goal: Ability to maintain clinical measurements within normal limits will improve Outcome: Progressing Goal: Will remain free from infection Outcome: Progressing Goal: Diagnostic test results will improve Outcome: Progressing Goal: Respiratory complications will improve Outcome: Progressing Goal: Cardiovascular complication will be avoided Outcome: Progressing   Problem: Activity: Goal: Risk for activity intolerance will decrease Outcome: Progressing   Problem: Nutrition: Goal: Adequate nutrition will be maintained Outcome: Progressing   Problem: Coping: Goal: Level of anxiety will decrease Outcome: Progressing   Problem: Elimination: Goal: Will not experience complications related to bowel motility Outcome: Progressing Goal: Will not experience complications related to urinary retention Outcome: Progressing   Problem: Pain Managment: Goal: General experience of comfort will improve Outcome: Progressing   Problem: Safety: Goal: Ability to remain free from injury will improve Outcome: Progressing   Problem: Skin Integrity: Goal: Risk for impaired skin integrity will decrease Outcome: Progressing   Problem: Education: Goal: Knowledge of disease or condition will improve Outcome: Progressing Goal: Knowledge of secondary prevention will improve Outcome: Progressing Goal: Knowledge of patient specific risk factors addressed and post discharge goals established will improve Outcome: Progressing Goal: Individualized Educational Video(s) Outcome: Progressing   Problem: Health Behavior/Discharge  Planning: Goal: Ability to manage health-related needs will improve Outcome: Progressing   Problem: Self-Care: Goal: Ability to participate in self-care as condition permits will improve Outcome: Progressing Goal: Ability to communicate needs accurately will improve Outcome: Progressing   Problem: Nutrition: Goal: Risk of aspiration will decrease Outcome: Progressing   Problem: Ischemic Stroke/TIA Tissue Perfusion: Goal: Complications of ischemic stroke/TIA will be minimized Outcome: Progressing

## 2020-06-11 NOTE — Progress Notes (Signed)
Pt confused, attempting to get OOB, pulled out PIV, and pulling off telemetry leads. [place], NP informed. New order placed.

## 2020-06-11 NOTE — Evaluation (Signed)
Physical Therapy Evaluation Patient Details Name: Kathryn Marquez MRN: [id number] DOB: 1921-04-20 Today's Date: 06/11/2020   History of Present Illness  Kathryn Marquez is a 86 y.o. female with medical history significant for chronic atrial fibrillation on anticoagulation with Xarelto, recent C. difficile colitis, hypertension, dyslipidemia, and prior CVA who was brought to the ED on account of worsening confusion as well as lethargy noted this morning (7/31) by her daughter. MRI:No acute intracranial infarct or other abnormality, scattered areas of encephalomalacia involving the left frontal,parietal and occipital regions, compatible with chronic ischemicchanges, watershed in distribution  Clinical Impression  Pt admitted secondary to problem above with deficits below. Pt requiring mod A +2 to stand and take steps to recliner this session. Pt also continues to be confused and present with cognitive deficits. Unsure of baseline function as no family present. Feel pt would benefit from SNF level therapies, given previous note reports she was independent with use of cane. Will continue to follow acutely to maximize functional mobility independence and safety.     Follow Up Recommendations SNF;Supervision/Assistance - 24 hour (unless family able to provide necessary support)    Equipment Recommendations  Rolling walker with 5" wheels    Recommendations for Other Services       Precautions / Restrictions Precautions Precautions: Fall Restrictions Weight Bearing Restrictions: No      Mobility  Bed Mobility Overal bed mobility: Needs Assistance Bed Mobility: Supine to Sit     Supine to sit: Mod assist     General bed mobility comments: Mod A for assist with scooting to EOB and for trunk assist. Pt initially with posterior lean in sitting.   Transfers Overall transfer level: Needs assistance Equipment used: 2 person hand held assist Transfers: Sit to/from Stand Sit to Stand: Mod  assist;+2 physical assistance         General transfer comment: Mod A +2 for lift assist and steadying.   Ambulation/Gait Ambulation/Gait assistance: Mod assist;+2 physical assistance Gait Distance (Feet): 2 Feet Assistive device: 2 person hand held assist Gait Pattern/deviations: Step-through pattern;Decreased stride length;Narrow base of support Gait velocity: Decreased   General Gait Details: Pt with difficulty sequencing to take steps to recliner. Very narrow BOS as well. Required BUE HHA and mod A +2.   Stairs            Wheelchair Mobility    Modified Rankin (Stroke Patients Only)       Balance Overall balance assessment: Needs assistance Sitting-balance support: Feet supported;Bilateral upper extremity supported Sitting balance-Leahy Scale: Poor Sitting balance - Comments: Reliant on UE support and min guard to min A to maintain balance   Standing balance support: Bilateral upper extremity supported Standing balance-Leahy Scale: Poor Standing balance comment: Reliant on BUE and mod A +2.                              Pertinent Vitals/Pain Pain Assessment: No/denies pain    Home Living Family/patient expects to be discharged to:: Private residence Living Arrangements: Children Available Help at Discharge: Family;Available 24 hours/day Type of Home: House Home Access: Stairs to enter   [employer] of Steps: 1 step Home Layout: Laundry or work area in basement;Two level;Able to live on main level with bedroom/bathroom Home Equipment: Kasandra Knudsen - single point Additional Comments: Obtained info from previous notes as pt unable to accurately report.     Prior Function Level of Independence: Independent with assistive device(s)  Comments: Per previous notes, was using cane for ambulation, independent in self care     Hand Dominance        Extremity/Trunk Assessment   Upper Extremity Assessment Upper Extremity Assessment:  Defer to OT evaluation    Lower Extremity Assessment Lower Extremity Assessment: Generalized weakness    Cervical / Trunk Assessment Cervical / Trunk Assessment: Kyphotic  Communication   Communication: No difficulties  Cognition Arousal/Alertness: Awake/alert Behavior During Therapy: WFL for tasks assessed/performed Overall Cognitive Status: No family/caregiver present to determine baseline cognitive functioning                                 General Comments: Pt unable to state her birth month, but knew the year, did not know why she was in the hospital and did not know the current date. Pt kept repeating she was in the hospital. Reports "I'm glad you came to visit".       General Comments General comments (skin integrity, edema, etc.): No family present     Exercises     Assessment/Plan    PT Assessment Patient needs continued PT services  PT Problem List Decreased strength;Decreased balance;Decreased mobility;Decreased activity tolerance;Decreased cognition;Decreased knowledge of use of DME;Decreased safety awareness;Decreased knowledge of precautions;Decreased coordination       PT Treatment Interventions Gait training;DME instruction;Therapeutic activities;Functional mobility training;Therapeutic exercise;Balance training;Neuromuscular re-education;Patient/family education;Cognitive remediation    PT Goals (Current goals can be found in the Care Plan section)  Acute Rehab PT Goals PT Goal Formulation: Patient unable to participate in goal setting Time For Goal Achievement: 06/25/20 Potential to Achieve Goals: Good    Frequency Min 2X/week   Barriers to discharge        Co-evaluation PT/OT/SLP Co-Evaluation/Treatment: Yes Reason for Co-Treatment: Necessary to address cognition/behavior during functional activity;For patient/therapist safety;To address functional/ADL transfers           AM-PAC PT "6 Clicks" Mobility  Outcome Measure Help  needed turning from your back to your side while in a flat bed without using bedrails?: A Lot Help needed moving from lying on your back to sitting on the side of a flat bed without using bedrails?: A Lot Help needed moving to and from a bed to a chair (including a wheelchair)?: A Lot Help needed standing up from a chair using your arms (e.g., wheelchair or bedside chair)?: A Lot Help needed to walk in hospital room?: A Lot Help needed climbing 3-5 steps with a railing? : Total 6 Click Score: 11    End of Session Equipment Utilized During Treatment: Gait belt Activity Tolerance: Patient tolerated treatment well Patient left: in chair;with call bell/phone within reach;with chair alarm set (in mits) Nurse Communication: Mobility status PT Visit Diagnosis: Unsteadiness on feet (R26.81);Muscle weakness (generalized) (M62.81)    Time: 7793-9030 PT Time Calculation (min) (ACUTE ONLY): 20 min   Charges:   PT Evaluation $PT Eval Moderate Complexity: 1 Mod          Reuel Derby, PT, DPT  Acute Rehabilitation Services  Pager: [phone] Office: [phone]   Rudean Hitt 06/11/2020, 10:45 AM

## 2020-06-11 NOTE — Progress Notes (Signed)
PROGRESS NOTE    Kathryn Marquez  DPO:[id number] DOB: 01-21-1921 DOA: 06/10/2020 PCP: Redmond School, MD   Brief Narrative:  Kathryn Marquez is a 86 y.o. female with medical history significant for chronic atrial fibrillation on anticoagulation with Xarelto, recent C. difficile colitis, hypertension, dyslipidemia, and prior CVA who was brought to the ED on account of worsening confusion as well as lethargy noted this morning by her daughter.  She was apparently fine yesterday evening and was able to eat and drink quite well with no significant issues.  She awakened and was only oriented to her name and was apparently not stating words correctly or making any sense otherwise.  She had 2 loose bowel movements as well and she is still undergoing treatment of her C. difficile colitis with vancomycin taper and is currently taking every other day vancomycin along with Florastor.  No significant weakness of the upper or lower extremities otherwise noted.  Patient has been having a significant frontal headache since this morning as well.  She has apparently been taking her medications as otherwise prescribed. Vital signs stable and patient is afebrile.  Blood pressures are noted to be elevated at bedside with systolic in the 431V and diastolics in the 400Q.  She is still quite confused and complaining of headache.  Daughter is at bedside to give history.  CT of the head with no acute disturbances noted with prior left-sided CVA regions noted.  Urine analysis appears cloudy, but with no acute findings of UTI.  EKG with atrial fibrillation at 85 bpm noted.  Chest x-ray with no acute findings.  Covid swab performed and noted to be negative.  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 difficile as below, POA -Likely infectious given C. difficile as below -Imaging and work-up for CVA versus TIA so far unremarkable, patient is now back to baseline per family after IV fluids and supportive care -Ammonia level is within normal limits at 9 -Venous blood gas with borderline elevation in pCO2, unlikely cause  Recurrent/worsening C. difficile colitis, POA - Discussed patient's case with ID given what appears to be acute worsening diarrhea while attempting to taper patient off vancomycin to every other day dosing with "numerous watery bowel movements over the past 48 hours" per family  - Will transition to Dificid 06/11/2020 per discussion with Dr. Baxter Flattery - Continue enteric precautions - Follow up with GI as scheduled outpatient, no acute indication for endoscopy or consult  Thrombocytopenia, possibly acute phase reactant given above - Appears to be an issue since 6/21 - Hold aspirin for now and patient is on Xarelto - Continue to monitor on CBC  History of chronic atrial fibrillation -Currently rate controlled -Continue Xarelto for anticoagulation -Continue metoprolol for rate control -Monitor on telemetry  History of prior CVA -Hold aspirin for now given thrombocytopenia and patient is on Xarelto -Statin ordered  History of hypertension -Allow permissive hypertension for the time being -Continue metoprolol for HR control  History of dyslipidemia -Statin ordered -Check lipid panel   DVT prophylaxis: Xarelto Code Status: DNR Family Communication: Discussed with patient's son and daughter over the phone  Status is:  Inpatient  Dispo: The patient is from: Home              Anticipated d/c is to: To be determined              Anticipated d/c date is: 48 to 72 hours              Patient currently not medically stable for discharge due to  ongoing need for IV fluids, new medication in the setting of worsening recurrent C. difficile and altered mental status.  Consultants:   Infectious disease  Procedures:   None indicated  Antimicrobials:  Discontinue oral vancomycin, start Dificid 06/11/2020  Subjective: No acute issues or events overnight, patient much more awake alert and oriented this morning per family.  Denies nausea, vomiting, diarrhea, constipation, headache, fevers, chills.  Patient notes diarrhea prior to hospitalization but overnight and early this morning only 1 semi-normal bowel movement per patient.  Objective: Vitals:   06/11/20 0005 06/11/20 0356 06/11/20 0858 06/11/20 1140  BP: 125/77 112/81 (!) 136/73 (!) 161/73  Pulse: 67 67 64 74  Resp: 23 17 20 20   Temp: 98.3 F (36.8 C) (!) 97.4 F (36.3 C) 98.1 F (36.7 C) 97.8 F (36.6 C)  TempSrc: Oral Oral Oral Oral  SpO2: 97% 98% 100% 100%  Weight:      Height:        Intake/Output Summary (Last 24 hours) at 06/11/2020 1459 Last data filed at 06/11/2020 0305 Gross per 24 hour  Intake 611.05 ml  Output --  Net 611.05 ml   Filed Weights   06/10/20 1033  Weight: 59 kg    Examination:  General:  Pleasantly resting in bed, No acute distress. HEENT:  Normocephalic atraumatic.  Sclerae nonicteric, noninjected.  Extraocular movements intact bilaterally. Neck:  Without mass or deformity.  Trachea is midline. Lungs:  Clear to auscultate bilaterally without rhonchi, wheeze, or rales. Heart:  Regular rate and rhythm.  Without murmurs, rubs, or gallops. Abdomen:  Soft, nontender, nondistended.  Without guarding or rebound. Extremities: Without cyanosis, clubbing, edema, or obvious deformity. Vascular:  Dorsalis pedis and posterior tibial pulses palpable bilaterally. Skin:  Warm and dry, no erythema, no ulcerations.   Data Reviewed: I have personally reviewed following labs and imaging studies  CBC: Recent Labs  Lab 06/10/20 1104  WBC 5.9  HGB  13.9  HCT 43.8  MCV 93.6  PLT 161*   Basic Metabolic Panel: Recent Labs  Lab 06/10/20 1259  NA 137  K 3.9  CL 98  CO2 27  GLUCOSE 119*  BUN 9  CREATININE 0.59  CALCIUM 9.2   GFR: Estimated Creatinine Clearance: 34 mL/min (by C-G formula based on SCr of 0.59 mg/dL). Liver Function Tests: Recent Labs  Lab 06/10/20 1259  AST 34  ALT 26  ALKPHOS 73  BILITOT 1.0  PROT 6.5  ALBUMIN 3.9   No results for input(s): LIPASE, AMYLASE in the last 168 hours. Recent Labs  Lab 06/10/20 1531  AMMONIA 9   Coagulation Profile: No results for input(s): INR, PROTIME in the last 168 hours. Cardiac Enzymes: No results for input(s): CKTOTAL, CKMB, CKMBINDEX, TROPONINI in the last 168 hours. BNP (last 3 results) No results for input(s): PROBNP in the last 8760 hours. HbA1C: No results for input(s): HGBA1C in the last 72 hours. CBG: No results for input(s): GLUCAP in the last 168 hours. Lipid Profile: No results for input(s): CHOL, HDL, LDLCALC, TRIG, CHOLHDL, LDLDIRECT in the last 72 hours. Thyroid Function  Tests: No results for input(s): TSH, T4TOTAL, FREET4, T3FREE, THYROIDAB in the last 72 hours. Anemia Panel: No results for input(s): VITAMINB12, FOLATE, FERRITIN, TIBC, IRON, RETICCTPCT in the last 72 hours. Sepsis Labs: No results for input(s): PROCALCITON, LATICACIDVEN in the last 168 hours.  Recent Results (from the past 240 hour(s))  SARS Coronavirus 2 by RT PCR (hospital order, performed in [facility] hospital lab) Nasopharyngeal Nasopharyngeal Swab     Status: None   Collection Time: 06/10/20  2:02 PM   Specimen: Nasopharyngeal Swab  Result Value Ref Range Status   SARS Coronavirus 2 NEGATIVE NEGATIVE Final    Comment: (NOTE) SARS-CoV-2 target nucleic acids are NOT DETECTED.  The SARS-CoV-2 RNA is generally detectable in upper and lower respiratory specimens during the acute phase of infection. The lowest concentration of SARS-CoV-2 viral copies this assay can  detect is 250 copies / mL. A negative result does not preclude SARS-CoV-2 infection and should not be used as the sole basis for treatment or other patient management decisions.  A negative result may occur with improper specimen collection / handling, submission of specimen other than nasopharyngeal swab, presence of viral mutation(s) within the areas targeted by this assay, and inadequate number of viral copies (<250 copies / mL). A negative result must be combined with clinical observations, patient history, and epidemiological information.  Fact Sheet for Patients:   StrictlyIdeas.no  Fact Sheet for Healthcare Providers: BankingDealers.co.za  This test is not yet approved or  cleared by the [place] FDA and has been authorized for detection and/or diagnosis of SARS-CoV-2 by FDA under an Emergency Use Authorization (EUA).  This EUA will remain in effect (meaning this test can be used) for the duration of the COVID-19 declaration under Section 564(b)(1) of the Act, 21 U.S.C. section 360bbb-3(b)(1), unless the authorization is terminated or revoked sooner.  Performed at [facility], [address] [place], [place] [address]          Radiology Studies: CT HEAD WO CONTRAST  Result Date: 06/10/2020 CLINICAL DATA:  Delirium EXAM: CT HEAD WITHOUT CONTRAST TECHNIQUE: Contiguous axial images were obtained from the base of the skull through the vertex without intravenous contrast. COMPARISON:  February 05, 2016 FINDINGS: Brain: No evidence of acute infarction, hemorrhage, hydrocephalus, extra-axial collection or mass lesion/mass effect. Signs of prior infarct of the LEFT posterior frontal region similar to the study of 2017. Atrophy and white matter disease also similar. Similar appearance also in the LEFT occipital region. Signs of prior infarct in this area as seen on prior imaging. Vascular: No hyperdense vessel or unexpected  calcification. Skull: Normal. Negative for fracture or focal lesion. Sinuses/Orbits: Visualized paranasal sinuses and orbits are unremarkable. Other: None IMPRESSION: 1. No acute intracranial pathology. 2. Signs of prior infarct of the LEFT posterior frontal region and LEFT parieto-occipital region similar to the study of 2017. 3. Atrophy and white matter disease. Electronically Signed   By: Zetta Bills M.D.   On: 06/10/2020 11:39   MR BRAIN WO CONTRAST  Result Date: 06/11/2020 CLINICAL DATA:  Initial evaluation for neuro deficit, stroke suspected, altered mental status. EXAM: MRI HEAD WITHOUT CONTRAST TECHNIQUE: Multiplanar, multiecho pulse sequences of the brain and surrounding structures were obtained without intravenous contrast. COMPARISON:  Prior CT from 06/10/2020. FINDINGS: Brain: Examination technically limited as the patient was unable to tolerate the full length of the exam. Diffusion-weighted imaging as well as axial FLAIR and gradient echo sequences only were performed. Additionally, images are somewhat degraded by motion artifact.  Generalized age-related cerebral atrophy. Patchy T2/FLAIR hyperintensity within the periventricular white matter most consistent with chronic small vessel ischemic disease, mild for age. Scattered areas of encephalomalacia gliosis involving the left frontal parietal and occipital regions compatible with chronic ischemic changes, somewhat watershed in distribution. Minimal associated chronic hemosiderin staining. No abnormal foci of restricted diffusion to suggest acute or subacute ischemia. Gray-white matter differentiation otherwise maintained. No evidence for acute intracranial hemorrhage. No mass lesion, midline shift or mass effect. No hydrocephalus or extra-axial fluid collection. Vascular: Normal vascular flow void seen throughout the anterior the intracranial circulation. Posterior circulation not well seen, likely diminutive/hypoplastic. Skull and upper cervical  spine: Grossly unremarkable on this limited exam. Sinuses/Orbits: Globes and orbital soft tissues grossly within normal limits. Paranasal sinuses are largely clear. No significant mastoid effusion. Other: None. IMPRESSION: 1. Technically limited exam due to the patient's inability to tolerate the full length of the study. 2. No acute intracranial infarct or other abnormality. 3. Scattered areas of encephalomalacia involving the left frontal, parietal and occipital regions, compatible with chronic ischemic changes, watershed in distribution. 4. Underlying age-related cerebral atrophy with mild chronic small vessel ischemic disease. Electronically Signed   By: Jeannine Boga M.D.   On: 06/11/2020 01:58   DG Chest Port 1 View  Result Date: 06/10/2020 CLINICAL DATA:  Patient brought in via EMS from home. Alert. Patient oriented to person but disoriented to place, situation, and time. Patient being brought in for altered mental status. Per paramedic patient was last seen at her normal yesterday at 6pm when eating supper and noticed to have altered mental status this morning at 7am. EXAM: PORTABLE CHEST 1 VIEW COMPARISON:  04/13/2020 FINDINGS: Cardiac silhouette is enlarged but stable. No mediastinal or hilar masses. Densely calcified pleural plaques along the left mid to lower hemithorax, stable. No evidence of pneumonia or pulmonary edema. No pleural effusion or pneumothorax. Skeletal structures are demineralized but grossly intact. IMPRESSION: 1. No acute cardiopulmonary disease. Stable appearance from the prior study Electronically Signed   By: Lajean Manes M.D.   On: 06/10/2020 11:24   Scheduled Meds:  fidaxomicin  200 mg Oral BID   loratadine  10 mg Oral Daily   LORazepam  0.5 mg Intravenous Once   metoprolol succinate  25 mg Oral Daily   Rivaroxaban  15 mg Oral Daily   saccharomyces boulardii  250 mg Oral BID   simvastatin  40 mg Oral q1800    LOS: 1 day   Time spent: 58min  Leland Raver  C Devany Aja, DO [facility]  If 7PM-7AM, please contact night-coverage [email or web address]  06/11/2020, 2:59 PM

## 2020-06-11 NOTE — TOC Initial Note (Signed)
Transition of Care [facility]) - Initial/Assessment Note    Patient Details  Name: Kathryn Marquez MRN: [id number] Date of Birth: [birth year]  Transition [facility]) CM/SW Contact:    Jacquelynn Cree Phone Number: 06/11/2020, 5:52 PM  Clinical Narrative:                 CSW met with patient, patient's daughter Enid Derry and son Marc Morgans bedside. CSW spoke with family regarding PT recommendation for SNF or 24/7 supervision. Patient's daughter stated she is with patient at all times in patient's home and would like her to return home at discharge.   Patient lives in a single story home. Prior to arrival patient was independent up until about 6 weeks ago when she started using a walker. Patient's daughter would be interested in home health, which patient had back in 2013. TOC team will continue to follow.  Expected Discharge Plan: [place] Barriers to Discharge: Continued Medical Work up   Patient Goals and CMS Choice   CMS Medicare.gov Compare Post Acute Care list provided to:: Patient Represenative (must comment) Frederik Pear) Choice offered to / list presented to : Adult Children  Expected Discharge Plan and Services Expected Discharge Plan: [place] Choice: [place] arrangements for the past 2 months: Single Family Home                                      Prior Living Arrangements/Services Living arrangements for the past 2 months: Single Family Home Lives with:: Adult Children Patient language and need for interpreter reviewed:: Yes Do you feel safe going back to the place where you live?: Yes      Need for Family Participation in Patient Care: Yes (Comment) Care giver support system in place?: Yes (comment)   Criminal Activity/Legal Involvement Pertinent to Current Situation/Hospitalization: No - Comment as needed  Activities of Daily Living      Permission Sought/Granted Permission  sought to share information with : Family Supports    Share Information with NAME: Rufus Cypert        Permission granted to share info w Contact Information: [phone]  Emotional Assessment   Attitude/Demeanor/Rapport: Unable to Assess Affect (typically observed): Unable to Assess Orientation: : Oriented to Self Alcohol / Substance Use: Not Applicable Psych Involvement: No (comment)  Admission diagnosis:  Altered mental status, unspecified altered mental status type [N62.95] Acute metabolic encephalopathy [M84.13] Patient Active Problem List   Diagnosis Date Noted  . Recurrent Clostridioides difficile diarrhea 06/11/2020  . Acute metabolic encephalopathy 24/40/1027  . Dehydration 05/09/2020  . Colitis 05/08/2020  . C. difficile colitis 04/15/2020  . Hypokalemia 04/14/2020  . Abdominal pain 04/14/2020  . Acute diverticulitis 04/13/2020  . Head injury 02/05/2016  . Fall involving sidewalk curb 02/05/2016  . Facial trauma 02/05/2016  . Facial bruising 02/05/2016  . Fall 02/05/2016  . Dyslipidemia, goal LDL below 100 04/30/2012  . TIA (transient ischemic attack) 04/29/2012  . History of embolic stroke - 2536 R hemiparesis 04/29/2012  . Hypertension 04/29/2012  . Chronic atrial fibrillation ([place]) 04/29/2012  . CLOSED COLLES FRACTURE 08/02/2010   PCP:  Redmond School, MD Pharmacy:   [facility], [place] [address] Phone: [phone] Fax: [phone]     Social Determinants of Health (  SDOH) Interventions    Readmission Risk Interventions No flowsheet data found.

## 2020-06-11 NOTE — Evaluation (Signed)
Clinical/Bedside Swallow Evaluation Patient Details  Name: Kathryn Marquez MRN: [id number] Date of Birth: Apr 05, 1921  Today's Date: 06/11/2020 Time: SLP Start Time (ACUTE ONLY): 7591 SLP Stop Time (ACUTE ONLY): 1235 SLP Time Calculation (min) (ACUTE ONLY): 20 min  Past Medical History:  Past Medical History:  Diagnosis Date  . Acute embolic stroke ([place]) 6/38/4665  . Dyslipidemia, goal LDL below 100 04/30/2012  . Dysrhythmia    Chronic atrial fib  . Hypertension    Past Surgical History: History reviewed. No pertinent surgical history. HPI:  Kathryn Marquez is a 86 y.o. female with medical history significant for chronic atrial fibrillation on anticoagulation with Xarelto, recent C. difficile colitis, hypertension, dyslipidemia, and prior CVA who was brought to the ED on account of worsening confusion as well as lethargy noted this morning (7/31) by her daughter. MRI:No acute intracranial infarct or other abnormality, scattered areas of encephalomalacia involving the left frontal,parietal and occipital regions, compatible with chronic ischemicchanges, watershed in distribution. CXR 06/10/20: No acute abnormalities.   Assessment / Plan / Recommendation Clinical Impression  Kathryn Marquez demonstrates oropharyngeal swallow WNL with no s/s aspiration or dysphagia given thin liquids via cup/straw or regular solids. Oral mech exam was unremarkable. Family present who denies any history of swallowing difficulty with the exception of large pills, which they report is ongoing for many years. They cut pills in half as needed.  Altered mental status/confusion has greatly improved over past twenty-four hours. Recommend: thin liquids, regular solids, meds as tolerated. Cognitive evaluation to be completed as schedule allows.  SLP Visit Diagnosis: Dysphagia, unspecified (R13.10)    Aspiration Risk  Mild aspiration risk    Diet Recommendation Thin liquid;Regular   Liquid Administration via:  Straw;Cup Medication Administration: Whole meds with liquid Supervision: Patient able to self feed Postural Changes: Seated upright at 90 degrees    Other  Recommendations Oral Care Recommendations: Oral care BID       Prognosis   Good     Swallow Study   General Date of Onset: 06/09/20 HPI: Kathryn Marquez is a 86 y.o. female with medical history significant for chronic atrial fibrillation on anticoagulation with Xarelto, recent C. difficile colitis, hypertension, dyslipidemia, and prior CVA who was brought to the ED on account of worsening confusion as well as lethargy noted this morning (7/31) by her daughter. MRI:No acute intracranial infarct or other abnormality, scattered areas of encephalomalacia involving the left frontal,parietal and occipital regions, compatible with chronic ischemicchanges, watershed in distribution. CXR 06/10/20: No acute abnormalities. Type of Study: Bedside Swallow Evaluation Previous Swallow Assessment: n/a Diet Prior to this Study: NPO Temperature Spikes Noted: No Respiratory Status: Room air History of Recent Intubation: No Behavior/Cognition: Alert;Cooperative;Pleasant mood Oral Cavity Assessment: Within Functional Limits Oral Care Completed by SLP: Yes Oral Cavity - Dentition: Adequate natural dentition Vision: Functional for self-feeding Self-Feeding Abilities: Able to feed self Patient Positioning: Upright in bed Baseline Vocal Quality: Normal Volitional Cough: Strong Volitional Swallow: Able to elicit    Oral/Motor/Sensory Function Overall Oral Motor/Sensory Function: Within functional limits   Ice Chips     Thin Liquid Thin Liquid: Within functional limits Presentation: Straw;Cup    Puree Puree: Within functional limits Presentation: Spoon   Solid     Solid: Within functional limits Presentation: Kathryn Marquez. Gabreille Dardis, M.S., CCC-SLP Speech-Language Pathologist Acute Rehabilitation Services Pager: [place] 06/11/2020,1:05 PM

## 2020-06-11 NOTE — Evaluation (Signed)
Occupational Therapy Evaluation Patient Details Name: Kathryn Marquez MRN: [id number] DOB: 06/20/1921 Today's Date: 06/11/2020    History of Present Illness Kathryn Marquez is a 86 y.o. female with medical history significant for chronic atrial fibrillation on anticoagulation with Xarelto, recent C. difficile colitis, hypertension, dyslipidemia, and prior CVA who was brought to the ED on account of worsening confusion as well as lethargy noted this morning (7/31) by her daughter. MRI:No acute intracranial infarct or other abnormality, scattered areas of encephalomalacia involving the left frontal,parietal and occipital regions, compatible with chronic ischemicchanges, watershed in distribution   Clinical Impression   This 86 yo female admitted with above presents to acute OT with PLOF per chart of her being able to ambulate with a SPC and do most of her basic ADLs. Currently pt is Mod-Mod A +2 for mobility and min A-total A for basic ADLs. She will benefit from acute OT with follow up at SNF.    Follow Up Recommendations  SNF;Supervision/Assistance - 24 hour    Equipment Recommendations  Other (comment) (TBD next venue)       Precautions / Restrictions Precautions Precautions: Fall Restrictions Weight Bearing Restrictions: No      Mobility Bed Mobility Overal bed mobility: Needs Assistance Bed Mobility: Supine to Sit     Supine to sit: Mod assist     General bed mobility comments: Mod A for assist with scooting to EOB and for trunk assist. Pt initially with posterior lean in sitting.   Transfers Overall transfer level: Needs assistance Equipment used: 2 person hand held assist Transfers: Sit to/from Stand Sit to Stand: Mod assist;+2 physical assistance         General transfer comment: Mod A +2 for lift assist and steadying.     Balance Overall balance assessment: Needs assistance Sitting-balance support: Feet supported;Bilateral upper extremity supported Sitting  balance-Leahy Scale: Poor Sitting balance - Comments: Reliant on UE support and min guard to min A to maintain balance   Standing balance support: Bilateral upper extremity supported Standing balance-Leahy Scale: Poor Standing balance comment: Reliant on BUE and mod A +2.                            ADL either performed or assessed with clinical judgement   ADL Overall ADL's : Needs assistance/impaired   Eating/Feeding Details (indicate cue type and reason): currently NPO Grooming: Wash/dry face;Brushing hair Grooming Details (indicate cue type and reason): brushing hair (at EOB--Mod A with additonal A for balance (posterior lean); washing face (setup/S sitting in recliner) Upper Body Bathing: Minimal assistance;Sitting   Lower Body Bathing: Moderate assistance Lower Body Bathing Details (indicate cue type and reason): Mod A +2 sit<>stand Upper Body Dressing : Moderate assistance Upper Body Dressing Details (indicate cue type and reason): supported sitting Lower Body Dressing: Total assistance Lower Body Dressing Details (indicate cue type and reason): Mod A +2 sit<>stand Toilet Transfer: Moderate assistance;+2 for physical assistance;Ambulation Toilet Transfer Details (indicate cue type and reason): Bil HHA Toileting- Clothing Manipulation and Hygiene: Total assistance Toileting - Clothing Manipulation Details (indicate cue type and reason): Mod A +2 sit<>stand             Vision Baseline Vision/History: Wears glasses ("I don't have them with me")              Pertinent Vitals/Pain Pain Assessment: No/denies pain     Hand Dominance  right   Extremity/Trunk Assessment Upper Extremity Assessment  Upper Extremity Assessment: Defer to OT evaluation   Lower Extremity Assessment Lower Extremity Assessment: Generalized weakness   Cervical / Trunk Assessment Cervical / Trunk Assessment: Kyphotic   Communication Communication Communication: No difficulties    Cognition Arousal/Alertness: Awake/alert Behavior During Therapy: WFL for tasks assessed/performed Overall Cognitive Status: No family/caregiver present to determine baseline cognitive functioning                                 General Comments: Pt unable to state her birth month, but knew the year, did not know why she was in the hospital and did not know the current date. Pt kept repeating she was in the hospital. Reports "I'm glad you came to visit".    General Comments  No family present             Home Living Family/patient expects to be discharged to:: Private residence Living Arrangements: Children Available Help at Discharge: Family;Available 24 hours/day Type of Home: House Home Access: Stairs to enter [employer] of Steps: 1 step   Home Layout: Laundry or work area in basement;Two level;Able to live on main level with bedroom/bathroom     Bathroom Shower/Tub: Teacher, early years/pre: Standard     Home Equipment: Cane - single point   Additional Comments: Obtained info from previous notes as pt unable to accurately report.       Prior Functioning/Environment Level of Independence: Independent with assistive device(s)        Comments: Per previous notes, was using cane for ambulation, independent in self care        OT Problem List: Decreased strength;Decreased activity tolerance;Impaired balance (sitting and/or standing);Decreased safety awareness;Decreased cognition      OT Treatment/Interventions: Self-care/ADL training;DME and/or AE instruction;Patient/family education;Balance training    OT Goals(Current goals can be found in the care plan section) Acute Rehab OT Goals Patient Stated Goal: did not state OT Goal Formulation: Patient unable to participate in goal setting Time For Goal Achievement: 06/25/20 Potential to Achieve Goals: Good  OT Frequency: Min 2X/week           Co-evaluation PT/OT/SLP  Co-Evaluation/Treatment: Yes Reason for Co-Treatment: Necessary to address cognition/behavior during functional activity;For patient/therapist safety;To address functional/ADL transfers   OT goals addressed during session: ADL's and self-care;Strengthening/ROM      AM-PAC OT "6 Clicks" Daily Activity     Outcome Measure Help from another person eating meals?: Total (currently NPO) Help from another person taking care of personal grooming?: A Lot Help from another person toileting, which includes using toliet, bedpan, or urinal?: A Lot Help from another person bathing (including washing, rinsing, drying)?: A Lot Help from another person to put on and taking off regular upper body clothing?: A Lot Help from another person to put on and taking off regular lower body clothing?: Total 6 Click Score: 10   End of Session Equipment Utilized During Treatment: Gait belt  Activity Tolerance: Patient tolerated treatment well Patient left: in chair;with call bell/phone within reach;with chair alarm set  OT Visit Diagnosis: Unsteadiness on feet (R26.81);Other abnormalities of gait and mobility (R26.89);Muscle weakness (generalized) (M62.81);Other symptoms and signs involving cognitive function                Time: 1121-6244 OT Time Calculation (min): 20 min Charges:  OT General Charges $OT Visit: 1 Visit OT Evaluation $OT Eval Moderate Complexity: 1 Mod  [address], OTR/L Acute  Rehab Services Pager [phone] Office [phone]     Almon Register 06/11/2020, 10:55 AM

## 2020-06-12 LAB — CBC
HCT: 38.9 % (ref 36.0–46.0)
Hemoglobin: 12.8 g/dL (ref 12.0–15.0)
MCH: 29.1 pg (ref 26.0–34.0)
MCHC: 32.9 g/dL (ref 30.0–36.0)
MCV: 88.4 fL (ref 80.0–100.0)
Platelets: 118 10*3/uL — ABNORMAL LOW (ref 150–400)
RBC: 4.4 MIL/uL (ref 3.87–5.11)
RDW: 15 % (ref 11.5–15.5)
WBC: 5.5 10*3/uL (ref 4.0–10.5)
nRBC: 0 % (ref 0.0–0.2)

## 2020-06-12 LAB — COMPREHENSIVE METABOLIC PANEL
ALT: 21 U/L (ref 0–44)
AST: 24 U/L (ref 15–41)
Albumin: 3.3 g/dL — ABNORMAL LOW (ref 3.5–5.0)
Alkaline Phosphatase: 55 U/L (ref 38–126)
Anion gap: 8 (ref 5–15)
BUN: 10 mg/dL (ref 8–23)
CO2: 27 mmol/L (ref 22–32)
Calcium: 8.7 mg/dL — ABNORMAL LOW (ref 8.9–10.3)
Chloride: 100 mmol/L (ref 98–111)
Creatinine, Ser: 0.64 mg/dL (ref 0.44–1.00)
GFR calc Af Amer: >60 mL/min (ref >60)
GFR calc non Af Amer: >60 mL/min (ref >60)
Glucose, Bld: 102 mg/dL — ABNORMAL HIGH (ref 70–99)
Potassium: 3.3 mmol/L — ABNORMAL LOW (ref 3.5–5.1)
Sodium: 135 mmol/L (ref 135–145)
Total Bilirubin: 1.3 mg/dL — ABNORMAL HIGH (ref 0.3–1.2)
Total Protein: 5.6 g/dL — ABNORMAL LOW (ref 6.5–8.1)

## 2020-06-12 MED ORDER — POTASSIUM CHLORIDE CRYS ER 20 MEQ PO TBCR
40.0000 meq | EXTENDED_RELEASE_TABLET | Freq: Once | ORAL | Status: AC
  Administered 2020-06-12: 40 meq via ORAL
  Filled 2020-06-12: qty 2

## 2020-06-12 NOTE — Progress Notes (Signed)
PROGRESS NOTE    Kathryn Marquez  WFU:[id number] DOB: [birth year] DOA: 06/10/2020 PCP: [employer] [employer], MD   Brief Narrative:  Kathryn Marquez is a 86 y.o. female with medical history significant for chronic atrial fibrillation on anticoagulation with Xarelto, recent C. difficile colitis, hypertension, dyslipidemia, and prior CVA who was brought to the ED on account of worsening confusion as well as lethargy noted this morning by her daughter.  She was apparently fine yesterday evening and was able to eat and drink quite well with no significant issues.  She awakened and was only oriented to her name and was apparently not stating words correctly or making any sense otherwise.  She had 2 loose bowel movements as well and she is still undergoing treatment of her C. difficile colitis with vancomycin taper and is currently taking every other day vancomycin along with Florastor.  No significant weakness of the upper or lower extremities otherwise noted.  Patient has been having a significant frontal headache since this morning as well.  She has apparently been taking her medications as otherwise prescribed. Vital signs stable and patient is afebrile.  Blood pressures are noted to be elevated at bedside with systolic in the 202R and diastolics in the 427C.  She is still quite confused and complaining of headache.  Daughter is at bedside to give history. CT of the head with no acute disturbances noted with prior left-sided CVA regions noted. Urine analysis appears cloudy, but with no acute findings of UTI.  EKG with atrial fibrillation at 85 bpm noted. Chest x-ray with no acute findings. Covid swab performed and noted to be negative.  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 difficile colitis, POA - Discussed patient's case with ID given what appears to be acute worsening diarrhea while attempting to taper patient off vancomycin to every other day dosing with "numerous watery bowel movements over the past 48 hours" per family  - Will transition to Dificid 06/11/2020 per discussion with Dr. Baxter Flattery - Continue enteric precautions - Follow up with GI as scheduled outpatient, no acute indication for endoscopy or consult - BM now much more formed over past 62G  Acute metabolic encephalopathy likely in the setting of worsening C. difficile as above, POA -Likely infectious given C. difficile as below -Imaging and work-up for CVA versus TIA so far unremarkable, patient is now back to baseline per family after IV fluids and supportive care -Ammonia level is within normal limits at 9 -Venous blood gas with borderline elevation in pCO2, unlikely cause  AKI without history of CKD  -Likely in the setting of poor p.o. intake and dehydration given C. difficile colitis as above -Continue supplementation as indicated  Thrombocytopenia, possibly acute phase reactant given above - Appears to be an issue since 6/21 (although records prior to this are not available for 2 years). - Hold aspirin for now and patient is on Xarelto Lab Results  Component Value Date   PLT 118 (L) 06/12/2020   History of chronic atrial fibrillation - Currently rate controlled - Continue Xarelto for anticoagulation - Continue metoprolol for rate control - Monitor on telemetry  History of prior CVA - Hold aspirin for now given  thrombocytopenia and patient is on Xarelto - Statin  continued  History of hypertension - Allow permissive hypertension for the time being - Continue metoprolol for HR control  History of dyslipidemia - Statin ordered - Check lipid panel   DVT prophylaxis: Xarelto Code Status: DNR Family  Communication: Discussed with patient's son and daughter over the phone, we discussed possible need for physical therapy rehab or placement prior to discharge home and family had discussed the patient will be returning home regardless of recommendations and can do physical therapy at the house  Status is: Inpatient  Dispo: The patient is from: Home              Anticipated d/c is to: Home              Anticipated d/c date is: 24 to 48 hours              Patient currently not medically stable for discharge due to ongoing need for IV fluids, electrolyte replacement, new medication in the setting of worsening recurrent C. difficile and altered mental status.  Consultants:   Infectious disease  Procedures:   None indicated  Antimicrobials:  Discontinue oral vancomycin, start Dificid 06/11/2020  Subjective: No acute issues or events overnight, patient more awake alert and oriented this morning, she denies any overt nausea, vomiting, diarrhea, constipation, headache, fevers, chills.  Objective: Vitals:   06/11/20 1948 06/11/20 2023 06/12/20 0005 06/12/20 0344  BP: (!) 158/105 (!) 161/78 (!) 157/85 110/73  Pulse: 77  74 68  Resp: 18  21   Temp: (!) 97.5 F (36.4 C)  97.6 F (36.4 C) 98.6 F (37 C)  TempSrc: Oral  Oral Oral  SpO2: 98%  98% 99%  Weight:      Height:       No intake or output data in the 24 hours ending 06/12/20 0734 Filed Weights   06/10/20 1033  Weight: 59 kg    Examination:  General:  Pleasantly resting in bed, No acute distress. HEENT:  Normocephalic atraumatic.  Sclerae nonicteric, noninjected.  Extraocular movements intact bilaterally. Neck:  Without mass or deformity.  Trachea is midline. Lungs:  Clear to auscultate bilaterally without rhonchi, wheeze, or rales. Heart:  Regular rate and rhythm.  Without murmurs, rubs, or gallops. Abdomen:  Soft, nontender, nondistended.  Without guarding or rebound. Extremities: Without cyanosis, clubbing, edema, or  obvious deformity. Vascular:  Dorsalis pedis and posterior tibial pulses palpable bilaterally. Skin:  Warm and dry, no erythema, no ulcerations.   Data Reviewed: I have personally reviewed following labs and imaging studies  CBC: Recent Labs  Lab 06/10/20 1104 06/12/20 0212  WBC 5.9 5.5  HGB 13.9 12.8  HCT 43.8 38.9  MCV 93.6 88.4  PLT 106* 371*   Basic Metabolic Panel: Recent Labs  Lab 06/10/20 1259 06/12/20 0212  NA 137 135  K 3.9 3.3*  CL 98 100  CO2 27 27  GLUCOSE 119* 102*  BUN 9 10  CREATININE 0.59 0.64  CALCIUM 9.2 8.7*   GFR: Estimated Creatinine Clearance: 34 mL/min (by C-G formula based on SCr of 0.64 mg/dL). Liver Function Tests: Recent Labs  Lab 06/10/20 1259 06/12/20 0212  AST 34 24  ALT 26 21  ALKPHOS 73 55  BILITOT 1.0 1.3*  PROT 6.5 5.6*  ALBUMIN 3.9 3.3*   No results for input(s): LIPASE, AMYLASE in the last 168 hours. Recent Labs  Lab 06/10/20 1531  AMMONIA 9   Coagulation Profile: No results for  input(s): INR, PROTIME in the last 168 hours. Cardiac Enzymes: No results for input(s): CKTOTAL, CKMB, CKMBINDEX, TROPONINI in the last 168 hours. BNP (last 3 results) No results for input(s): PROBNP in the last 8760 hours. HbA1C: No results for input(s): HGBA1C in the last 72 hours. CBG: No results for input(s): GLUCAP in the last 168 hours. Lipid Profile: No results for input(s): CHOL, HDL, LDLCALC, TRIG, CHOLHDL, LDLDIRECT in the last 72 hours. Thyroid Function Tests: No results for input(s): TSH, T4TOTAL, FREET4, T3FREE, THYROIDAB in the last 72 hours. Anemia Panel: No results for input(s): VITAMINB12, FOLATE, FERRITIN, TIBC, IRON, RETICCTPCT in the last 72 hours. Sepsis Labs: No results for input(s): PROCALCITON, LATICACIDVEN in the last 168 hours.  Recent Results (from the past 240 hour(s))  SARS Coronavirus 2 by RT PCR (hospital order, performed in [facility] hospital lab) Nasopharyngeal Nasopharyngeal Swab     Status: None    Collection Time: 06/10/20  2:02 PM   Specimen: Nasopharyngeal Swab  Result Value Ref Range Status   SARS Coronavirus 2 NEGATIVE NEGATIVE Final    Comment: (NOTE) SARS-CoV-2 target nucleic acids are NOT DETECTED.  The SARS-CoV-2 RNA is generally detectable in upper and lower respiratory specimens during the acute phase of infection. The lowest concentration of SARS-CoV-2 viral copies this assay can detect is 250 copies / mL. A negative result does not preclude SARS-CoV-2 infection and should not be used as the sole basis for treatment or other patient management decisions.  A negative result may occur with improper specimen collection / handling, submission of specimen other than nasopharyngeal swab, presence of viral mutation(s) within the areas targeted by this assay, and inadequate number of viral copies (<250 copies / mL). A negative result must be combined with clinical observations, patient history, and epidemiological information.  Fact Sheet for Patients:   StrictlyIdeas.no  Fact Sheet for Healthcare Providers: BankingDealers.co.za  This test is not yet approved or  cleared by the [place] FDA and has been authorized for detection and/or diagnosis of SARS-CoV-2 by FDA under an Emergency Use Authorization (EUA).  This EUA will remain in effect (meaning this test can be used) for the duration of the COVID-19 declaration under Section 564(b)(1) of the Act, 21 U.S.C. section 360bbb-3(b)(1), unless the authorization is terminated or revoked sooner.  Performed at [facility], [address], [place], [place] [address]     Radiology Studies: CT HEAD WO CONTRAST  Result Date: 06/10/2020 CLINICAL DATA:  [place]irium EXAM: CT HEAD WITHOUT CONTRAST TECHNIQUE: Contiguous axial images were obtained from the base of the skull through the vertex without intravenous contrast. COMPARISON:  February 05, 2016 FINDINGS: Brain: No evidence of acute  infarction, hemorrhage, hydrocephalus, extra-axial collection or mass lesion/mass effect. Signs of prior infarct of the LEFT posterior frontal region similar to the study of 2017. Atrophy and white matter disease also similar. Similar appearance also in the LEFT occipital region. Signs of prior infarct in this area as seen on prior imaging. Vascular: No hyperdense vessel or unexpected calcification. Skull: Normal. Negative for fracture or focal lesion. Sinuses/Orbits: Visualized paranasal sinuses and orbits are unremarkable. Other: None IMPRESSION: 1. No acute intracranial pathology. 2. Signs of prior infarct of the LEFT posterior frontal region and LEFT parieto-occipital region similar to the study of 2017. 3. Atrophy and white matter disease. Electronically Signed   By: Zetta Bills M.D.   On: 06/10/2020 11:39   MR BRAIN WO CONTRAST  Result Date: 06/11/2020 CLINICAL DATA:  Initial evaluation for neuro  deficit, stroke suspected, altered mental status. EXAM: MRI HEAD WITHOUT CONTRAST TECHNIQUE: Multiplanar, multiecho pulse sequences of the brain and surrounding structures were obtained without intravenous contrast. COMPARISON:  Prior CT from 06/10/2020. FINDINGS: Brain: Examination technically limited as the patient was unable to tolerate the full length of the exam. Diffusion-weighted imaging as well as axial FLAIR and gradient echo sequences only were performed. Additionally, images are somewhat degraded by motion artifact. Generalized age-related cerebral atrophy. Patchy T2/FLAIR hyperintensity within the periventricular white matter most consistent with chronic small vessel ischemic disease, mild for age. Scattered areas of encephalomalacia gliosis involving the left frontal parietal and occipital regions compatible with chronic ischemic changes, somewhat watershed in distribution. Minimal associated chronic hemosiderin staining. No abnormal foci of restricted diffusion to suggest acute or subacute  ischemia. Gray-white matter differentiation otherwise maintained. No evidence for acute intracranial hemorrhage. No mass lesion, midline shift or mass effect. No hydrocephalus or extra-axial fluid collection. Vascular: Normal vascular flow void seen throughout the anterior the intracranial circulation. Posterior circulation not well seen, likely diminutive/hypoplastic. Skull and upper cervical spine: Grossly unremarkable on this limited exam. Sinuses/Orbits: Globes and orbital soft tissues grossly within normal limits. Paranasal sinuses are largely clear. No significant mastoid effusion. Other: None. IMPRESSION: 1. Technically limited exam due to the patient's inability to tolerate the full length of the study. 2. No acute intracranial infarct or other abnormality. 3. Scattered areas of encephalomalacia involving the left frontal, parietal and occipital regions, compatible with chronic ischemic changes, watershed in distribution. 4. Underlying age-related cerebral atrophy with mild chronic small vessel ischemic disease. Electronically Signed   By: Jeannine Boga M.D.   On: 06/11/2020 01:58   DG Chest Port 1 View  Result Date: 06/10/2020 CLINICAL DATA:  Patient brought in via EMS from home. Alert. Patient oriented to person but disoriented to place, situation, and time. Patient being brought in for altered mental status. Per paramedic patient was last seen at her normal yesterday at 6pm when eating supper and noticed to have altered mental status this morning at 7am. EXAM: PORTABLE CHEST 1 VIEW COMPARISON:  04/13/2020 FINDINGS: Cardiac silhouette is enlarged but stable. No mediastinal or hilar masses. Densely calcified pleural plaques along the left mid to lower hemithorax, stable. No evidence of pneumonia or pulmonary edema. No pleural effusion or pneumothorax. Skeletal structures are demineralized but grossly intact. IMPRESSION: 1. No acute cardiopulmonary disease. Stable appearance from the prior study  Electronically Signed   By: Lajean Manes M.D.   On: 06/10/2020 11:24   Scheduled Meds:  fidaxomicin  200 mg Oral BID   loratadine  10 mg Oral Daily   LORazepam  0.5 mg Intravenous Once   metoprolol succinate  25 mg Oral Daily   Rivaroxaban  15 mg Oral Daily   saccharomyces boulardii  250 mg Oral BID   simvastatin  40 mg Oral q1800    LOS: 2 days   Time spent: 52min  Barret Esquivel C Addalynne Golding, DO [facility]  If 7PM-7AM, please contact night-coverage [email or web address]  06/12/2020, 7:34 AM

## 2020-06-12 NOTE — Evaluation (Signed)
Speech Language Pathology Evaluation Patient Details Name: Kathryn Marquez MRN: [id number] DOB: Jan 19, 1921 Today's Date: 06/12/2020 Time: 9798-9211 SLP Time Calculation (min) (ACUTE ONLY): 21 min  Problem List:  Patient Active Problem List   Diagnosis Date Noted  . Recurrent Clostridioides difficile diarrhea 06/11/2020  . Acute metabolic encephalopathy 94/17/4081  . Dehydration 05/09/2020  . Colitis 05/08/2020  . C. difficile colitis 04/15/2020  . Hypokalemia 04/14/2020  . Abdominal pain 04/14/2020  . Acute diverticulitis 04/13/2020  . Head injury 02/05/2016  . Fall involving sidewalk curb 02/05/2016  . Facial trauma 02/05/2016  . Facial bruising 02/05/2016  . Fall 02/05/2016  . Dyslipidemia, goal LDL below 100 04/30/2012  . TIA (transient ischemic attack) 04/29/2012  . History of embolic stroke - 4481 R hemiparesis 04/29/2012  . Hypertension 04/29/2012  . Chronic atrial fibrillation ([place]) 04/29/2012  . CLOSED COLLES FRACTURE 08/02/2010   Past Medical History:  Past Medical History:  Diagnosis Date  . Acute embolic stroke ([place]) 8/56/3149  . Dyslipidemia, goal LDL below 100 04/30/2012  . Dysrhythmia    Chronic atrial fib  . Hypertension    Past Surgical History: History reviewed. No pertinent surgical history. HPI:  Kathryn Marquez is a 86 y.o. female with medical history significant for chronic atrial fibrillation on anticoagulation with Xarelto, recent C. difficile colitis, hypertension, dyslipidemia, and prior CVA who was brought to the ED on account of worsening confusion as well as lethargy noted this morning (7/31) by her daughter. MRI:No acute intracranial infarct or other abnormality, scattered areas of encephalomalacia involving the left frontal,parietal and occipital regions, compatible with chronic ischemicchanges, watershed in distribution. CXR 06/10/20: No acute abnormalities.   Assessment / Plan / Recommendation Clinical Impression  Pt demonstrates mild cognitive  impairment, particularly in verbal tasks. Her daughter reports that ever since her prior left CVA, whenever she is tired or sick she has mild aphasia. Pt needs incraesed visual cues (to see a persons face and mouth) and some repetition for adequate auditory comprehension. She hesitates with open ended questions and needs increased choice cues for word finding. Pt is not far from her baseline as she is particiapting appropriately in basic functional tasks (feeding herself) and is alert and cheerful. She may benefit from f/u with Home health SLP to address cognition in home setting. Will sign off acutely at this time.     SLP Assessment  SLP Recommendation/Assessment: All further Speech Lanaguage Pathology  needs can be addressed in the next venue of care SLP Visit Diagnosis: Cognitive communication deficit (R41.841)    Follow Up Recommendations  Home health SLP    Frequency and Duration           SLP Evaluation Cognition  Overall Cognitive Status: Impaired/Different from baseline Arousal/Alertness: Awake/alert Orientation Level: Oriented to person;Oriented to time;Disoriented to place;Disoriented to situation Attention: Sustained;Selective Sustained Attention: Appears intact Selective Attention: Appears intact Memory: Impaired Awareness: Impaired Awareness Impairment: Emergent impairment Problem Solving: Impaired Problem Solving Impairment: Verbal basic       Comprehension  Auditory Comprehension Overall Auditory Comprehension: Impaired Yes/No Questions: Within Functional Limits Commands: Within Functional Limits Conversation: Simple Interfering Components: Anxiety Reading Comprehension Reading Status: Not tested    Expression Verbal Expression Overall Verbal Expression: Impaired Initiation: Impaired Automatic Speech: Name;Social Response Level of Generative/Spontaneous Verbalization: Phrase;Sentence Repetition: No impairment Naming: Impairment Confrontation: Impaired    Oral / Motor  Oral Motor/Sensory Function Overall Oral Motor/Sensory Function: Within functional limits Motor Speech Overall Motor Speech: Appears within functional limits for tasks assessed  GO                   Herbie [place], MA [place] Pager [phone] Office [phone]  Lynann Beaver 06/12/2020, 12:18 PM

## 2020-06-12 NOTE — Progress Notes (Signed)
Potassium 3.3, [place] informed.

## 2020-06-12 NOTE — TOC Benefit Eligibility Note (Signed)
Transition of Care [facility]) Benefit Eligibility Note    Patient Details  Name: Kathryn Marquez MRN: [id number] Date of Birth: 12-01-1920   Medication/Dose: DIFICID  200 MG BID  Covered?: Yes  Tier:  (TIER- 5 DRUG)  Prescription Coverage Preferred Pharmacy: [facility] with Person/Company/Phone Number:: [facility]  @   [employer] #  [phone] OPT- 2  Co-Pay: $4,158.30  Prior Approval: No  Deductible:  (NO DEDUCTIBLE WITH PLAN  / PATIENT IN CATASTROPHIC PHASE)  Additional Notes: FIDAXOMICIN : Crecencio Mc Phone Number: 06/12/2020, 1:10 PM

## 2020-06-12 NOTE — Plan of Care (Signed)
  Problem: Education: Goal: Knowledge of General Education information will improve Description: Including pain rating scale, medication(s)/side effects and non-pharmacologic comfort measures Outcome: Progressing   Problem: Health Behavior/Discharge Planning: Goal: Ability to manage health-related needs will improve Outcome: Progressing   Problem: Clinical Measurements: Goal: Ability to maintain clinical measurements within normal limits will improve Outcome: Progressing Goal: Will remain free from infection Outcome: Progressing Goal: Diagnostic test results will improve Outcome: Progressing Goal: Respiratory complications will improve Outcome: Progressing Goal: Cardiovascular complication will be avoided Outcome: Progressing   Problem: Activity: Goal: Risk for activity intolerance will decrease Outcome: Progressing   Problem: Nutrition: Goal: Adequate nutrition will be maintained Outcome: Progressing   Problem: Coping: Goal: Level of anxiety will decrease Outcome: Progressing   Problem: Elimination: Goal: Will not experience complications related to bowel motility Outcome: Progressing Goal: Will not experience complications related to urinary retention Outcome: Progressing   Problem: Safety: Goal: Ability to remain free from injury will improve Outcome: Progressing   Problem: Skin Integrity: Goal: Risk for impaired skin integrity will decrease Outcome: Progressing   Problem: Education: Goal: Knowledge of disease or condition will improve Outcome: Progressing Goal: Knowledge of secondary prevention will improve Outcome: Progressing Goal: Knowledge of patient specific risk factors addressed and post discharge goals established will improve Outcome: Progressing Goal: Individualized Educational Video(s) Outcome: Progressing   Problem: Health Behavior/Discharge Planning: Goal: Ability to manage health-related needs will improve Outcome: Progressing   Problem:  Self-Care: Goal: Ability to participate in self-care as condition permits will improve Outcome: Progressing Goal: Ability to communicate needs accurately will improve Outcome: Progressing   Problem: Nutrition: Goal: Risk of aspiration will decrease Outcome: Progressing   Problem: Ischemic Stroke/TIA Tissue Perfusion: Goal: Complications of ischemic stroke/TIA will be minimized Outcome: Progressing

## 2020-06-12 NOTE — Progress Notes (Signed)
Patient has C diff and acute metabolic encephalopathy. She is requiring her head to be elevated 30 degrees or she may aspirate. She also requires frequent changes in position that a normal bed can not achieve.

## 2020-06-12 NOTE — TOC Progression Note (Addendum)
Transition of Care [facility]) - Progression Note    Patient Details  Name: Kathryn Marquez MRN: [id number] Date of Birth: June 13, 1921  [facility]) CM/SW Contact  Pollie Friar, RN Phone Number: 06/12/2020, 11:00 AM  Clinical Narrative:    CM met with the patients son and daughter at the bedside. They will update CM with the [facility] agency tomorrow am they would like to use.  Daughter requesting hospital bed for home. CM updated MD and CM will send order to [facility].  TOC following for further d/c needs.   Benefits check submitted for: Fidaxomicin (DIFICID)   Expected Discharge Plan: [place] Barriers to Discharge: Continued Medical Work up  Expected Discharge Plan and Services Expected Discharge Plan: [place] Choice: [place] arrangements for the past 2 months: Single Family Home                 DME Arranged: Hospital bed DME Agency: [facility] Date DME Agency Contacted: 06/12/20   Representative spoke with at DME Agency: Zack             Social Determinants of Health ([place]) Interventions    Readmission Risk Interventions No flowsheet data found.

## 2020-06-13 LAB — COMPREHENSIVE METABOLIC PANEL
ALT: 26 U/L (ref 0–44)
AST: 36 U/L (ref 15–41)
Albumin: 3.5 g/dL (ref 3.5–5.0)
Alkaline Phosphatase: 61 U/L (ref 38–126)
Anion gap: 10 (ref 5–15)
BUN: 9 mg/dL (ref 8–23)
CO2: 26 mmol/L (ref 22–32)
Calcium: 9.1 mg/dL (ref 8.9–10.3)
Chloride: 101 mmol/L (ref 98–111)
Creatinine, Ser: 0.59 mg/dL (ref 0.44–1.00)
GFR calc Af Amer: >60 mL/min (ref >60)
GFR calc non Af Amer: >60 mL/min (ref >60)
Glucose, Bld: 149 mg/dL — ABNORMAL HIGH (ref 70–99)
Potassium: 3.8 mmol/L (ref 3.5–5.1)
Sodium: 137 mmol/L (ref 135–145)
Total Bilirubin: 1.2 mg/dL (ref 0.3–1.2)
Total Protein: 6.1 g/dL — ABNORMAL LOW (ref 6.5–8.1)

## 2020-06-13 LAB — CBC
HCT: 42.7 % (ref 36.0–46.0)
Hemoglobin: 14 g/dL (ref 12.0–15.0)
MCH: 29 pg (ref 26.0–34.0)
MCHC: 32.8 g/dL (ref 30.0–36.0)
MCV: 88.4 fL (ref 80.0–100.0)
Platelets: 133 10*3/uL — ABNORMAL LOW (ref 150–400)
RBC: 4.83 MIL/uL (ref 3.87–5.11)
RDW: 14.8 % (ref 11.5–15.5)
WBC: 6.5 10*3/uL (ref 4.0–10.5)
nRBC: 0 % (ref 0.0–0.2)

## 2020-06-13 MED ORDER — SACCHAROMYCES BOULARDII 250 MG PO CAPS
250.0000 mg | ORAL_CAPSULE | Freq: Three times a day (TID) | ORAL | Status: DC
  Administered 2020-06-13 – 2020-06-15 (×6): 250 mg via ORAL
  Filled 2020-06-13 (×6): qty 1

## 2020-06-13 NOTE — Progress Notes (Signed)
Family unable to afford the Dificid at d/c. 30 days was over $2600. For the 10 days she has it ordered would be about $880.  CM submitted for Tier exception under her HTA but that was denied.  CM also provided the family a number for [place] co-pay assist that HTA recommended. They were going to call to see if patient may qualify for financial assist.

## 2020-06-13 NOTE — Plan of Care (Signed)
Mod assist adls 

## 2020-06-13 NOTE — Progress Notes (Signed)
Physical Therapy Treatment Patient Details Name: Kathryn Marquez MRN: [id number] DOB: 04-Nov-1921 Today's Date: 06/13/2020    History of Present Illness Kathryn Marquez is a 86 y.o. female with medical history significant for chronic atrial fibrillation on anticoagulation with Xarelto, recent C. difficile colitis, hypertension, dyslipidemia, and prior CVA who was brought to the ED on account of worsening confusion as well as lethargy noted this morning (7/31) by her daughter. MRI:No acute intracranial infarct or other abnormality, scattered areas of encephalomalacia involving the left frontal,parietal and occipital regions, compatible with chronic ischemicchanges, watershed in distribution    PT Comments    Patient making gradual progress with acute PT. Pt with improved initiation for bed mobility and now requiring min assist to steady at EOB. Patient required Mod assist +2 for safety to help with balance during sit<>stand transfers and gait. Patient educated on use of RW and required repeated cues for safe walker management and step pattern/proximity during gait. She ambulated increased distance this date and family educated on safe guarding position as they expressed plan to take pt home with them rather than discharge to SNF. Acute PT will continue to work with pt and progress mobility as able.    Follow Up Recommendations  SNF;Supervision/Assistance - 24 hour;Home health PT (pt family wishes for her to go home with HHPT vs SNF)     Equipment Recommendations  Rolling walker with 5" wheels;3in1 (PT);Hospital bed (if pt goes home)    Recommendations for Other Services       Precautions / Restrictions Precautions Precautions: Fall Restrictions Weight Bearing Restrictions: No    Mobility  Bed Mobility Overal bed mobility: Needs Assistance Bed Mobility: Supine to Sit     Supine to sit: Min assist;HOB elevated     General bed mobility comments: Pt with improved initiation with bed  mobility, able to bring LE's off EOB without assist, required min assist to raise trunk and scoot to EOB.  Transfers Overall transfer level: Needs assistance Equipment used: Rolling walker (2 wheeled) Transfers: Sit to/from Stand Sit to Stand: Mod assist;+2 safety/equipment         General transfer comment: Mod assist to initiate power up and steady, +2 for safety. Pt completed 2x form EOB and 1x from [facility]. Cues required each time for safe technique with RW.   Ambulation/Gait Ambulation/Gait assistance: Mod assist;+2 safety/equipment Gait Distance (Feet): 15 Feet Assistive device: Rolling walker (2 wheeled) Gait Pattern/deviations: Decreased step length - right;Decreased step length - left;Decreased stride length;Step-through pattern;Narrow base of support Gait velocity: decr   General Gait Details: Pt required Mod assist to manage walker position and steady to prevent LOB. Repeated cues for safe hand placement on walker and for step pattern, pt with NBOS.   Stairs             Wheelchair Mobility    Modified Rankin (Stroke Patients Only)       Balance Overall balance assessment: Needs assistance Sitting-balance support: Feet supported;Bilateral upper extremity supported Sitting balance-Leahy Scale: Fair Sitting balance - Comments: min guard at EOB and on BSC.   Standing balance support: During functional activity;Bilateral upper extremity supported Standing balance-Leahy Scale: Poor Standing balance comment: reliant on external support to maintain balance. Min assist to steady with staic balance while pt performed peri-care after using BSC.             Cognition Arousal/Alertness: Awake/alert Behavior During Therapy: WFL for tasks assessed/performed Overall Cognitive Status: Impaired/Different from baseline Area of Impairment: Orientation;Memory;Safety/judgement;Following commands;Problem  solving          Orientation Level: Disoriented  to;Place;Time;Situation   Memory: Decreased short-term memory Following Commands: Follows one step commands inconsistently Safety/Judgement: Decreased awareness of safety;Decreased awareness of deficits   Problem Solving: Slow processing;Difficulty sequencing;Requires verbal cues General Comments: pt oriented to herself but unable to state where she is or why she is at [facility]. Pt's family with her and report she is normally "very sharp".       Exercises      General Comments        Pertinent Vitals/Pain Pain Assessment: Faces Faces Pain Scale: Hurts even more Pain Location: headache Pain Descriptors / Indicators: Headache Pain Intervention(s): Limited activity within patient's tolerance;Monitored during session;Repositioned           PT Goals (current goals can now be found in the care plan section) Acute Rehab PT Goals PT Goal Formulation: Patient unable to participate in goal setting Time For Goal Achievement: 06/25/20 Potential to Achieve Goals: Good Progress towards PT goals: Progressing toward goals    Frequency    Min 2X/week      PT Plan Current plan remains appropriate    Co-evaluation              AM-PAC PT "6 Clicks" Mobility   Outcome Measure  Help needed turning from your back to your side while in a flat bed without using bedrails?: A Little Help needed moving from lying on your back to sitting on the side of a flat bed without using bedrails?: A Little Help needed moving to and from a bed to a chair (including a wheelchair)?: A Lot Help needed standing up from a chair using your arms (e.g., wheelchair or bedside chair)?: A Lot Help needed to walk in hospital room?: A Lot Help needed climbing 3-5 steps with a railing? : Total 6 Click Score: 13    End of Session Equipment Utilized During Treatment: Gait belt Activity Tolerance: Patient tolerated treatment well Patient left: in chair;with call bell/phone within reach;with chair alarm set;with  family/visitor present Nurse Communication: Mobility status PT Visit Diagnosis: Unsteadiness on feet (R26.81);Muscle weakness (generalized) (M62.81)     Time: 0086-7619 PT Time Calculation (min) (ACUTE ONLY): 29 min  Charges:  $Gait Training: 8-22 mins $Therapeutic Activity: 8-22 mins                     Verner Mould, DPT Acute Rehabilitation Services  Office [phone] Pager [phone]  06/13/2020 3:19 PM

## 2020-06-13 NOTE — Discharge Instructions (Signed)

## 2020-06-13 NOTE — Progress Notes (Signed)
PROGRESS NOTE    Kathryn Marquez  YPP:[id number] DOB: 08/31/1921 DOA: 06/10/2020 PCP: Redmond School, MD   Brief Narrative:  Kathryn Marquez is a 86 y.o. female with medical history significant for chronic atrial fibrillation on anticoagulation with Xarelto, recent C. difficile colitis, hypertension, dyslipidemia, and prior CVA who was brought to the ED on account of worsening confusion as well as lethargy noted this morning by her daughter.  She was apparently fine yesterday evening and was able to eat and drink quite well with no significant issues.  She awakened and was only oriented to her name and was apparently not stating words correctly or making any sense otherwise.  She had 2 loose bowel movements as well and she is still undergoing treatment of her C. difficile colitis with vancomycin taper and is currently taking every other day vancomycin along with Florastor.  No significant weakness of the upper or lower extremities otherwise noted.  Patient has been having a significant frontal headache since this morning as well.  She has apparently been taking her medications as otherwise prescribed. Vital signs stable and patient is afebrile.  Blood pressures are noted to be elevated at bedside with systolic in the 458K and diastolics in the 998P.  She is still quite confused and complaining of headache.  Daughter is at bedside to give history. CT of the head with no acute disturbances noted with prior left-sided CVA regions noted. Urine analysis appears cloudy, but with no acute findings of UTI.  EKG with atrial fibrillation at 85 bpm noted. Chest x-ray with no acute findings. Covid swab performed and noted to be negative.  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 difficile colitis, POA - Discussed patient's case with ID given what appears to be acute worsening diarrhea while attempting to taper patient off vancomycin to every other day dosing with "numerous watery bowel movements over the past 48 hours" per family prior to admission - Continue Dificid 06/11/2020 for 10 days per discussion with Dr. Baxter Flattery - Continue enteric precautions - Follow up with GI as scheduled outpatient, no acute indication for endoscopy or consult - BM now much more formed over past 48h -continue probiotic supplementation and yogurt, recommending 3 times daily dosing with family today -Tentative plan for discharge home given improvement however insurance will not pay for Dificid, infectious disease, pharmacy and case management are working with family for application for assistance as insurance will charge the patient over $2000 for remainder of Dificid course.  Given failure of vancomycin now x2 over the past 8 weeks it is certainly reasonable and certainly within IDSA guidelines to advance patient to [place], unclear why insurance company is not allowing prior authorization or other override at this time.  Acute metabolic encephalopathy likely in the setting of worsening C. difficile as above, POA, resolving -Likely infectious in etiology given C. difficile as below -Imaging and work-up for CVA versus TIA so far unremarkable, patient is now back to baseline per family after IV fluids and supportive care -Ammonia level is within normal limits at 9 -Venous blood gas with borderline elevation in pCO2, unlikely cause  AKI without history of CKD, resolved -Likely in the setting of  poor p.o. intake and dehydration given C. difficile colitis as above Lab Results  Component Value Date   CREATININE 0.59 06/13/2020   CREATININE 0.64 06/12/2020   CREATININE 0.59 06/10/2020   Thrombocytopenia, possibly acute phase  reactant given above - Appears to be an issue since 6/21 (although records prior to this are not available for 2 years). - Hold aspirin for now and patient is on Xarelto Lab Results  Component Value Date   PLT 133 (L) 06/13/2020   History of chronic atrial fibrillation - Currently rate controlled - Continue Xarelto for anticoagulation - Continue metoprolol for rate control - Monitor on telemetry  History of prior CVA - Hold aspirin for now given thrombocytopenia and patient is on Xarelto - Statin  continued  History of hypertension - Allow permissive hypertension for the time being - Continue metoprolol for HR control  History of dyslipidemia - Statin ordered - Check lipid panel   DVT prophylaxis: Xarelto Code Status: DNR Family Communication: Discussed with patient's son and daughter at bedside  Status is: Inpatient  Dispo: The patient is from: Home              Anticipated d/c is to: Home              Anticipated d/c date is: 24 to 48 hours              Patient currently not medically stable for discharge due to inability to obtain appropriate antibiotics.  Currently working with pharmacy, case management to obtain Dificid for patient's chronic recurrent C. difficile with failure of outpatient vancomycin now x2.  Once this medication can be obtained patient would be stable and otherwise reasonable for discharge home.  Consultants:   Infectious disease  Procedures:   None indicated  Antimicrobials:  Discontinue oral vancomycin, start Dificid 06/11/2020  Subjective: No acute issues or events overnight, patient more awake alert and oriented this morning, she denies any overt nausea, vomiting, diarrhea, constipation, headache, fevers, chills.  Objective: Vitals:   06/13/20 0359 06/13/20 0404 06/13/20 0957 06/13/20 1157  BP:  (!) 160/75 (!) 165/98 (!) 158/107  Pulse: 93 89 82 74  Resp:   (!) 25 16  Temp: 97.8 F (36.6 C)  97.8 F (36.6 C) 97.6 F (36.4 C)    TempSrc: Oral  Oral Oral  SpO2: 97%  97% 98%  Weight:      Height:        Intake/Output Summary (Last 24 hours) at 06/13/2020 1504 Last data filed at 06/13/2020 0416 Gross per 24 hour  Intake 240 ml  Output 250 ml  Net -10 ml   Filed Weights   06/10/20 1033  Weight: 59 kg    Examination:  General:  Pleasantly resting in bed, No acute distress. HEENT:  Normocephalic atraumatic.  Sclerae nonicteric, noninjected.  Extraocular movements intact bilaterally. Neck:  Without mass or deformity.  Trachea is midline. Lungs:  Clear to auscultate bilaterally without rhonchi, wheeze, or rales. Heart:  Regular rate and rhythm.  Without murmurs, rubs, or gallops. Abdomen:  Soft, nontender, nondistended.  Without guarding or rebound. Extremities: Without cyanosis, clubbing, edema, or obvious deformity. Vascular:  Dorsalis pedis and posterior tibial pulses palpable bilaterally. Skin:  Warm and dry, no erythema, no ulcerations.   Data Reviewed: I have personally reviewed following labs and imaging studies  CBC: Recent Labs  Lab 06/10/20 1104 06/12/20 0212 06/13/20 1017  WBC 5.9 5.5 6.5  HGB 13.9 12.8 14.0  HCT 43.8 38.9 42.7  MCV 93.6 88.4 88.4  PLT 106* 118* 502*   Basic Metabolic Panel: Recent Labs  Lab 06/10/20 1259 06/12/20 0212 06/13/20 1017  NA 137 135 137  K 3.9 3.3* 3.8  CL 98 100 101  CO2 27 27 26   GLUCOSE 119* 102* 149*  BUN 9 10 9   CREATININE 0.59 0.64 0.59  CALCIUM 9.2 8.7* 9.1   GFR: Estimated Creatinine Clearance: 34 mL/min (by C-G formula based on SCr of 0.59 mg/dL). Liver Function Tests: Recent Labs  Lab 06/10/20 1259 06/12/20 0212 06/13/20 1017  AST 34 24 36  ALT 26 21 26   ALKPHOS 73 55 61  BILITOT 1.0 1.3* 1.2  PROT 6.5 5.6* 6.1*  ALBUMIN 3.9 3.3* 3.5   No results for input(s): LIPASE, AMYLASE in the last 168 hours. Recent Labs  Lab 06/10/20 1531  AMMONIA 9   Coagulation Profile: No results for input(s): INR, PROTIME in the last 168  hours. Cardiac Enzymes: No results for input(s): CKTOTAL, CKMB, CKMBINDEX, TROPONINI in the last 168 hours. BNP (last 3 results) No results for input(s): PROBNP in the last 8760 hours. HbA1C: No results for input(s): HGBA1C in the last 72 hours. CBG: No results for input(s): GLUCAP in the last 168 hours. Lipid Profile: No results for input(s): CHOL, HDL, LDLCALC, TRIG, CHOLHDL, LDLDIRECT in the last 72 hours. Thyroid Function Tests: No results for input(s): TSH, T4TOTAL, FREET4, T3FREE, THYROIDAB in the last 72 hours. Anemia Panel: No results for input(s): VITAMINB12, FOLATE, FERRITIN, TIBC, IRON, RETICCTPCT in the last 72 hours. Sepsis Labs: No results for input(s): PROCALCITON, LATICACIDVEN in the last 168 hours.  Recent Results (from the past 240 hour(s))  SARS Coronavirus 2 by RT PCR (hospital order, performed in [facility] hospital lab) Nasopharyngeal Nasopharyngeal Swab     Status: None   Collection Time: 06/10/20  2:02 PM   Specimen: Nasopharyngeal Swab  Result Value Ref Range Status   SARS Coronavirus 2 NEGATIVE NEGATIVE Final    Comment: (NOTE) SARS-CoV-2 target nucleic acids are NOT DETECTED.  The SARS-CoV-2 RNA is generally detectable in upper and lower respiratory specimens during the acute phase of infection. The lowest concentration of SARS-CoV-2 viral copies this assay can detect is 250 copies / mL. A negative result does not preclude SARS-CoV-2 infection and should not be used as the sole basis for treatment or other patient management decisions.  A negative result may occur with improper specimen collection / handling, submission of specimen other than nasopharyngeal swab, presence of viral mutation(s) within the areas targeted by this assay, and inadequate number of viral copies (<250 copies / mL). A negative result must be combined with clinical observations, patient history, and epidemiological information.  Fact Sheet for Patients:    StrictlyIdeas.no  Fact Sheet for Healthcare Providers: BankingDealers.co.za  This test is not yet approved or  cleared by the [place] FDA and has been authorized for detection and/or diagnosis of SARS-CoV-2 by FDA under an Emergency Use Authorization (EUA).  This EUA will remain in effect (meaning this test can be used) for the duration of the COVID-19 declaration under Section 564(b)(1) of the Act, 21 U.S.C. section 360bbb-3(b)(1), unless the authorization is terminated or revoked sooner.  Performed at [facility], [address] [place], [place] [address]     Radiology Studies: No results found. Scheduled Meds: . fidaxomicin  200 mg Oral BID  . loratadine  10 mg Oral Daily  . LORazepam  0.5 mg Intravenous Once  . metoprolol succinate  25  mg Oral Daily  . Rivaroxaban  15 mg Oral Daily  . saccharomyces boulardii  250 mg Oral BID  . simvastatin  40 mg Oral q1800    LOS: 3 days   Time spent: 23min  Caralyn Twining C Joeangel Jeanpaul, DO [facility]  If 7PM-7AM, please contact night-coverage [email or web address]  06/13/2020, 3:04 PM

## 2020-06-13 NOTE — Plan of Care (Signed)

## 2020-06-14 DIAGNOSIS — I482 Chronic atrial fibrillation, unspecified: Secondary | ICD-10-CM

## 2020-06-14 DIAGNOSIS — I10 Essential (primary) hypertension: Secondary | ICD-10-CM

## 2020-06-14 LAB — CBC
HCT: 42.8 % (ref 36.0–46.0)
Hemoglobin: 14 g/dL (ref 12.0–15.0)
MCH: 28.9 pg (ref 26.0–34.0)
MCHC: 32.7 g/dL (ref 30.0–36.0)
MCV: 88.4 fL (ref 80.0–100.0)
Platelets: 148 10*3/uL — ABNORMAL LOW (ref 150–400)
RBC: 4.84 MIL/uL (ref 3.87–5.11)
RDW: 15 % (ref 11.5–15.5)
WBC: 7.4 10*3/uL (ref 4.0–10.5)
nRBC: 0 % (ref 0.0–0.2)

## 2020-06-14 LAB — COMPREHENSIVE METABOLIC PANEL
ALT: 25 U/L (ref 0–44)
AST: 34 U/L (ref 15–41)
Albumin: 3.6 g/dL (ref 3.5–5.0)
Alkaline Phosphatase: 55 U/L (ref 38–126)
Anion gap: 11 (ref 5–15)
BUN: 9 mg/dL (ref 8–23)
CO2: 26 mmol/L (ref 22–32)
Calcium: 9.3 mg/dL (ref 8.9–10.3)
Chloride: 99 mmol/L (ref 98–111)
Creatinine, Ser: 0.62 mg/dL (ref 0.44–1.00)
GFR calc Af Amer: >60 mL/min (ref >60)
GFR calc non Af Amer: >60 mL/min (ref >60)
Glucose, Bld: 107 mg/dL — ABNORMAL HIGH (ref 70–99)
Potassium: 3.4 mmol/L — ABNORMAL LOW (ref 3.5–5.1)
Sodium: 136 mmol/L (ref 135–145)
Total Bilirubin: 1.5 mg/dL — ABNORMAL HIGH (ref 0.3–1.2)
Total Protein: 6.1 g/dL — ABNORMAL LOW (ref 6.5–8.1)

## 2020-06-14 MED ORDER — POTASSIUM CHLORIDE CRYS ER 20 MEQ PO TBCR
40.0000 meq | EXTENDED_RELEASE_TABLET | Freq: Once | ORAL | Status: AC
  Administered 2020-06-14: 40 meq via ORAL
  Filled 2020-06-14: qty 2

## 2020-06-14 MED ORDER — LACTATED RINGERS IV SOLN: INTRAVENOUS | Status: DC

## 2020-06-14 MED ORDER — AMLODIPINE BESYLATE 5 MG PO TABS
5.0000 mg | ORAL_TABLET | Freq: Every day | ORAL | Status: DC
  Administered 2020-06-14 – 2020-06-15 (×2): 5 mg via ORAL
  Filled 2020-06-14 (×2): qty 1

## 2020-06-14 MED ORDER — ACETAMINOPHEN 325 MG PO TABS
650.0000 mg | ORAL_TABLET | Freq: Four times a day (QID) | ORAL | Status: DC
  Administered 2020-06-14 – 2020-06-15 (×5): 650 mg via ORAL
  Filled 2020-06-14 (×5): qty 2

## 2020-06-14 NOTE — Progress Notes (Signed)
Potassium 3.4, Zierle-Ghosh informed

## 2020-06-14 NOTE — Plan of Care (Signed)

## 2020-06-14 NOTE — Progress Notes (Signed)
[facility]  PROGRESS NOTE  Kathryn Marquez YWV:[id number] DOB: May 27, 1921 DOA: 06/10/2020 PCP: Redmond School, MD   Brief HPI:   86 year old female with history of chronic atrial fibrillation on anticoagulant Xarelto, recent C. difficile colitis, hypertension, dyslipidemia, prior CVA was brought to the ED with worsening confusion and lethargy.  Patient was treated for C. difficile colitis with vancomycin taper.  She was doing well and then suddenly started having diarrhea on Saturday.  This was associated with confusion.  CT head was negative.    Subjective   Patient seen and examined, continues to be pleasantly confused.  Diarrhea is improving.   Assessment/Plan:     1. Recurrent C. difficile colitis-patient presenting with worsening C. difficile colitis.  She was tapered off vancomycin and has recurrence of C. difficile colitis.  She was started on Dificid on 06/11/2020 for 10 days. Diarrhea is improving. 2. Metabolic encephalopathy-secondary to worsening C. difficile colitis as above.  Fluctuating course.  Ammonia level is 9.  Hopefully should improve with improvement in C. difficile colitis. 3. Acute kidney injury-likely in the setting of poor p.o. intake and dehydration from above.  Creatinine is back to baseline. 4. Thrombocytopenia-unclear etiology, likely in the setting of C. difficile colitis.  Slowly improving.  Follow platelets in a.m. 5. History of chronic atrial fibrillation-heart rate is controlled.  Continue Xarelto for anticoagulation.  Continue metoprolol for rate control. 6. History of prior CVA-patient is on Xarelto.  Aspirin on hold due to thrombocytopenia.  Continue statin. 7. History of hypertension-blood pressure is elevated, continue metoprolol succinate 25 mg daily.  Will add amlodipine 5 mg daily.   Scheduled medications:   . acetaminophen  650 mg Oral Q6H  . fidaxomicin  200 mg Oral BID  . loratadine  10 mg Oral Daily  . LORazepam  0.5 mg Intravenous  Once  . metoprolol succinate  25 mg Oral Daily  . Rivaroxaban  15 mg Oral Daily  . saccharomyces boulardii  250 mg Oral TID AC  . simvastatin  40 mg Oral q1800         CBG: No results for input(s): GLUCAP in the last 168 hours.  SpO2: 100 %    CBC: Recent Labs  Lab 06/10/20 1104 06/12/20 0212 06/13/20 1017 06/14/20 0334  WBC 5.9 5.5 6.5 7.4  HGB 13.9 12.8 14.0 14.0  HCT 43.8 38.9 42.7 42.8  MCV 93.6 88.4 88.4 88.4  PLT 106* 118* 133* 148*    Basic Metabolic Panel: Recent Labs  Lab 06/10/20 1259 06/12/20 0212 06/13/20 1017 06/14/20 0334  NA 137 135 137 136  K 3.9 3.3* 3.8 3.4*  CL 98 100 101 99  CO2 27 27 26 26   GLUCOSE 119* 102* 149* 107*  BUN 9 10 9 9   CREATININE 0.59 0.64 0.59 0.62  CALCIUM 9.2 8.7* 9.1 9.3     Liver Function Tests: Recent Labs  Lab 06/10/20 1259 06/12/20 0212 06/13/20 1017 06/14/20 0334  AST 34 24 36 34  ALT 26 21 26 25   ALKPHOS 73 55 61 55  BILITOT 1.0 1.3* 1.2 1.5*  PROT 6.5 5.6* 6.1* 6.1*  ALBUMIN 3.9 3.3* 3.5 3.6     Antibiotics: Anti-infectives (From admission, onward)   Start     Dose/Rate Route Frequency Ordered Stop   06/11/20 1445  fidaxomicin (DIFICID) tablet 200 mg     Discontinue    Note to Pharmacy: Discussed w/ Baxter Flattery - given failure of Vanco x2   200 mg Oral 2 times daily  06/11/20 1442 06/21/20 0959   06/11/20 1000  vancomycin (VANCOCIN) 125 MG capsule 125 mg  Status:  Discontinued        125 mg Oral Every other day 06/10/20 1610 06/11/20 1442       DVT prophylaxis: Xarelto  Code Status: Full code  Family Communication: No family at bedside    Status is: Inpatient  Dispo: The patient is from: Home              Anticipated d/c is to: Home              Anticipated d/c date is: 06/15/2020              Patient currently not medically stable for discharge  Barrier to discharge-continues to be confused          Consultants:  Infectious disease  Procedures:     Objective   Vitals:    06/13/20 2300 06/14/20 0335 06/14/20 0736 06/14/20 1329  BP: (!) 155/83 (!) 167/97 (!) 163/87 (!) 162/89  Pulse: 66 86 86 87  Resp: 19  20 18   Temp: (!) 97.4 F (36.3 C) (!) 97.5 F (36.4 C) 98.1 F (36.7 C) 98.2 F (36.8 C)  TempSrc: Oral Oral Axillary Oral  SpO2: 98% 96% 98% 100%  Weight:      Height:       No intake or output data in the 24 hours ending 06/14/20 1748  08/02 1901 - 08/04 0700 In: -  Out: 250 [Urine:250]  Filed Weights   06/10/20 1033  Weight: 59 kg    Physical Examination:    General: Appears in no acute distress  Cardiovascular: S1-S2, regular  Respiratory: Clear to auscultation bilaterally, no wheezing or crackles auscultated  Abdomen: Abdomen is soft, nontender, no organomegaly  Extremities: No edema in the lower extremities  Neurologic: Alert , oriented to self, people, month.  Not oriented to place.    Data Reviewed:   Recent Results (from the past 240 hour(s))  SARS Coronavirus 2 by RT PCR (hospital order, performed in [facility] hospital lab) Nasopharyngeal Nasopharyngeal Swab     Status: None   Collection Time: 06/10/20  2:02 PM   Specimen: Nasopharyngeal Swab  Result Value Ref Range Status   SARS Coronavirus 2 NEGATIVE NEGATIVE Final    Comment: (NOTE) SARS-CoV-2 target nucleic acids are NOT DETECTED.  The SARS-CoV-2 RNA is generally detectable in upper and lower respiratory specimens during the acute phase of infection. The lowest concentration of SARS-CoV-2 viral copies this assay can detect is 250 copies / mL. A negative result does not preclude SARS-CoV-2 infection and should not be used as the sole basis for treatment or other patient management decisions.  A negative result may occur with improper specimen collection / handling, submission of specimen other than nasopharyngeal swab, presence of viral mutation(s) within the areas targeted by this assay, and inadequate number of viral copies (<250 copies / mL). A  negative result must be combined with clinical observations, patient history, and epidemiological information.  Fact Sheet for Patients:   StrictlyIdeas.no  Fact Sheet for Healthcare Providers: BankingDealers.co.za  This test is not yet approved or  cleared by the [place] FDA and has been authorized for detection and/or diagnosis of SARS-CoV-2 by FDA under an Emergency Use Authorization (EUA).  This EUA will remain in effect (meaning this test can be used) for the duration of the COVID-19 declaration under Section 564(b)(1) of the Act, 21 U.S.C. section 360bbb-3(b)(1), unless  the authorization is terminated or revoked sooner.  Performed at [facility], [address], [place], [place] [address]     No results for input(s): LIPASE, AMYLASE in the last 168 hours. Recent Labs  Lab 06/10/20 1531  AMMONIA 9        [facility] If 7PM-7AM, please contact night-coverage at [email or web address], Office  [phone]   06/14/2020, 5:48 PM  LOS: 4 days

## 2020-06-14 NOTE — Progress Notes (Signed)
ID Pharmacy Note  Kathryn Marquez has been approved for Dificid patient assistance. The company will mail a supply to her home and it will arrive tomorrow 8/5.    Jimmy Footman, PharmD, BCPS, BCIDP Infectious Diseases Clinical Pharmacist Phone: [phone] 06/14/2020 11:16 AM

## 2020-06-15 ENCOUNTER — Ambulatory Visit (INDEPENDENT_AMBULATORY_CARE_PROVIDER_SITE_OTHER): Payer: PPO | Admitting: Internal Medicine

## 2020-06-15 DIAGNOSIS — Z8673 Personal history of transient ischemic attack (TIA), and cerebral infarction without residual deficits: Secondary | ICD-10-CM

## 2020-06-15 DIAGNOSIS — R4182 Altered mental status, unspecified: Secondary | ICD-10-CM

## 2020-06-15 DIAGNOSIS — E876 Hypokalemia: Secondary | ICD-10-CM

## 2020-06-15 LAB — COMPREHENSIVE METABOLIC PANEL
ALT: 25 U/L (ref 0–44)
AST: 30 U/L (ref 15–41)
Albumin: 3.5 g/dL (ref 3.5–5.0)
Alkaline Phosphatase: 53 U/L (ref 38–126)
Anion gap: 12 (ref 5–15)
BUN: 9 mg/dL (ref 8–23)
CO2: 26 mmol/L (ref 22–32)
Calcium: 9.1 mg/dL (ref 8.9–10.3)
Chloride: 101 mmol/L (ref 98–111)
Creatinine, Ser: 0.7 mg/dL (ref 0.44–1.00)
GFR calc Af Amer: >60 mL/min (ref >60)
GFR calc non Af Amer: >60 mL/min (ref >60)
Glucose, Bld: 107 mg/dL — ABNORMAL HIGH (ref 70–99)
Potassium: 3.4 mmol/L — ABNORMAL LOW (ref 3.5–5.1)
Sodium: 139 mmol/L (ref 135–145)
Total Bilirubin: 1.5 mg/dL — ABNORMAL HIGH (ref 0.3–1.2)
Total Protein: 5.7 g/dL — ABNORMAL LOW (ref 6.5–8.1)

## 2020-06-15 LAB — CBC
HCT: 40.3 % (ref 36.0–46.0)
Hemoglobin: 13.6 g/dL (ref 12.0–15.0)
MCH: 29.8 pg (ref 26.0–34.0)
MCHC: 33.7 g/dL (ref 30.0–36.0)
MCV: 88.2 fL (ref 80.0–100.0)
Platelets: 146 10*3/uL — ABNORMAL LOW (ref 150–400)
RBC: 4.57 MIL/uL (ref 3.87–5.11)
RDW: 14.9 % (ref 11.5–15.5)
WBC: 6.4 10*3/uL (ref 4.0–10.5)
nRBC: 0 % (ref 0.0–0.2)

## 2020-06-15 LAB — MAGNESIUM: Magnesium: 1.6 mg/dL — ABNORMAL LOW (ref 1.7–2.4)

## 2020-06-15 MED ORDER — AMLODIPINE BESYLATE 5 MG PO TABS: 5.0000 mg | ORAL_TABLET | Freq: Every day | ORAL | 2 refills | Status: DC

## 2020-06-15 MED ORDER — SIMVASTATIN 40 MG PO TABS: 40.0000 mg | ORAL_TABLET | Freq: Every day | ORAL | 3 refills | Status: DC

## 2020-06-15 MED ORDER — SACCHAROMYCES BOULARDII 250 MG PO CAPS: 250.0000 mg | ORAL_CAPSULE | Freq: Two times a day (BID) | ORAL | 0 refills | Status: AC

## 2020-06-15 MED ORDER — POTASSIUM CHLORIDE CRYS ER 20 MEQ PO TBCR
40.0000 meq | EXTENDED_RELEASE_TABLET | Freq: Once | ORAL | Status: AC
  Administered 2020-06-15: 40 meq via ORAL
  Filled 2020-06-15: qty 2

## 2020-06-15 MED ORDER — MAGNESIUM SULFATE IN D5W 1-5 GM/100ML-% IV SOLN
1.0000 g | Freq: Once | INTRAVENOUS | Status: AC
  Administered 2020-06-15: 1 g via INTRAVENOUS
  Filled 2020-06-15: qty 100

## 2020-06-15 MED ORDER — FIDAXOMICIN 200 MG PO TABS: 200.0000 mg | ORAL_TABLET | Freq: Two times a day (BID) | ORAL | Status: DC

## 2020-06-15 NOTE — Evaluation (Signed)
Occupational Therapy Treatment Patient Details Name: Kathryn Marquez MRN: [id number] DOB: [birth year] Today's Date: 06/15/2020    History of present illness Kathryn Marquez is a 86 y.o. female with medical history significant for chronic atrial fibrillation on anticoagulation with Xarelto, recent C. difficile colitis, hypertension, dyslipidemia, and prior CVA who was brought to the ED on account of worsening confusion as well as lethargy noted this morning (7/31) by her daughter. MRI:No acute intracranial infarct or other abnormality, scattered areas of encephalomalacia involving the left frontal,parietal and occipital regions, compatible with chronic ischemicchanges, watershed in distribution   OT comments  Upon arrival pt up in bed and agreeable to skilled-OT session. Pt pleasant and eager to get out of bed and use the bathroom. Pt completed stand-pivot transfer to [facility] with Mod A +2. Pt needed Total A for perineal care and Min A +2 for mobility with RW to sink. Pt completed hand and face washing with Min A and multimodal cues for safety and sequencing. Pt returned to bed, needing max verbal cues for safety with RW, with bed alarm and call bell/phone within reach. The pt will continue to benefit from significant skilled therapy to facilitate return to higher levels of independence with transfers and short ambulation to decrease need for 24/7 hands-on assist by family members. The pt's family arrived after the completion of the session, and confirmed 24/7 assist and desire for pt to complete therapy at home rather than SNF.    Follow Up Recommendations  Home health OT;Supervision/Assistance - 24 hour    Equipment Recommendations  Other (comment) (Pt family confirm obtaining all equipment needed)       Precautions / Restrictions Precautions Precautions: Fall Restrictions Weight Bearing Restrictions: No       Mobility Bed Mobility Overal bed mobility: Needs Assistance Bed Mobility: Supine to  Sit     Supine to sit: Supervision     General bed mobility comments: pt able to move to EOB without assist but with significant extra time and cues for sequencing.  Transfers Overall transfer level: Needs assistance Equipment used: Rolling walker (2 wheeled);2 person hand held assist Transfers: Sit to/from [facility] Sit to Stand: +2 safety/equipment;Min assist Stand pivot transfers: Mod assist;+2 physical assistance       General transfer comment: Mod A to power up and steady with HHA. Min A of 2 to steady with RW.     Balance Overall balance assessment: Needs assistance Sitting-balance support: Feet supported Sitting balance-Leahy Scale: Fair Sitting balance - Comments: min guard at EOB and on BSC.   Standing balance support: During functional activity;Bilateral upper extremity supported Standing balance-Leahy Scale: Poor Standing balance comment: reliant on external support to maintain balance. Min assist to steady with staic balance while pt performed peri-care after using BSC.                           ADL either performed or assessed with clinical judgement   ADL Overall ADL's : Needs assistance/impaired     Grooming: Wash/dry hands;Wash/dry face;Cueing for safety;Cueing for sequencing;Standing;Minimal assistance Grooming Details (indicate cue type and reason): Min A for steady with washing hands and face at sink         Upper Body Dressing : Minimal assistance;Sitting;Cueing for safety;Cueing for sequencing Upper Body Dressing Details (indicate cue type and reason): Min A for donning new gown     Toilet Transfer: Moderate assistance;+2 for physical assistance;Stand-pivot;BSC (2 HHA ) Toilet Transfer  Details (indicate cue type and reason): Bil HHA Toileting- Clothing Manipulation and Hygiene: Total assistance;Sit to/from stand Toileting - Clothing Manipulation Details (indicate cue type and reason): Mod A sit>stand. Pt reliant on BUE  support     Functional mobility during ADLs: Minimal assistance;Rolling walker;Cueing for safety;Cueing for sequencing General ADL Comments: Min - Total A due to cognition, weakness, and decreased balance.               Cognition Arousal/Alertness: Awake/alert Behavior During Therapy: WFL for tasks assessed/performed Overall Cognitive Status: Impaired/Different from baseline Area of Impairment: Memory;Safety/judgement;Problem solving                     Memory: Decreased short-term memory   Safety/Judgement: Decreased awareness of safety;Decreased awareness of deficits   Problem Solving: Slow processing;Difficulty sequencing;Requires verbal cues General Comments: Pt repeatedly asking if we have seen her family. Pt with poor safety awareness and with RW. Requires verbal cues for sequencing and safety with ADLs.              General Comments family not present, discussed after session regarding pt's need for hands-on assist for all mobility    Pertinent Vitals/ Pain       Pain Assessment: Faces Faces Pain Scale: No hurt Pain Intervention(s): Monitored during session  [place] expects to be discharged to:: Private residence Living Arrangements: Children Available Help at Discharge: Family;Available 24 hours/day Type of Home: House Home Access: Stairs to enter [employer] of Steps: 1 step                                  Frequency  Min 2X/week        Progress Toward Goals  OT Goals(current goals can now be found in the care plan section)  Progress towards OT goals: Progressing toward goals  Acute Rehab OT Goals Patient Stated Goal: did not state  Plan Discharge plan needs to be updated    Co-evaluation    PT/OT/SLP Co-Evaluation/Treatment: Yes Reason for Co-Treatment: Necessary to address cognition/behavior during functional activity;To address functional/ADL transfers PT goals addressed during session:  Mobility/safety with mobility;Balance;Proper use of DME OT goals addressed during session: ADL's and self-care;Proper use of [employer] equipment and DME      AM-PAC OT "6 Clicks" Daily Activity     Outcome Measure   Help from another person eating meals?: A Little Help from another person taking care of personal grooming?: A Little Help from another person toileting, which includes using toliet, bedpan, or urinal?: Total Help from another person bathing (including washing, rinsing, drying)?: A Lot Help from another person to put on and taking off regular upper body clothing?: A Little Help from another person to put on and taking off regular lower body clothing?: Total 6 Click Score: 13    End of Session Equipment Utilized During Treatment: Gait belt;Rolling walker  OT Visit Diagnosis: Unsteadiness on feet (R26.81);Other abnormalities of gait and mobility (R26.89);Muscle weakness (generalized) (M62.81);Other symptoms and signs involving cognitive function   Activity Tolerance Patient tolerated treatment well   Patient Left in bed;with call bell/phone within reach;with bed alarm set   Nurse Communication Mobility status        Time: 1029-1100 OT Time Calculation (min): 31 min  Charges: OT General Charges $OT Visit: 1 Visit OT Treatments $Self Care/Home Management : 8-22 mins  Niaomi Cartaya/OTS   Elon Eoff 06/15/2020, 12:52 PM

## 2020-06-15 NOTE — Progress Notes (Signed)
Physical Therapy Treatment Patient Details Name: Kathryn Marquez MRN: [id number] DOB: 20-Jan-1921 Today's Date: 06/15/2020    History of Present Illness Kathryn Marquez is a 86 y.o. female with medical history significant for chronic atrial fibrillation on anticoagulation with Xarelto, recent C. difficile colitis, hypertension, dyslipidemia, and prior CVA who was brought to the ED on account of worsening confusion as well as lethargy noted this morning (7/31) by her daughter. MRI:No acute intracranial infarct or other abnormality, scattered areas of encephalomalacia involving the left frontal,parietal and occipital regions, compatible with chronic ischemicchanges, watershed in distribution    PT Comments    Pt in bed upon arrival of PT/OT, agreeable to session with focus on progressing functional mobility and stability in her room. The pt was able to demo increased activity tolerance and mobility today, but continues to require either modA of 2 with HHA or minA of 2 for safety with RW to maintain stability and to nagivate around obstacles. The pt requires manual assist to manage RW at all times, as well as repeated cues to maintain hands on grips as well as for use of hands in transfers. The pt will continue to benefit from significant skilled therapy to facilitate return to higher levels of independence with transfers and short ambulation to decrease need for 24/7 hands-on assist by family members. The pt's family arrived after the completion of the session, but were updated on the pt's progress and needs for physical assistance with all mobility as they still desire to take the pt home rather than pursuing SNF.     Follow Up Recommendations  Supervision/Assistance - 24 hour;Home health PT     Equipment Recommendations  Rolling walker with 5" wheels;3in1 (PT);Hospital bed    Recommendations for Other Services       Precautions / Restrictions Precautions Precautions: Fall Restrictions Weight  Bearing Restrictions: No    Mobility  Bed Mobility Overal bed mobility: Needs Assistance Bed Mobility: Supine to Sit     Supine to sit: Supervision     General bed mobility comments: pt able to move to EOB without assist but with significant extra time and cues for sequencing.  Transfers Overall transfer level: Needs assistance Equipment used: Rolling walker (2 wheeled);2 person hand held assist Transfers: Sit to/from Stand Sit to Stand: +2 safety/equipment;Min assist         General transfer comment: modA to power up and steady with HHA, minA of 2 to steady with use of RW. The pt completed x4 through the session, cues for hand placement each time with multiple reps before pt adhered  Ambulation/Gait Ambulation/Gait assistance: Mod assist;+2 safety/equipment Gait Distance (Feet): 10 Feet (+ 10 ft) Assistive device: Rolling walker (2 wheeled) Gait Pattern/deviations: Decreased step length - right;Decreased step length - left;Decreased stride length;Step-through pattern;Narrow base of support Gait velocity: decr Gait velocity interpretation: <1.8 ft/sec, indicate of risk for recurrent falls General Gait Details: pt requires modA to steady with HHA of 2, or minA to steady with minA to manage RW to prevent LOB and running into objects. Repeated cues for hand placement throughout session      Balance Overall balance assessment: Needs assistance Sitting-balance support: Feet supported Sitting balance-Leahy Scale: Fair Sitting balance - Comments: min guard at EOB and on BSC.   Standing balance support: During functional activity;Bilateral upper extremity supported Standing balance-Leahy Scale: Poor Standing balance comment: reliant on external support to maintain balance. Min assist to steady with staic balance while pt performed peri-care after using BSC.  Cognition Arousal/Alertness: Awake/alert Behavior During Therapy: WFL for tasks  assessed/performed Overall Cognitive Status: Impaired/Different from baseline Area of Impairment: Memory;Safety/judgement;Problem solving                     Memory: Decreased short-term memory   Safety/Judgement: Decreased awareness of safety;Decreased awareness of deficits   Problem Solving: Slow processing;Difficulty sequencing;Requires verbal cues General Comments: Pt unable to recall information about home set up/family. Asked x3 through session if therapists had seen her family. Pt with poor safety awareness, especially with RW and requires multiple cues/minA to manage RW      Exercises      General Comments General comments (skin integrity, edema, etc.): family not present, discussed after session regarding pt's need for hands-on assist for all mobility      Pertinent Vitals/Pain Pain Assessment: Faces Faces Pain Scale: No hurt Pain Intervention(s): Monitored during session           PT Goals (current goals can now be found in the care plan section) Acute Rehab PT Goals Patient Stated Goal: did not state PT Goal Formulation: Patient unable to participate in goal setting Time For Goal Achievement: 06/25/20 Potential to Achieve Goals: Good Progress towards PT goals: Progressing toward goals    Frequency    Min 2X/week      PT Plan Current plan remains appropriate    Co-evaluation PT/OT/SLP Co-Evaluation/Treatment: Yes Reason for Co-Treatment: For patient/therapist safety;To address functional/ADL transfers PT goals addressed during session: Mobility/safety with mobility;Balance;Proper use of DME        AM-PAC PT "6 Clicks" Mobility   Outcome Measure  Help needed turning from your back to your side while in a flat bed without using bedrails?: A Little Help needed moving from lying on your back to sitting on the side of a flat bed without using bedrails?: A Little Help needed moving to and from a bed to a chair (including a wheelchair)?: A  Lot Help needed standing up from a chair using your arms (e.g., wheelchair or bedside chair)?: A Little Help needed to walk in hospital room?: A Lot Help needed climbing 3-5 steps with a railing? : A Lot 6 Click Score: 15    End of Session Equipment Utilized During Treatment: Gait belt Activity Tolerance: Patient tolerated treatment well Patient left: in bed;with call bell/phone within reach;with bed alarm set;with family/visitor present (chair position) Nurse Communication: Mobility status (elevated BP) PT Visit Diagnosis: Unsteadiness on feet (R26.81);Muscle weakness (generalized) (M62.81)     Time: 0929-5747 PT Time Calculation (min) (ACUTE ONLY): 28 min  Charges:  $Gait Training: 8-22 mins                     Karma Ganja, PT, DPT   Acute Rehabilitation Department Pager #: [phone]   Otho Bellows 06/15/2020, 12:18 PM

## 2020-06-15 NOTE — Progress Notes (Signed)
Patient's daughter educated on discharge instructions, questions answered. Belongings returned to patient

## 2020-06-15 NOTE — Discharge Summary (Signed)
Physician Discharge Summary  Kathryn Marquez OMV:[id number] DOB: 11/16/1920 DOA: 06/10/2020  PCP: Kathryn School, MD  Admit date: 06/10/2020 Discharge date: 06/15/2020  Time spent: 50 minutes  Recommendations for Outpatient Follow-up:  1. Follow-up PCP in 2 weeks 2. Patient to go home with home health PT OT 3. 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 difficile colitis, hypertension, dyslipidemia, prior CVA was brought to the ED with worsening confusion and lethargy.  Patient was treated for C. difficile colitis with vancomycin taper.  She was doing well and then suddenly started having diarrhea on Saturday.  This was associated with confusion.  CT head was negative. [facility] Course:  1. *Recurrent C. difficile colitis-patient presenting with worsening C. difficile colitis.  She was tapered off vancomycin and has recurrence of C. difficile colitis.  She was started on Dificid on 06/11/2020 for 10 days. Diarrhea is improving.  Dificid will be mailed by patient's pharmacy to her home today. 2. Metabolic encephalopathy-significantly improved, secondary to worsening C. difficile colitis as above.  Fluctuating course.  Ammonia level is 9.  Hopefully should improve with continued improvement in C. difficile colitis. 3. Acute kidney injury-likely in the setting of poor p.o. intake and dehydration from  above.  Creatinine is back to baseline. 4. Thrombocytopenia-unclear etiology, likely in the setting of C. difficile colitis.  Slowly improving.  Today platelet count is 1 46,000. 5. History of chronic atrial fibrillation-heart rate is controlled.  Continue Xarelto for anticoagulation.  Continue metoprolol for rate control. 6. History of prior CVA-patient is on Xarelto, aspirin. 7. History of hypertension-blood pressure is elevated, continue metoprolol succinate 25 mg daily.  Will add amlodipine 5 mg daily. 8. Hypomagnesemia-magnesium checked today is 1.6, we will replace magnesium with 1 g magnesium sulfate IV x1 9. Hypokalemia-potassium is 3.4.  We will replace potassium today with K. Dur 40 mEq p.o. x1 before discharge.  Procedures:    Consultations:    Discharge Exam: Vitals:   06/14/20 2333 06/15/20 0405  BP: (!) 146/82 (!) 132/58  Pulse: 98 68  Resp: 20 16  Temp: 97.6 F (36.4 C) 98.2 F (36.8 C)  SpO2: 100% 98%    General: Appears in no acute distress Cardiovascular: S1-S2, regular Respiratory: Clear to auscultation bilaterally  Discharge Instructions   Discharge Instructions    Diet - low sodium heart healthy   Complete by: As directed    Increase activity slowly   Complete by: As directed      Allergies as of 06/15/2020   No Known Allergies     Medication List    STOP taking these medications   multivitamin with minerals Tabs tablet   vancomycin 125 MG capsule Commonly known as: VANCOCIN     TAKE these medications   amLODipine 5 MG tablet Commonly known as: NORVASC Take 1 tablet (5 mg total) by mouth daily. Start taking on: June 16, 2020  ascorbic acid 500 MG tablet Commonly known as: VITAMIN C Take 500 mg by mouth daily.   aspirin 81 MG chewable tablet Chew 2 tablets (162 mg total) by mouth every morning. What changed:   how much to take  when to take this   B-12 PO Take 1 tablet by mouth daily.   cetirizine 10 MG tablet Commonly  known as: ZYRTEC Take 10 mg by mouth in the morning.   fidaxomicin 200 MG Tabs tablet Commonly known as: DIFICID Take 1 tablet (200 mg total) by mouth 2 (two) times daily.   metoprolol succinate 25 MG 24 hr tablet Commonly known as: TOPROL-XL Take 1 tablet (25 mg total) by mouth daily. New blood pressure medicine.   mupirocin ointment 2 % Commonly known as: BACTROBAN Apply 1 application topically daily as needed (to lower abdominal area).   saccharomyces boulardii 250 MG capsule Commonly known as: FLORASTOR Take 1 capsule (250 mg total) by mouth 2 (two) times daily.   simvastatin 40 MG tablet Commonly known as: ZOCOR Take 1 tablet (40 mg total) by mouth daily at 6 PM.   VITAMIN D3 COMPLETE PO Take 1 tablet by mouth daily.   Xarelto 15 MG Tabs tablet Generic drug: Rivaroxaban Take 15 mg by mouth in the morning.            Durable Medical Equipment  (From admission, onward)         Start     Ordered   06/15/20 1036  For home use only DME Hospital bed  Once       Comments: Full rails for bed  Question Answer Comment  Length of Need Lifetime   Head must be elevated greater than: 30 degrees   Bed type Semi-electric      06/15/20 1035   06/15/20 1035  For home use only DME 3 n 1  Once        06/15/20 1035         No Known Allergies  Follow-up Information    [facility] Follow up.   Specialty: Home Health Services Why: The home health agency will contact you for the first home visit. Contact information: [place] [address] [phone]                The results of significant diagnostics from this hospitalization (including imaging, microbiology, ancillary and laboratory) are listed below for reference.    Significant Diagnostic Studies: CT HEAD WO CONTRAST  Result Date: 06/10/2020 CLINICAL DATA:  [place] EXAM: CT HEAD WITHOUT CONTRAST TECHNIQUE: Contiguous axial images were obtained from the base of the skull  through the vertex without intravenous contrast. COMPARISON:  February 05, 2016 FINDINGS: Brain: No evidence of acute infarction, hemorrhage, hydrocephalus, extra-axial collection or mass lesion/mass effect. Signs of prior infarct of the LEFT posterior frontal region similar to the study of 2017. Atrophy and white matter disease also similar. Similar appearance also in the LEFT occipital region. Signs of prior infarct in this area as seen on prior imaging. Vascular: No hyperdense vessel or unexpected calcification. Skull: Normal. Negative for fracture or focal lesion. Sinuses/Orbits: Visualized paranasal sinuses and orbits are unremarkable. Other: None IMPRESSION: 1. No acute intracranial pathology. 2. Signs of prior infarct of the LEFT posterior frontal region and LEFT parieto-occipital region similar to the study of 2017. 3. Atrophy and white matter disease. Electronically Signed   By: Zetta Bills M.D.   On: 06/10/2020 11:39   MR BRAIN WO CONTRAST  Result Date: 06/11/2020 CLINICAL DATA:  Initial evaluation for neuro deficit, stroke suspected, altered mental status. EXAM: MRI HEAD WITHOUT CONTRAST TECHNIQUE: Multiplanar, multiecho pulse sequences of the brain and surrounding structures were obtained without intravenous contrast. COMPARISON:  Prior CT from 06/10/2020. FINDINGS: Brain: Examination technically limited as the patient was unable to tolerate the full length of the exam. Diffusion-weighted imaging as well as axial FLAIR and gradient echo sequences only were performed. Additionally, images are somewhat degraded by motion artifact. Generalized age-related cerebral atrophy. Patchy T2/FLAIR hyperintensity within the periventricular white matter most consistent with chronic small vessel ischemic disease, mild for age. Scattered areas of encephalomalacia gliosis involving the left frontal parietal and occipital regions compatible with chronic ischemic changes, somewhat watershed in distribution. Minimal  associated chronic hemosiderin staining. No abnormal foci of restricted diffusion to suggest acute or subacute ischemia. Gray-white matter differentiation otherwise maintained. No evidence for acute intracranial hemorrhage. No mass lesion, midline shift or mass effect. No hydrocephalus or extra-axial fluid collection. Vascular: Normal vascular flow void seen throughout the anterior the intracranial circulation. Posterior circulation not well seen, likely diminutive/hypoplastic. Skull and upper cervical spine: Grossly unremarkable on this limited exam. Sinuses/Orbits: Globes and orbital soft tissues grossly within normal limits. Paranasal sinuses are largely clear. No significant mastoid effusion. Other: None. IMPRESSION: 1. Technically limited exam due to the patient's inability to tolerate the full length of the study. 2. No acute intracranial infarct or other abnormality. 3. Scattered areas of encephalomalacia involving the left frontal, parietal and occipital regions, compatible with chronic ischemic changes, watershed in distribution. 4. Underlying age-related cerebral atrophy with mild chronic small vessel ischemic disease. Electronically Signed   By: Jeannine Boga M.D.   On: 06/11/2020 01:58   DG Chest Port 1 View  Result Date: 06/10/2020 CLINICAL DATA:  Patient brought in via EMS from home. Alert. Patient oriented to person but disoriented to place, situation, and time. Patient being brought in for altered mental status. Per paramedic patient was last seen at her normal yesterday at 6pm when eating supper and noticed to have altered mental status this morning at 7am. EXAM: PORTABLE CHEST 1 VIEW COMPARISON:  04/13/2020 FINDINGS: Cardiac silhouette is enlarged but stable. No mediastinal or hilar masses. Densely calcified pleural plaques along the left mid to lower hemithorax, stable. No evidence of pneumonia or pulmonary edema. No pleural effusion or pneumothorax. Skeletal structures are  demineralized but grossly intact. IMPRESSION: 1. No acute cardiopulmonary disease. Stable appearance from the prior study Electronically Signed   By: Lajean Manes M.D.   On: 06/10/2020 11:24    Microbiology: Recent Results (from the past 240 hour(s))  SARS Coronavirus 2 by RT PCR (hospital order, performed in [facility] hospital lab) Nasopharyngeal Nasopharyngeal Swab     Status: None   Collection Time: 06/10/20  2:02 PM   Specimen: Nasopharyngeal Swab  Result Value Ref Range Status   SARS Coronavirus 2 NEGATIVE NEGATIVE Final    Comment: (NOTE) SARS-CoV-2 target nucleic acids are NOT DETECTED.  The SARS-CoV-2 RNA is generally detectable in upper and lower respiratory specimens during the acute phase of infection. The lowest concentration of SARS-CoV-2 viral copies this assay can detect is 250 copies / mL. A negative result does not preclude SARS-CoV-2 infection and should not be used as the sole basis for treatment or other patient management decisions.  A negative result may occur with improper specimen collection / handling, submission of specimen other than nasopharyngeal swab, presence of viral mutation(s) within the areas targeted  by this assay, and inadequate number of viral copies (<250 copies / mL). A negative result must be combined with clinical observations, patient history, and epidemiological information.  Fact Sheet for Patients:   StrictlyIdeas.no  Fact Sheet for Healthcare Providers: BankingDealers.co.za  This test is not yet approved or  cleared by the [place] FDA and has been authorized for detection and/or diagnosis of SARS-CoV-2 by FDA under an Emergency Use Authorization (EUA).  This EUA will remain in effect (meaning this test can be used) for the duration of the COVID-19 declaration under Section 564(b)(1) of the Act, 21 U.S.C. section 360bbb-3(b)(1), unless the authorization is terminated or revoked  sooner.  Performed at [facility], [address], [place], [place] [address]      Labs: Basic Metabolic Panel: Recent Labs  Lab 06/10/20 1259 06/12/20 0212 06/13/20 1017 06/14/20 0334 06/15/20 0244 06/15/20 0856  NA 137 135 137 136 139  --   K 3.9 3.3* 3.8 3.4* 3.4*  --   CL 98 100 101 99 101  --   CO2 27 27 26 26 26   --   GLUCOSE 119* 102* 149* 107* 107*  --   BUN 9 10 9 9 9   --   CREATININE 0.59 0.64 0.59 0.62 0.70  --   CALCIUM 9.2 8.7* 9.1 9.3 9.1  --   MG  --   --   --   --   --  1.6*   Liver Function Tests: Recent Labs  Lab 06/10/20 1259 06/12/20 0212 06/13/20 1017 06/14/20 0334 06/15/20 0244  AST 34 24 36 34 30  ALT 26 21 26 25 25   ALKPHOS 73 55 61 55 53  BILITOT 1.0 1.3* 1.2 1.5* 1.5*  PROT 6.5 5.6* 6.1* 6.1* 5.7*  ALBUMIN 3.9 3.3* 3.5 3.6 3.5   No results for input(s): LIPASE, AMYLASE in the last 168 hours. Recent Labs  Lab 06/10/20 1531  AMMONIA 9   CBC: Recent Labs  Lab 06/10/20 1104 06/12/20 0212 06/13/20 1017 06/14/20 0334 06/15/20 0244  WBC 5.9 5.5 6.5 7.4 6.4  HGB 13.9 12.8 14.0 14.0 13.6  HCT 43.8 38.9 42.7 42.8 40.3  MCV 93.6 88.4 88.4 88.4 88.2  PLT 106* 118* 133* 148* 146*       Signed:  Oswald Hillock MD.  [facility] 06/15/2020, 10:36 AM

## 2020-06-15 NOTE — TOC Transition Note (Signed)
Transition of Care [facility]) - CM/SW Discharge Note   Patient Details  Name: SHYANE FOSSUM MRN: [id number] Date of Birth: Dec 01, 1920  Transition of Care [facility]) CM/SW Contact:  Pollie Friar, RN Phone Number: 06/15/2020, 12:06 PM   Clinical Narrative:    Pt is discharging home with [facility] services through [facility]. Cassie with [facility] aware of d/c.  Family states bed and 3 in 1 are at the home.  Pt to receive Dificid to the home today per [facility].  Pt has transportation home.   Final next level of care: Home w Home Health Services Barriers to Discharge: No Barriers Identified   Patient Goals and CMS Choice   CMS Medicare.gov Compare Post Acute Care list provided to:: Patient Represenative (must comment) Frederik Pear) Choice offered to / list presented to : Adult Children  Discharge Placement                       Discharge Plan and Services     Post Acute Care Choice: Home Health          DME Arranged: 3-N-1 DME Agency: [employer] Date DME Agency Contacted: 06/14/20   Representative spoke with at DME Agency: [place]: PT, OT [place] Agency: [facility] [place] Date [place]: 06/15/20   Representative spoke with at [place]: Cassie  Social Determinants of Health ([place]) Interventions     Readmission Risk Interventions No flowsheet data found.

## 2020-06-16 DIAGNOSIS — I482 Chronic atrial fibrillation, unspecified: Secondary | ICD-10-CM | POA: Diagnosis not present

## 2020-06-16 DIAGNOSIS — A0471 Enterocolitis due to Clostridium difficile, recurrent: Secondary | ICD-10-CM | POA: Diagnosis not present

## 2020-06-16 DIAGNOSIS — G9341 Metabolic encephalopathy: Secondary | ICD-10-CM | POA: Diagnosis not present

## 2020-06-16 DIAGNOSIS — M6281 Muscle weakness (generalized): Secondary | ICD-10-CM | POA: Diagnosis not present

## 2020-06-16 DIAGNOSIS — I10 Essential (primary) hypertension: Secondary | ICD-10-CM | POA: Diagnosis not present

## 2020-06-16 DIAGNOSIS — I69351 Hemiplegia and hemiparesis following cerebral infarction affecting right dominant side: Secondary | ICD-10-CM | POA: Diagnosis not present

## 2020-06-26 DIAGNOSIS — A0472 Enterocolitis due to Clostridium difficile, not specified as recurrent: Secondary | ICD-10-CM | POA: Diagnosis not present

## 2020-06-26 DIAGNOSIS — R42 Dizziness and giddiness: Secondary | ICD-10-CM | POA: Diagnosis not present

## 2020-06-26 DIAGNOSIS — Z6823 Body mass index (BMI) 23.0-23.9, adult: Secondary | ICD-10-CM | POA: Diagnosis not present

## 2020-06-26 DIAGNOSIS — E876 Hypokalemia: Secondary | ICD-10-CM | POA: Diagnosis not present

## 2020-07-14 DIAGNOSIS — M6281 Muscle weakness (generalized): Secondary | ICD-10-CM | POA: Diagnosis not present

## 2020-07-14 DIAGNOSIS — A0472 Enterocolitis due to Clostridium difficile, not specified as recurrent: Secondary | ICD-10-CM | POA: Diagnosis not present

## 2020-07-14 DIAGNOSIS — I10 Essential (primary) hypertension: Secondary | ICD-10-CM | POA: Diagnosis not present

## 2020-07-14 DIAGNOSIS — I69351 Hemiplegia and hemiparesis following cerebral infarction affecting right dominant side: Secondary | ICD-10-CM | POA: Diagnosis not present

## 2020-07-14 DIAGNOSIS — I482 Chronic atrial fibrillation, unspecified: Secondary | ICD-10-CM | POA: Diagnosis not present

## 2020-07-14 DIAGNOSIS — G9341 Metabolic encephalopathy: Secondary | ICD-10-CM | POA: Diagnosis not present

## 2020-07-14 DIAGNOSIS — A0471 Enterocolitis due to Clostridium difficile, recurrent: Secondary | ICD-10-CM | POA: Diagnosis not present

## 2020-07-21 DIAGNOSIS — I69351 Hemiplegia and hemiparesis following cerebral infarction affecting right dominant side: Secondary | ICD-10-CM | POA: Diagnosis not present

## 2020-07-21 DIAGNOSIS — I482 Chronic atrial fibrillation, unspecified: Secondary | ICD-10-CM | POA: Diagnosis not present

## 2020-07-21 DIAGNOSIS — A0471 Enterocolitis due to Clostridium difficile, recurrent: Secondary | ICD-10-CM | POA: Diagnosis not present

## 2020-07-21 DIAGNOSIS — G9341 Metabolic encephalopathy: Secondary | ICD-10-CM | POA: Diagnosis not present

## 2020-07-24 DIAGNOSIS — D6942 Congenital and hereditary thrombocytopenia purpura: Secondary | ICD-10-CM | POA: Diagnosis not present

## 2020-08-10 ENCOUNTER — Encounter (HOSPITAL_COMMUNITY): Payer: Self-pay | Admitting: Hematology

## 2020-08-10 ENCOUNTER — Inpatient Hospital Stay (HOSPITAL_COMMUNITY): Payer: PPO | Attending: Hematology | Admitting: Hematology

## 2020-08-10 ENCOUNTER — Inpatient Hospital Stay (HOSPITAL_COMMUNITY): Payer: PPO

## 2020-08-10 ENCOUNTER — Other Ambulatory Visit: Payer: Self-pay

## 2020-08-10 VITALS — BP 147/80 | HR 54 | Temp 96.8°F | Resp 16 | Ht 63.0 in | Wt 133.3 lb

## 2020-08-10 DIAGNOSIS — D696 Thrombocytopenia, unspecified: Secondary | ICD-10-CM | POA: Insufficient documentation

## 2020-08-10 DIAGNOSIS — Z8673 Personal history of transient ischemic attack (TIA), and cerebral infarction without residual deficits: Secondary | ICD-10-CM | POA: Insufficient documentation

## 2020-08-10 DIAGNOSIS — E7849 Other hyperlipidemia: Secondary | ICD-10-CM | POA: Diagnosis not present

## 2020-08-10 DIAGNOSIS — I4891 Unspecified atrial fibrillation: Secondary | ICD-10-CM | POA: Diagnosis not present

## 2020-08-10 DIAGNOSIS — Z806 Family history of leukemia: Secondary | ICD-10-CM | POA: Diagnosis not present

## 2020-08-10 DIAGNOSIS — Z7901 Long term (current) use of anticoagulants: Secondary | ICD-10-CM | POA: Diagnosis not present

## 2020-08-10 DIAGNOSIS — Z809 Family history of malignant neoplasm, unspecified: Secondary | ICD-10-CM

## 2020-08-10 DIAGNOSIS — I10 Essential (primary) hypertension: Secondary | ICD-10-CM | POA: Diagnosis not present

## 2020-08-10 LAB — FOLATE: Folate: 82.1 ng/mL (ref >5.9)

## 2020-08-10 LAB — CBC WITH DIFFERENTIAL/PLATELET
Abs Immature Granulocytes: 0.02 10*3/uL (ref 0.00–0.07)
Basophils Absolute: 0 10*3/uL (ref 0.0–0.1)
Basophils Relative: 0 %
Eosinophils Absolute: 0.1 10*3/uL (ref 0.0–0.5)
Eosinophils Relative: 1 %
HCT: 41.2 % (ref 36.0–46.0)
Hemoglobin: 13 g/dL (ref 12.0–15.0)
Immature Granulocytes: 0 %
Lymphocytes Relative: 30 %
Lymphs Abs: 1.8 10*3/uL (ref 0.7–4.0)
MCH: 29.7 pg (ref 26.0–34.0)
MCHC: 31.6 g/dL (ref 30.0–36.0)
MCV: 94.3 fL (ref 80.0–100.0)
Monocytes Absolute: 0.7 10*3/uL (ref 0.1–1.0)
Monocytes Relative: 11 %
Neutro Abs: 3.6 10*3/uL (ref 1.7–7.7)
Neutrophils Relative %: 58 %
Platelets: 130 10*3/uL — ABNORMAL LOW (ref 150–400)
RBC: 4.37 MIL/uL (ref 3.87–5.11)
RDW: 14.6 % (ref 11.5–15.5)
WBC: 6.1 10*3/uL (ref 4.0–10.5)
nRBC: 0 % (ref 0.0–0.2)

## 2020-08-10 LAB — VITAMIN B12: Vitamin B-12: 6332 pg/mL — ABNORMAL HIGH (ref 180–914)

## 2020-08-10 LAB — LACTATE DEHYDROGENASE: LDH: 130 U/L (ref 98–192)

## 2020-08-10 NOTE — Patient Instructions (Signed)
[place] at [facility] Discharge Instructions  You were seen today by Dr. Delton Coombes. He went over your recent results. You had labs drawn today for additional analysis. Dr. Delton Coombes will contact you via telephone in 1 week for follow up.   Thank you for choosing [place] at [facility] to provide your oncology and hematology care.  To afford each patient quality time with our provider, please arrive at least 15 minutes before your scheduled appointment time.   If you have a lab appointment with the [facility] please come in thru the Main Entrance and check in at the main information desk  You need to re-schedule your appointment should you arrive 10 or more minutes late.  We strive to give you quality time with our providers, and arriving late affects you and other patients whose appointments are after yours.  Also, if you no show three or more times for appointments you may be dismissed from the clinic at the providers discretion.     Again, thank you for choosing [facility].  Our hope is that these requests will decrease the amount of time that you wait before being seen by our physicians.       _____________________________________________________________  Should you have questions after your visit to [facility], please contact our office at [phone] between the hours of 8:00 a.m. and 4:30 p.m.  Voicemails left after 4:00 p.m. will not be returned until the following business day.  For prescription refill requests, have your pharmacy contact our office and allow 72 hours.    Cancer Center Support Programs:   > Cancer Support Group  2nd Tuesday of the month 1pm-2pm, Journey Room

## 2020-08-10 NOTE — Progress Notes (Signed)
[place] [address] [place] [address]   CLINIC:  Medical Oncology/Hematology  Patient Care Team: Redmond School, MD as PCP - General (Internal Medicine)  CHIEF COMPLAINTS/PURPOSE OF CONSULTATION:  Evaluation of thrombocytopenia  HISTORY OF PRESENTING ILLNESS:  Kathryn Marquez 86 y.o. female is here because of evaluation of thrombocytopenia, at the request of Dr. Redmond School. Her platelets were 132 on 9/13 and 129 on 8/16.  Today she is accompanied by her daughter, Enid Derry. She has completed her vancomycin for C diff on 8/15 and is not taking anything else for the C diff. She is currently on Xarelto for a-fib, but denies having nose bleeds or hematuria or abnormal bleeding. According to the daughter, the pt was active and walking with a cane and was cooking on her own until she got the C diff. She has had 3 episodes of bright red blood in the past 1 month since she is constipated and has a history of hemorrhoids. Her appetite is good and she is eating everything. She has never had thrombocytopenia before, denies any previous blood transfusions. She had lost weight when she was hospitalized for C diff but her weight and strength are slowly increasing.  She lives at home with her daughter since 2013 after having 3 TIA's. She is a non-smoker. 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 Her sister had leukemia.   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 No pertinent surgical history.  SOCIAL HISTORY: Social History   Socioeconomic History   Marital status: Widowed    Spouse name: Not on file   Number of children: Not on file   Years of education: Not on file   Highest education level: Not on file  Occupational History   Not on file  Tobacco Use     Smoking status: Never Smoker   Smokeless tobacco: Never Used  Vaping Use   Vaping Use: Never used  Substance and Sexual Activity   Alcohol use: No   Drug use: No   Sexual activity: Not Currently  Other Topics Concern   Not on file  Social History Narrative   Not on file   Social Determinants of Health   Financial Resource Strain:    Difficulty of Paying Living Expenses: Not on file  Food Insecurity:    Worried About [place] in the Last Year: Not on file   Ran Out of Food in the Last Year: Not on file  Transportation Needs:    Lack of Transportation (Medical): Not on file   Lack of Transportation (Non-Medical): Not on file  Physical Activity:    Days of Exercise per Week: Not on file   Minutes of Exercise per Session: Not on file  Stress:    Feeling of Stress : Not on file  Social Connections:    Frequency of Communication with Friends and Family: Not on file   Frequency of Social Gatherings with Friends and Family: Not on file   Attends Religious Services: Not on file   Active Member of Clubs or Organizations: Not on file   Attends Archivist Meetings: Not on file   Marital Status: Not on file  Intimate Partner Violence:    Fear of Current or Ex-Partner: Not  on file   Emotionally Abused: Not on file   Physically Abused: Not on file   Sexually Abused: Not on file    FAMILY HISTORY: Family History  Problem Relation Age of Onset   Stroke Other    Cancer Other    Heart attack Mother    Fibromyalgia Mother    Hypercholesterolemia Mother    Leukemia Sister    Cancer Brother    Stroke Father    Heart attack Brother    Diabetes Son    Fibromyalgia Son    Hypertension Son     ALLERGIES:  has No Known Allergies.  MEDICATIONS:  Current Outpatient Medications  Medication Sig Dispense Refill   amLODipine (NORVASC) 5 MG tablet Take 1 tablet (5 mg total) by mouth daily. (Patient taking differently: Take  2.5 mg by mouth daily. ) 30 tablet 2   ascorbic acid (VITAMIN C) 500 MG tablet Take 500 mg by mouth daily.     aspirin 81 MG chewable tablet Chew 2 tablets (162 mg total) by mouth every morning. (Patient taking differently: Chew 81 mg by mouth every evening. )     calcium-vitamin D (OSCAL WITH D) 500-200 MG-UNIT tablet Take 1 tablet by mouth daily with breakfast.     cetirizine (ZYRTEC) 10 MG tablet Take 10 mg by mouth in the morning.      Cyanocobalamin (B-12 PO) Take 1 tablet by mouth daily.     metoprolol succinate (TOPROL-XL) 25 MG 24 hr tablet Take 1 tablet (25 mg total) by mouth daily. New blood pressure medicine. 30 tablet 2   Multiple Vitamins-Minerals (VITAMIN D3 COMPLETE PO) Take 1 tablet by mouth daily.     saccharomyces boulardii (FLORASTOR) 250 MG capsule Take 250 mg by mouth 2 (two) times daily.     XARELTO 15 MG TABS tablet Take 15 mg by mouth in the morning.      mupirocin ointment (BACTROBAN) 2 % Apply 1 application topically daily as needed (to lower abdominal area).  (Patient not taking: Reported on 08/10/2020)     No current facility-administered medications for this visit.    REVIEW OF SYSTEMS:   Review of Systems  Constitutional: Positive for appetite change (75%) and fatigue (75%).  HENT:   Negative for nosebleeds.   Cardiovascular: Negative for leg swelling.  Gastrointestinal: Positive for blood in stool (x3 episodes in past 1 month).  Genitourinary: Negative for hematuria.   Neurological: Positive for dizziness.  All other systems reviewed and are negative.    PHYSICAL EXAMINATION: ECOG PERFORMANCE STATUS: 2 - Symptomatic, <50% confined to bed  Vitals:   08/10/20 1326  BP: (!) 147/80  Pulse: (!) 54  Resp: 16  Temp: (!) 96.8 F (36 C)  SpO2: 99%   Filed Weights   08/10/20 1326  Weight: 133 lb 4.8 oz (60.5 kg)   Physical Exam Vitals reviewed.  Constitutional:      Appearance: Normal appearance.  Cardiovascular:     Rate and Rhythm:  Normal rate and regular rhythm.     Pulses: Normal pulses.     Heart sounds: Normal heart sounds.  Pulmonary:     Effort: Pulmonary effort is normal.     Breath sounds: Normal breath sounds.  Abdominal:     Palpations: Abdomen is soft. There is no mass.     Tenderness: There is no abdominal tenderness.     Hernia: No hernia is present.  Musculoskeletal:     Right lower leg: No edema.  Left lower leg: No edema.  Lymphadenopathy:     Upper Body:     Right upper body: No supraclavicular, axillary or pectoral adenopathy.     Left upper body: No supraclavicular, axillary or pectoral adenopathy.  Neurological:     General: No focal deficit present.     Mental Status: She is alert and oriented to person, place, and time.  Psychiatric:        Mood and Affect: Mood normal.        Behavior: Behavior normal.      LABORATORY DATA:  I have reviewed the data as listed Recent Results (from the past 2160 hour(s))  Urinalysis, Routine w reflex microscopic     Status: Abnormal   Collection Time: 06/10/20 10:41 AM  Result Value Ref Range   Color, Urine YELLOW YELLOW   APPearance CLOUDY (A) CLEAR   Specific Gravity, Urine 1.009 1.005 - 1.030   pH 9.0 (H) 5.0 - 8.0   Glucose, UA NEGATIVE NEGATIVE mg/dL   Hgb urine dipstick NEGATIVE NEGATIVE   Bilirubin Urine NEGATIVE NEGATIVE   Ketones, ur NEGATIVE NEGATIVE mg/dL   Protein, ur NEGATIVE NEGATIVE mg/dL   Nitrite NEGATIVE NEGATIVE   Leukocytes,Ua NEGATIVE NEGATIVE    Comment: Performed at [facility], [address], [place], [place] [address]  CBC     Status: Abnormal   Collection Time: 06/10/20 11:04 AM  Result Value Ref Range   WBC 5.9 4.0 - 10.5 K/uL   RBC 4.68 3.87 - 5.11 MIL/uL   Hemoglobin 13.9 12.0 - 15.0 g/dL   HCT 43.8 36 - 46 %   MCV 93.6 80.0 - 100.0 fL   MCH 29.7 26.0 - 34.0 pg   MCHC 31.7 30.0 - 36.0 g/dL   RDW 15.3 11.5 - 15.5 %   Platelets 106 (L) 150 - 400 K/uL    Comment: SPECIMEN CHECKED FOR CLOTS CONSISTENT  WITH PREVIOUS RESULT    nRBC 0.0 0.0 - 0.2 %    Comment: Performed at [facility], [address] [place], [place] [address]  Troponin I (High Sensitivity)     Status: None   Collection Time: 06/10/20 12:49 PM  Result Value Ref Range   Troponin I (High Sensitivity) 4 <18 ng/L    Comment: (NOTE) Elevated high sensitivity troponin I (hsTnI) values and significant  changes across serial measurements may suggest ACS but many other  chronic and acute conditions are known to elevate hsTnI results.  Refer to the "Links" section for chest pain algorithms and additional  guidance. Performed at [facility], [address], [place], [place] [address]   Comprehensive metabolic panel     Status: Abnormal   Collection Time: 06/10/20 12:59 PM  Result Value Ref Range   Sodium 137 135 - 145 mmol/L   Potassium 3.9 3.5 - 5.1 mmol/L   Chloride 98 98 - 111 mmol/L   CO2 27 22 - 32 mmol/L   Glucose, Bld 119 (H) 70 - 99 mg/dL    Comment: Glucose reference range applies only to samples taken after fasting for at least 8 hours.   BUN 9 8 - 23 mg/dL   Creatinine, Ser 0.59 0.44 - 1.00 mg/dL   Calcium 9.2 8.9 - 10.3 mg/dL   Total Protein 6.5 6.5 - 8.1 g/dL   Albumin 3.9 3.5 - 5.0 g/dL   AST 34 15 - 41 U/L   ALT 26 0 - 44 U/L   Alkaline Phosphatase 73 38 - 126 U/L  Total Bilirubin 1.0 0.3 - 1.2 mg/dL   GFR calc non Af Amer >60 >60 mL/min   GFR calc Af Amer >60 >60 mL/min   Anion gap 12 5 - 15    Comment: Performed at [facility], [address], [place], [place] [address]  SARS Coronavirus 2 by RT PCR (hospital order, performed in [facility] hospital lab) Nasopharyngeal Nasopharyngeal Swab     Status: None   Collection Time: 06/10/20  2:02 PM   Specimen: Nasopharyngeal Swab  Result Value Ref Range   SARS Coronavirus 2 NEGATIVE NEGATIVE    Comment: (NOTE) SARS-CoV-2 target nucleic acids are NOT DETECTED.  The SARS-CoV-2 RNA is generally detectable in upper and lower respiratory specimens during  the acute phase of infection. The lowest concentration of SARS-CoV-2 viral copies this assay can detect is 250 copies / mL. A negative result does not preclude SARS-CoV-2 infection and should not be used as the sole basis for treatment or other patient management decisions.  A negative result may occur with improper specimen collection / handling, submission of specimen other than nasopharyngeal swab, presence of viral mutation(s) within the areas targeted by this assay, and inadequate number of viral copies (<250 copies / mL). A negative result must be combined with clinical observations, patient history, and epidemiological information.  Fact Sheet for Patients:   StrictlyIdeas.no  Fact Sheet for Healthcare Providers: BankingDealers.co.za  This test is not yet approved or  cleared by the [place] FDA and has been authorized for detection and/or diagnosis of SARS-CoV-2 by FDA under an Emergency Use Authorization (EUA).  This EUA will remain in effect (meaning this test can be used) for the duration of the COVID-19 declaration under Section 564(b)(1) of the Act, 21 U.S.C. section 360bbb-3(b)(1), unless the authorization is terminated or revoked sooner.  Performed at [facility], [address] [place], [place] [address]   Ammonia     Status: None   Collection Time: 06/10/20  3:31 PM  Result Value Ref Range   Ammonia 9 9 - 35 umol/L    Comment: Performed at [facility], [address] [place], [place] [address]  Blood gas, venous     Status: Abnormal   Collection Time: 06/10/20  3:31 PM  Result Value Ref Range   FIO2 21.00    pH, Ven 7.352 7.25 - 7.43   pCO2, Ven 60.3 (H) 44 - 60 mmHg   pO2, Ven <31.0 (LL) 32 - 45 mmHg    Comment: CRITICAL RESULT CALLED TO, READ BACK BY AND VERIFIED WITH: [place] @ 1622 ON [id number] BY HENDERSON L.    Bicarbonate 27.7 20.0 - 28.0 mmol/L   Acid-Base Excess 7.1 (H) 0.0 - 2.0 mmol/L   O2  Saturation 26.1 %   Patient temperature 36.2     Comment: Performed at [facility], [address], [place], [place] [address]  CBC     Status: Abnormal   Collection Time: 06/12/20  2:12 AM  Result Value Ref Range   WBC 5.5 4.0 - 10.5 K/uL   RBC 4.40 3.87 - 5.11 MIL/uL   Hemoglobin 12.8 12.0 - 15.0 g/dL   HCT 38.9 36 - 46 %   MCV 88.4 80.0 - 100.0 fL   MCH 29.1 26.0 - 34.0 pg   MCHC 32.9 30.0 - 36.0 g/dL   RDW 15.0 11.5 - 15.5 %   Platelets 118 (L) 150 - 400 K/uL    Comment: REPEATED TO VERIFY PLATELET COUNT CONFIRMED BY SMEAR SPECIMEN CHECKED  FOR CLOTS Immature Platelet Fraction may be clinically indicated, consider ordering this additional test [id number]    nRBC 0.0 0.0 - 0.2 %    Comment: Performed at [facility], [place] [address] [place], [place] [address]  Comprehensive metabolic panel     Status: Abnormal   Collection Time: 06/12/20  2:12 AM  Result Value Ref Range   Sodium 135 135 - 145 mmol/L   Potassium 3.3 (L) 3.5 - 5.1 mmol/L   Chloride 100 98 - 111 mmol/L   CO2 27 22 - 32 mmol/L   Glucose, Bld 102 (H) 70 - 99 mg/dL    Comment: Glucose reference range applies only to samples taken after fasting for at least 8 hours.   BUN 10 8 - 23 mg/dL   Creatinine, Ser 0.64 0.44 - 1.00 mg/dL   Calcium 8.7 (L) 8.9 - 10.3 mg/dL   Total Protein 5.6 (L) 6.5 - 8.1 g/dL   Albumin 3.3 (L) 3.5 - 5.0 g/dL   AST 24 15 - 41 U/L   ALT 21 0 - 44 U/L   Alkaline Phosphatase 55 38 - 126 U/L   Total Bilirubin 1.3 (H) 0.3 - 1.2 mg/dL   GFR calc non Af Amer >60 >60 mL/min   GFR calc Af Amer >60 >60 mL/min   Anion gap 8 5 - 15    Comment: Performed at [facility] [address] [place], [place] [address]  CBC     Status: Abnormal   Collection Time: 06/13/20 10:17 AM  Result Value Ref Range   WBC 6.5 4.0 - 10.5 K/uL   RBC 4.83 3.87 - 5.11 MIL/uL   Hemoglobin 14.0 12.0 - 15.0 g/dL   HCT 42.7 36 - 46 %   MCV 88.4 80.0 - 100.0 fL   MCH 29.0 26.0 - 34.0 pg   MCHC 32.8 30.0 -  36.0 g/dL   RDW 14.8 11.5 - 15.5 %   Platelets 133 (L) 150 - 400 K/uL    Comment: REPEATED TO VERIFY   nRBC 0.0 0.0 - 0.2 %    Comment: Performed at [facility], [place] [address] [place], [place] [address]  Comprehensive metabolic panel     Status: Abnormal   Collection Time: 06/13/20 10:17 AM  Result Value Ref Range   Sodium 137 135 - 145 mmol/L   Potassium 3.8 3.5 - 5.1 mmol/L   Chloride 101 98 - 111 mmol/L   CO2 26 22 - 32 mmol/L   Glucose, Bld 149 (H) 70 - 99 mg/dL    Comment: Glucose reference range applies only to samples taken after fasting for at least 8 hours.   BUN 9 8 - 23 mg/dL   Creatinine, Ser 0.59 0.44 - 1.00 mg/dL   Calcium 9.1 8.9 - 10.3 mg/dL   Total Protein 6.1 (L) 6.5 - 8.1 g/dL   Albumin 3.5 3.5 - 5.0 g/dL   AST 36 15 - 41 U/L   ALT 26 0 - 44 U/L   Alkaline Phosphatase 61 38 - 126 U/L   Total Bilirubin 1.2 0.3 - 1.2 mg/dL   GFR calc non Af Amer >60 >60 mL/min   GFR calc Af Amer >60 >60 mL/min   Anion gap 10 5 - 15    Comment: Performed at [facility], [place] [address] [place], [place] [address]  CBC     Status: Abnormal   Collection Time: 06/14/20  3:34 AM  Result Value Ref  Range   WBC 7.4 4.0 - 10.5 K/uL   RBC 4.84 3.87 - 5.11 MIL/uL   Hemoglobin 14.0 12.0 - 15.0 g/dL   HCT 42.8 36 - 46 %   MCV 88.4 80.0 - 100.0 fL   MCH 28.9 26.0 - 34.0 pg   MCHC 32.7 30.0 - 36.0 g/dL   RDW 15.0 11.5 - 15.5 %   Platelets 148 (L) 150 - 400 K/uL    Comment: REPEATED TO VERIFY   nRBC 0.0 0.0 - 0.2 %    Comment: Performed at [facility], [place] [address] [place], [place] [address]  Comprehensive metabolic panel     Status: Abnormal   Collection Time: 06/14/20  3:34 AM  Result Value Ref Range   Sodium 136 135 - 145 mmol/L   Potassium 3.4 (L) 3.5 - 5.1 mmol/L   Chloride 99 98 - 111 mmol/L   CO2 26 22 - 32 mmol/L   Glucose, Bld 107 (H) 70 - 99 mg/dL    Comment: Glucose reference range applies only to samples taken after fasting for at least 8 hours.    BUN 9 8 - 23 mg/dL   Creatinine, Ser 0.62 0.44 - 1.00 mg/dL   Calcium 9.3 8.9 - 10.3 mg/dL   Total Protein 6.1 (L) 6.5 - 8.1 g/dL   Albumin 3.6 3.5 - 5.0 g/dL   AST 34 15 - 41 U/L   ALT 25 0 - 44 U/L   Alkaline Phosphatase 55 38 - 126 U/L   Total Bilirubin 1.5 (H) 0.3 - 1.2 mg/dL   GFR calc non Af Amer >60 >60 mL/min   GFR calc Af Amer >60 >60 mL/min   Anion gap 11 5 - 15    Comment: Performed at [facility], [place] [address], [place], [place] [address]  CBC     Status: Abnormal   Collection Time: 06/15/20  2:44 AM  Result Value Ref Range   WBC 6.4 4.0 - 10.5 K/uL   RBC 4.57 3.87 - 5.11 MIL/uL   Hemoglobin 13.6 12.0 - 15.0 g/dL   HCT 40.3 36 - 46 %   MCV 88.2 80.0 - 100.0 fL   MCH 29.8 26.0 - 34.0 pg   MCHC 33.7 30.0 - 36.0 g/dL   RDW 14.9 11.5 - 15.5 %   Platelets 146 (L) 150 - 400 K/uL   nRBC 0.0 0.0 - 0.2 %    Comment: Performed at [facility], [place] [address] [place], [place] [address]  Comprehensive metabolic panel     Status: Abnormal   Collection Time: 06/15/20  2:44 AM  Result Value Ref Range   Sodium 139 135 - 145 mmol/L   Potassium 3.4 (L) 3.5 - 5.1 mmol/L   Chloride 101 98 - 111 mmol/L   CO2 26 22 - 32 mmol/L   Glucose, Bld 107 (H) 70 - 99 mg/dL    Comment: Glucose reference range applies only to samples taken after fasting for at least 8 hours.   BUN 9 8 - 23 mg/dL   Creatinine, Ser 0.70 0.44 - 1.00 mg/dL   Calcium 9.1 8.9 - 10.3 mg/dL   Total Protein 5.7 (L) 6.5 - 8.1 g/dL   Albumin 3.5 3.5 - 5.0 g/dL   AST 30 15 - 41 U/L   ALT 25 0 - 44 U/L   Alkaline Phosphatase 53 38 - 126 U/L   Total Bilirubin 1.5 (H) 0.3 - 1.2 mg/dL   GFR calc  non Af Amer >60 >60 mL/min   GFR calc Af Amer >60 >60 mL/min   Anion gap 12 5 - 15    Comment: Performed at [facility] [address] [place], [place] [address]  Magnesium     Status: Abnormal   Collection Time: 06/15/20  8:56 AM  Result Value Ref Range   Magnesium 1.6 (L) 1.7 - 2.4 mg/dL     Comment: Performed at [facility] [address], [place], [place] [address]    RADIOGRAPHIC STUDIES: I have personally reviewed the radiological images as listed and agreed with the findings in the report.  ASSESSMENT:  1.  Mild thrombocytopenia: -Patient seen at the request of Dr. Gerarda Fraction for thrombocytopenia. -CBC on 06/26/2020 shows platelet count 129 with normal white count and hemoglobin. -CBC on 07/24/2020 shows platelet count 132 with normal white count and hemoglobin. -She was recently hospitalized for recurrent C. difficile infection.  Her platelet count went down to as low as 75 during hospitalization. -Denies any easy bruising.  No prior history of blood transfusions.  2.  Social/family history: -She lives at home.  Daughter lives with her and helps her. -She is able to do her ADLs. -Never smoker.  Sister had leukemia.    PLAN:  1.  Mild thrombocytopenia: -We will repeat platelet count today.  We will check for nutritional deficiencies including folic acid, copper, methylmalonic acid and B12.  We will also check for SPEP. -Differential diagnosis includes immune thrombocytopenia versus MDS. -Given the degree of thrombocytopenia, she is not at high risk for any bleeding.  2.  Atrial fibrillation: -Continue Xarelto.  No bleeding issues.  All questions were answered. The patient knows to call the clinic with any problems, questions or concerns.  Derek Jack, MD 08/10/20 2:04 PM  [place] [phone]   I, Milinda Antis, am acting as a scribe for Dr. Sanda Linger.  I, Derek Jack MD, have reviewed the above documentation for accuracy and completeness, and I agree with the above.

## 2020-08-11 LAB — PROTEIN ELECTROPHORESIS, SERUM
A/G Ratio: 1.4 (ref 0.7–1.7)
Albumin ELP: 3.9 g/dL (ref 2.9–4.4)
Alpha-1-Globulin: 0.3 g/dL (ref 0.0–0.4)
Alpha-2-Globulin: 0.7 g/dL (ref 0.4–1.0)
Beta Globulin: 0.9 g/dL (ref 0.7–1.3)
Gamma Globulin: 0.8 g/dL (ref 0.4–1.8)
Globulin, Total: 2.7 g/dL (ref 2.2–3.9)
Total Protein ELP: 6.6 g/dL (ref 6.0–8.5)

## 2020-08-13 LAB — COPPER, SERUM: Copper: 112 ug/dL (ref 80–158)

## 2020-08-16 LAB — METHYLMALONIC ACID, SERUM: Methylmalonic Acid, Quantitative: 173 nmol/L (ref 0–378)

## 2020-08-21 ENCOUNTER — Other Ambulatory Visit: Payer: Self-pay

## 2020-08-21 ENCOUNTER — Inpatient Hospital Stay (HOSPITAL_COMMUNITY): Payer: PPO | Attending: Hematology | Admitting: Hematology

## 2020-08-21 DIAGNOSIS — D696 Thrombocytopenia, unspecified: Secondary | ICD-10-CM

## 2020-08-21 NOTE — Progress Notes (Signed)
Virtual Visit via Telephone Note  I connected with Kathryn Marquez on 08/21/20 at  4:15 PM EDT by telephone and verified that I am speaking with the correct person using two identifiers.   I discussed the limitations, risks, security and privacy concerns of performing an evaluation and management service by telephone and the availability of in person appointments. I also discussed with the patient that there may be a patient responsible charge related to this service. The patient expressed understanding and agreed to proceed.   History of Present Illness: She was evaluated in our clinic for thrombocytopenia which was mild.  She lives at home and her daughter lives with her and helps her.  She is able to do all her day-to-day activities.  She is a never smoker.  Family history significant for sister with leukemia.   Observations/Objective: I have talked to her daughter over the phone.  According to her, her mother is doing very well and is able to do all her ADLs and some of her IADLs.  Denies any falls recently.  Denies any bleeding issues although she has some easy bruising on the upper extremities.  Assessment and Plan:  1.  Mild thrombocytopenia: -CBC on 06/26/2020 with platelet count 129 -CBC on 07/24/2020 with platelet count 132 -CBC on 08/10/2020 with platelet count 130, normal hemoglobin and white count. -SPEP is negative.  Nutritional deficiency work-up shows normal ferritin, B12, methylmalonic acid, copper and folic acid levels. -Differential diagnosis of mild thrombocytopenia is immune mediated thrombocytopenia versus mild MDS. -As she has only mild thrombocytopenia, no further work-up is needed at this time. -RTC 1 year for follow-up with labs.  She was told to come back sooner should she develop any active bleeding.  2.  Atrial fibrillation: -Continue Xarelto. -Continue follow-up with Dr. Gerarda Fraction.   Follow Up Instructions: RTC 1 year with labs.   I discussed the assessment and  treatment plan with the patient. The patient was provided an opportunity to ask questions and all were answered. The patient agreed with the plan and demonstrated an understanding of the instructions.   The patient was advised to call back or seek an in-person evaluation if the symptoms worsen or if the condition fails to improve as anticipated.  I provided 11 minutes of non-face-to-face time during this encounter.   Derek Jack, MD

## 2020-08-22 DIAGNOSIS — L570 Actinic keratosis: Secondary | ICD-10-CM | POA: Diagnosis not present

## 2020-09-09 DIAGNOSIS — I4891 Unspecified atrial fibrillation: Secondary | ICD-10-CM | POA: Diagnosis not present

## 2020-09-09 DIAGNOSIS — E7849 Other hyperlipidemia: Secondary | ICD-10-CM | POA: Diagnosis not present

## 2020-09-09 DIAGNOSIS — I10 Essential (primary) hypertension: Secondary | ICD-10-CM | POA: Diagnosis not present

## 2020-11-10 DIAGNOSIS — I4891 Unspecified atrial fibrillation: Secondary | ICD-10-CM | POA: Diagnosis not present

## 2020-11-10 DIAGNOSIS — E7849 Other hyperlipidemia: Secondary | ICD-10-CM | POA: Diagnosis not present

## 2020-11-10 DIAGNOSIS — I10 Essential (primary) hypertension: Secondary | ICD-10-CM | POA: Diagnosis not present

## 2020-11-15 DIAGNOSIS — R0600 Dyspnea, unspecified: Secondary | ICD-10-CM | POA: Diagnosis not present

## 2020-11-15 DIAGNOSIS — F418 Other specified anxiety disorders: Secondary | ICD-10-CM | POA: Diagnosis not present

## 2020-11-15 DIAGNOSIS — R600 Localized edema: Secondary | ICD-10-CM | POA: Diagnosis not present

## 2020-11-15 DIAGNOSIS — Z681 Body mass index (BMI) 19 or less, adult: Secondary | ICD-10-CM | POA: Diagnosis not present

## 2020-12-04 ENCOUNTER — Other Ambulatory Visit: Payer: Self-pay

## 2020-12-04 ENCOUNTER — Encounter (HOSPITAL_COMMUNITY): Payer: Self-pay | Admitting: *Deleted

## 2020-12-04 ENCOUNTER — Emergency Department (HOSPITAL_COMMUNITY)
Admission: EM | Admit: 2020-12-04 | Discharge: 2020-12-04 | Disposition: A | Discharge status: Home / Self Care | Payer: PPO | Attending: Emergency Medicine | Admitting: Emergency Medicine

## 2020-12-04 ENCOUNTER — Emergency Department (HOSPITAL_COMMUNITY): Payer: PPO

## 2020-12-04 DIAGNOSIS — J90 Pleural effusion, not elsewhere classified: Secondary | ICD-10-CM

## 2020-12-04 DIAGNOSIS — I509 Heart failure, unspecified: Secondary | ICD-10-CM | POA: Diagnosis not present

## 2020-12-04 DIAGNOSIS — Z20822 Contact with and (suspected) exposure to covid-19: Secondary | ICD-10-CM | POA: Insufficient documentation

## 2020-12-04 DIAGNOSIS — R0602 Shortness of breath: Secondary | ICD-10-CM

## 2020-12-04 DIAGNOSIS — I10 Essential (primary) hypertension: Secondary | ICD-10-CM | POA: Insufficient documentation

## 2020-12-04 DIAGNOSIS — M7989 Other specified soft tissue disorders: Secondary | ICD-10-CM | POA: Diagnosis not present

## 2020-12-04 DIAGNOSIS — I517 Cardiomegaly: Secondary | ICD-10-CM | POA: Diagnosis not present

## 2020-12-04 LAB — BASIC METABOLIC PANEL
Anion gap: 10 (ref 5–15)
BUN: 12 mg/dL (ref 8–23)
CO2: 30 mmol/L (ref 22–32)
Calcium: 9.5 mg/dL (ref 8.9–10.3)
Chloride: 94 mmol/L — ABNORMAL LOW (ref 98–111)
Creatinine, Ser: 0.65 mg/dL (ref 0.44–1.00)
GFR, Estimated: >60 mL/min (ref >60)
Glucose, Bld: 103 mg/dL — ABNORMAL HIGH (ref 70–99)
Potassium: 4 mmol/L (ref 3.5–5.1)
Sodium: 134 mmol/L — ABNORMAL LOW (ref 135–145)

## 2020-12-04 LAB — CBC
HCT: 42.2 % (ref 36.0–46.0)
Hemoglobin: 13.2 g/dL (ref 12.0–15.0)
MCH: 29.4 pg (ref 26.0–34.0)
MCHC: 31.3 g/dL (ref 30.0–36.0)
MCV: 94 fL (ref 80.0–100.0)
Platelets: 123 10*3/uL — ABNORMAL LOW (ref 150–400)
RBC: 4.49 MIL/uL (ref 3.87–5.11)
RDW: 14.9 % (ref 11.5–15.5)
WBC: 4.8 10*3/uL (ref 4.0–10.5)
nRBC: 0 % (ref 0.0–0.2)

## 2020-12-04 LAB — TROPONIN I (HIGH SENSITIVITY)
Troponin I (High Sensitivity): 6 ng/L (ref <18)
Troponin I (High Sensitivity): 6 ng/L (ref <18)

## 2020-12-04 LAB — SARS CORONAVIRUS 2 BY RT PCR (HOSPITAL ORDER, PERFORMED IN ~~LOC~~ HOSPITAL LAB): SARS Coronavirus 2: NEGATIVE

## 2020-12-04 LAB — BRAIN NATRIURETIC PEPTIDE: B Natriuretic Peptide: 22 pg/mL (ref 0.0–100.0)

## 2020-12-04 MED ORDER — FUROSEMIDE 40 MG PO TABS
20.0000 mg | ORAL_TABLET | Freq: Once | ORAL | Status: AC
  Administered 2020-12-04: 20 mg via ORAL
  Filled 2020-12-04: qty 1

## 2020-12-04 MED ORDER — FUROSEMIDE 20 MG PO TABS: 20.0000 mg | ORAL_TABLET | Freq: Every day | ORAL | 0 refills | Status: DC

## 2020-12-04 NOTE — ED Triage Notes (Signed)
Chronic shortness of breath progressively getting worse. swelling of extremities for the past week

## 2020-12-04 NOTE — Discharge Instructions (Signed)
You were seen in the emergency department today with shortness of breath.  You appear to have a small amount of fluid on your lungs which may be causing your symptoms.  Your remaining tests including Covid test were negative.  We discussed treatment options including hospitalization but you are overall doing well and not requiring oxygen.  We will give 5 days of Lasix starting tonight.  You will need to call your primary care doctor first thing tomorrow morning to schedule a follow-up appointment in the next week.  They can repeat blood work and decide if they will need to continue Lasix and possibly schedule an outpatient ECHO.   If you develop any new or suddenly worsening symptoms please return to the emergency department immediately and/or call 911.

## 2020-12-04 NOTE — ED Provider Notes (Signed)
Emergency Department Provider Note   I have reviewed the triage vital signs and the nursing notes.   HISTORY  Chief Complaint Shortness of Breath   HPI Kathryn Marquez is a 87 y.o. female with past medical history reviewed below including chronic A. fib, hypertension, prior stroke, presents to the emergency department with progressive shortness of breath over the past week.  Patient has had increased swelling in the bilateral lower extremities with "restless" feeling in the legs at night.  Patient's daughter is at bedside who is with the patient 24 hours/day.  She states that shortness of breath symptoms seem to be worse at night.  Patient sometimes wakes up at night feeling short of breath and has improvement with sitting up and having the daughter Fraser Din her back.  She was given an albuterol inhaler which occasionally helps her symptoms as well.  She does not have fever or chills.  She denies any chest pain.  No abdominal pain or GI symptoms. No radiation of symptoms or modifying factors.   Past Medical History:  Diagnosis Date  . Acute embolic stroke ([place]) 9/76/7341  . Dyslipidemia, goal LDL below 100 04/30/2012  . Dysrhythmia    Chronic atrial fib  . Hypertension   . Irregular heartbeat     Patient Active Problem List   Diagnosis Date Noted  . Recurrent Clostridioides difficile diarrhea 06/11/2020  . Acute metabolic encephalopathy 93/79/0240  . Dehydration 05/09/2020  . Colitis 05/08/2020  . C. difficile colitis 04/15/2020  . Hypokalemia 04/14/2020  . Abdominal pain 04/14/2020  . Acute diverticulitis 04/13/2020  . Head injury 02/05/2016  . Fall involving sidewalk curb 02/05/2016  . Facial trauma 02/05/2016  . Facial bruising 02/05/2016  . Fall 02/05/2016  . Dyslipidemia, goal LDL below 100 04/30/2012  . TIA (transient ischemic attack) 04/29/2012  . 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 Hypertension 04/29/2012  . Chronic atrial fibrillation ([place])  04/29/2012  . CLOSED COLLES FRACTURE 08/02/2010    History reviewed. No pertinent surgical history.  Allergies Patient has no known allergies.  Family History  Problem Relation Age of Onset  . Stroke Other   . Cancer Other   . Heart attack Mother   . Fibromyalgia Mother   . Hypercholesterolemia Mother   . Leukemia Sister   . Cancer Brother   . Stroke Father   . Heart attack Brother   . Diabetes Son   . Fibromyalgia Son   . Hypertension Son     Social History Social History   Tobacco Use  . Smoking status: Never Smoker  . Smokeless tobacco: Never Used  Vaping Use  . Vaping Use: Never used  Substance Use Topics  . Alcohol use: No  . Drug use: No    Review of Systems  Constitutional: No fever/chills Eyes: No visual changes. ENT: No sore throat. Cardiovascular: Denies chest pain. Positive B/L LE swelling.  Respiratory: Positive shortness of breath. Gastrointestinal: No abdominal pain.  No nausea, no vomiting.  No diarrhea.  No constipation. Genitourinary: Negative for dysuria. Musculoskeletal: Negative for back pain. Skin: Negative for rash. Neurological: Negative for headaches, focal weakness or numbness.  10-point ROS otherwise negative.  ____________________________________________   PHYSICAL EXAM:  VITAL SIGNS: ED Triage Vitals  Enc Vitals Group     BP 12/04/20 1407 (!) 173/98     Pulse Rate 12/04/20 1407 84     Resp 12/04/20 1407 18     Temp 12/04/20 1407 98.2 F (36.8 C)  Temp Source 12/04/20 1407 Oral     SpO2 12/04/20 1407 95 %     Weight 12/04/20 1407 140 lb (63.5 kg)     Height 12/04/20 1407 5\' 4"  (1.626 m)   Constitutional: Alert and oriented. Well appearing and in no acute distress. Eyes: Conjunctivae are normal.  Head: Atraumatic. Nose: No congestion/rhinnorhea. Mouth/Throat: Mucous membranes are moist.  Neck: No stridor.   Cardiovascular: Normal rate, regular rhythm. Good peripheral circulation. Grossly normal heart sounds.    Respiratory: Normal respiratory effort.  No retractions. Lungs diminished at the bases. No wheezing or rhonchi.  Gastrointestinal: Soft and nontender. No distention.  Musculoskeletal: No lower extremity tenderness with 1+ pitting edema. No gross deformities of extremities. Neurologic:  Normal speech and language. No gross focal neurologic deficits are appreciated.  Skin:  Skin is warm, dry and intact. No rash noted.  ____________________________________________   LABS (all labs ordered are listed, but only abnormal results are displayed)  Labs Reviewed  BASIC METABOLIC PANEL - Abnormal; Notable for the following components:      Result Value   Sodium 134 (*)    Chloride 94 (*)    Glucose, Bld 103 (*)    All other components within normal limits  CBC - Abnormal; Notable for the following components:   Platelets 123 (*)    All other components within normal limits  SARS CORONAVIRUS 2 BY RT PCR (HOSPITAL ORDER, [facility])  BRAIN NATRIURETIC PEPTIDE  TROPONIN I (HIGH SENSITIVITY)  TROPONIN I (HIGH SENSITIVITY)   ____________________________________________  EKG   EKG Interpretation  Date/Time:  Monday December 04 2020 14:06:42 EST Ventricular Rate:  76 PR Interval:    QRS Duration: 74 QT Interval:  374 QTC Calculation: 420 R Axis:   110 Text Interpretation: Sinus rhythm Septal infarct , age undetermined Lateral infarct , age undetermined Abnormal ECG No STEMI Confirmed by Nanda Quinton [phone]) on 12/04/2020 5:04:48 PM       ____________________________________________  RADIOLOGY  DG Chest 2 View  Result Date: 12/04/2020 CLINICAL DATA:  Shortness of breath EXAM: CHEST - 2 VIEW COMPARISON:  06/10/2020 FINDINGS: Chronic cardiomegaly and aortic atherosclerosis. Newly seen bilateral pleural effusions, larger on the right than the left. Chronic pleural calcification and scarring on the left. Upper lungs remain clear. Findings most likely related to  congestive heart failure. Mild chronic scoliotic curvature of the spine. IMPRESSION: 1. Congestive heart failure with bilateral pleural effusions, larger on the right than the left. 2. Chronic pleural calcification and scarring on the left. Electronically Signed   By: Nelson Chimes M.D.   On: 12/04/2020 14:39    ____________________________________________   PROCEDURES  Procedure(s) performed:   Procedures  None  ____________________________________________   INITIAL IMPRESSION / ASSESSMENT AND PLAN / ED COURSE  Pertinent labs & imaging results that were available during my care of the patient were reviewed by me and considered in my medical decision making (see chart for details).   Patient presents to the emergency department for evaluation of shortness of breath which is progressed over the past week.  She has pitting edema and x-ray showing CHF changes with bilateral pleural effusions.  The patient is not hypoxemic here.  Will ambulate with pulse ox.  Lower suspicion for Covid clinically.  Very low suspicion for ACS/PE.  EKG interpreted as above with no acute findings.  Plan for ambulation on pulse ox, BNP, COVID/Flu swab.  Patient and daughter verbalize a strong preference to return home for treatment  of possible.  I reviewed the patient's last echo which is from 2013 which was a limited study.  Patient is very well-appearing overall.  If labs are reassuring and she ambulates without becoming hypoxemic or develops increased work of breathing could consider a burst of Lasix at home with close PCP follow-up and outpatient ECHO.   Labs resulting showing normal BNP.  Kidney function and potassium are normal.  Covid PCR is negative.  Troponins negative x2.  Patient is not hypoxemic or experiencing increased work of breathing here in the emergency department.  Given the patient's age, discussed admission for diuresis and echo versus outpatient work-up and empiric treatment.  Daughter at bedside  for discussion.  Patient does not meet any heart criteria for admission at this time.  The daughter is with the patient 24 hours/day and can help her get back and forth to the restroom with PO lasix and can help arrange outpatient f/u +/- ECHO via PCP. Given the ongoing COVID pandemic the patient and daughter would prefer to at least try treatment at home and return if symptoms worsen. Discussed strict ED return precautions.  ____________________________________________  FINAL CLINICAL IMPRESSION(S) / ED DIAGNOSES  Final diagnoses:  SOB (shortness of breath)  Pleural effusion     MEDICATIONS GIVEN DURING THIS VISIT:  Medications  furosemide (LASIX) tablet 20 mg (has no administration in time range)     NEW OUTPATIENT MEDICATIONS STARTED DURING THIS VISIT:  New Prescriptions   FUROSEMIDE (LASIX) 20 MG TABLET    Take 1 tablet (20 mg total) by mouth daily for 4 days.    Note:  This document was prepared using Dragon voice recognition software and may include unintentional dictation errors.  Nanda Quinton, MD, [facility]    Alizee Maple, [facility], MD 12/04/20 2051

## 2020-12-04 NOTE — ED Notes (Signed)
MD at bedside at this time.

## 2020-12-04 NOTE — ED Notes (Signed)

## 2020-12-11 DIAGNOSIS — E441 Mild protein-calorie malnutrition: Secondary | ICD-10-CM | POA: Diagnosis not present

## 2020-12-11 DIAGNOSIS — J90 Pleural effusion, not elsewhere classified: Secondary | ICD-10-CM | POA: Diagnosis not present

## 2020-12-11 DIAGNOSIS — I509 Heart failure, unspecified: Secondary | ICD-10-CM | POA: Diagnosis not present

## 2020-12-11 DIAGNOSIS — Z6825 Body mass index (BMI) 25.0-25.9, adult: Secondary | ICD-10-CM | POA: Diagnosis not present

## 2020-12-27 DIAGNOSIS — I509 Heart failure, unspecified: Secondary | ICD-10-CM | POA: Diagnosis not present

## 2020-12-27 DIAGNOSIS — R0902 Hypoxemia: Secondary | ICD-10-CM | POA: Diagnosis not present

## 2021-01-10 ENCOUNTER — Encounter: Payer: Self-pay | Admitting: Physician Assistant

## 2021-01-10 DIAGNOSIS — R609 Edema, unspecified: Secondary | ICD-10-CM | POA: Diagnosis not present

## 2021-01-24 DIAGNOSIS — R0902 Hypoxemia: Secondary | ICD-10-CM | POA: Diagnosis not present

## 2021-01-24 DIAGNOSIS — I509 Heart failure, unspecified: Secondary | ICD-10-CM | POA: Diagnosis not present

## 2021-02-07 DIAGNOSIS — I4891 Unspecified atrial fibrillation: Secondary | ICD-10-CM | POA: Diagnosis not present

## 2021-02-07 DIAGNOSIS — I10 Essential (primary) hypertension: Secondary | ICD-10-CM | POA: Diagnosis not present

## 2021-02-07 DIAGNOSIS — E7849 Other hyperlipidemia: Secondary | ICD-10-CM | POA: Diagnosis not present

## 2021-02-16 IMAGING — CT CT ABD-PELV W/ CM
2 of 5 series · 16 of 46 positions shown, 18 images · IV contrast (Omnipaque or Isovue)
Comparison: None.

CLINICAL DATA: Acute abdominal pain

EXAM:
CT ABDOMEN AND PELVIS WITH CONTRAST
TECHNIQUE: Multidetector CT imaging of the abdomen and pelvis was performed
using the standard protocol following bolus administration of
intravenous contrast.
CONTRAST:  100mL OMNIPAQUE IOHEXOL 300 MG/ML  SOLN

[Series 2: axial st · axial · 0.66mm/px · z∈[-146,+199]mm · 13 of 79 slices shown, 15 images]
[im 5/79  soft-tissue]
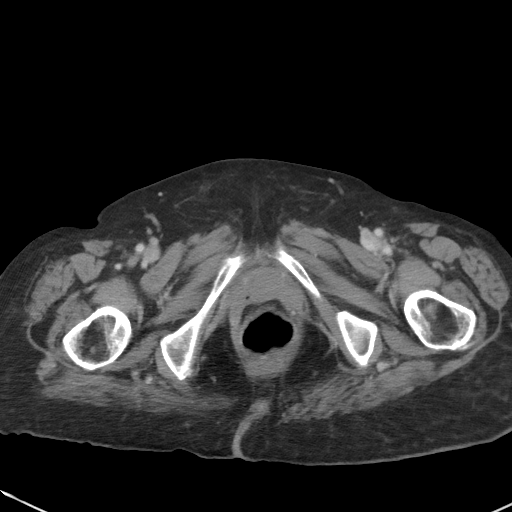
[im 5/79  bone]
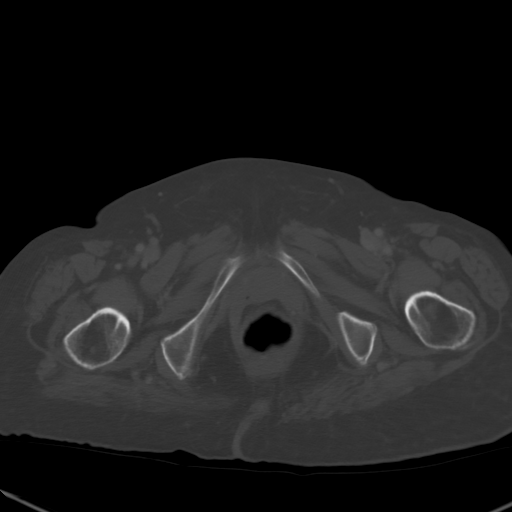
[im 9/79  soft-tissue]
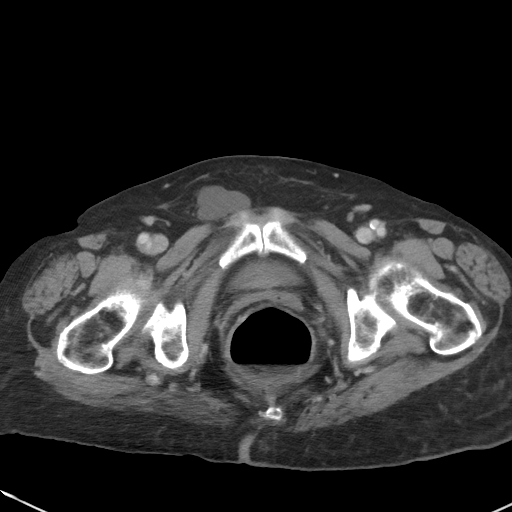
[im 18/79  soft-tissue]
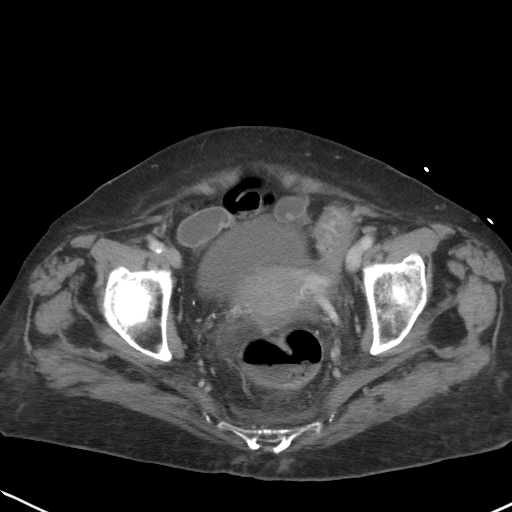
[im 22/79  soft-tissue]
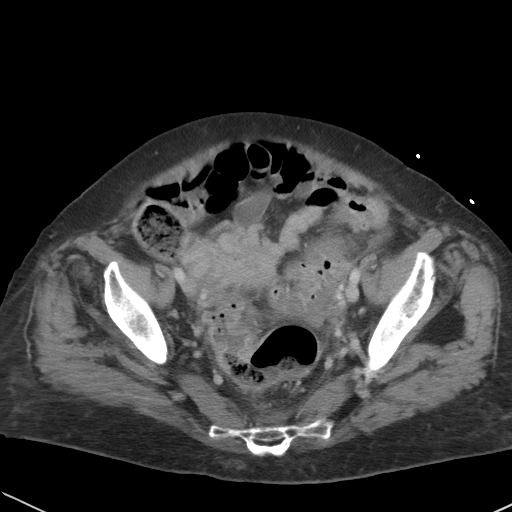
[im 27/79  soft-tissue]
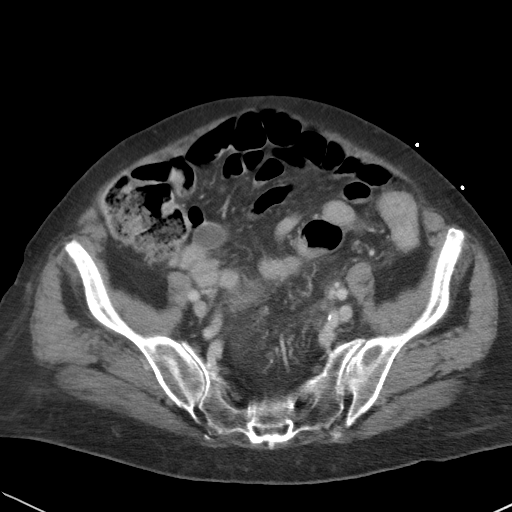
[im 35/79  soft-tissue]
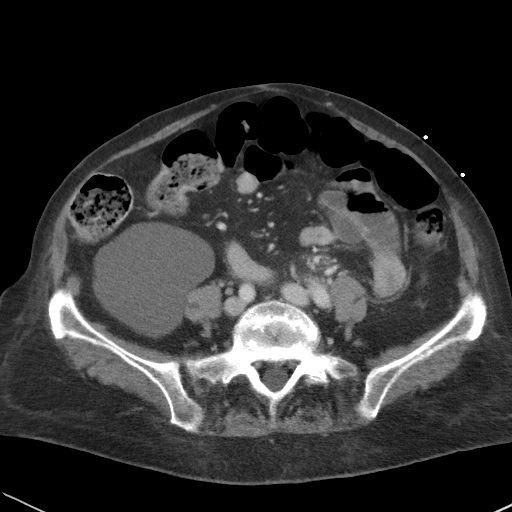
[im 40/79  soft-tissue]
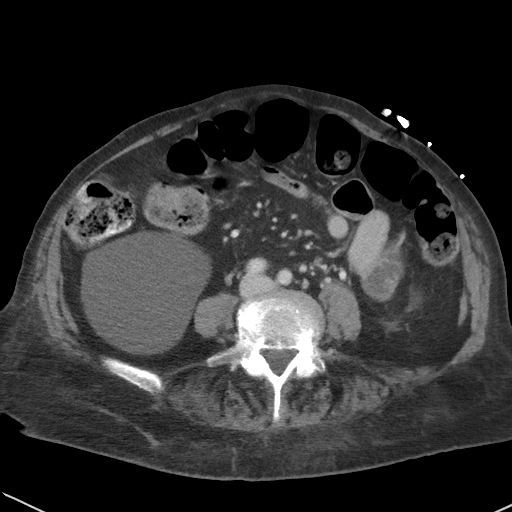
[im 44/79  soft-tissue]
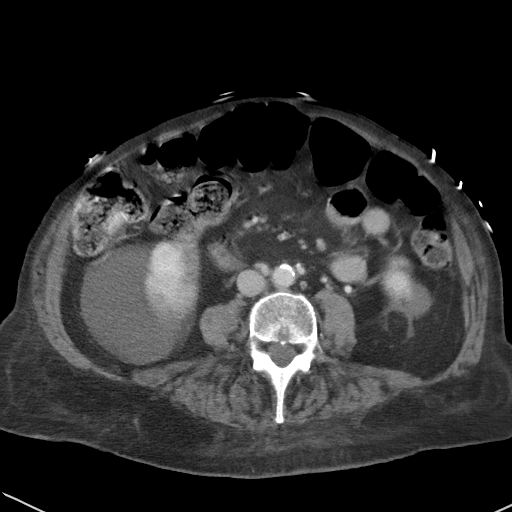
[im 53/79  soft-tissue]
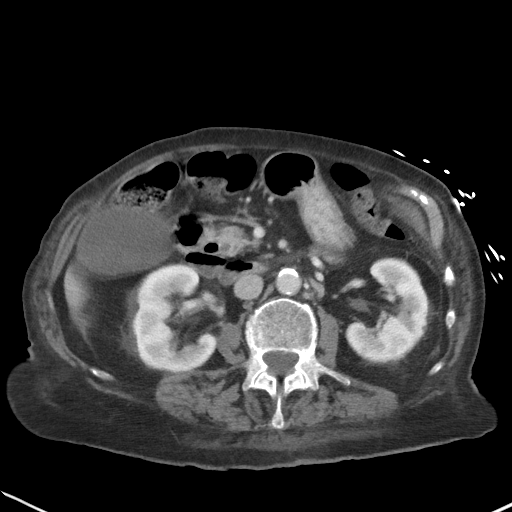
[im 53/79  bone]
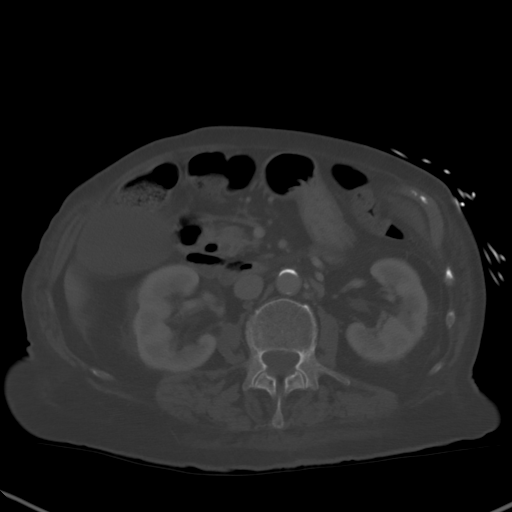
[im 57/79  soft-tissue]
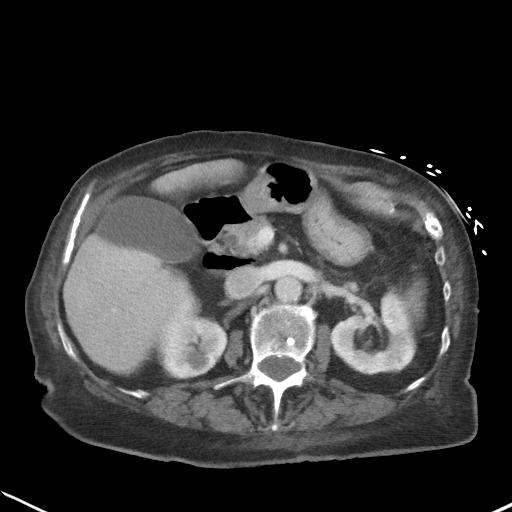
[im 61/79  soft-tissue]
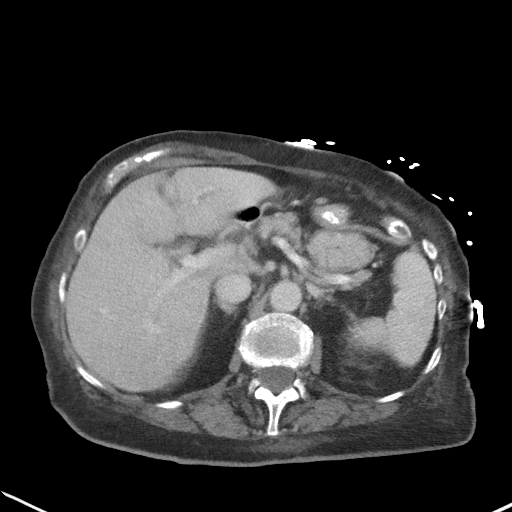
[im 70/79  soft-tissue]
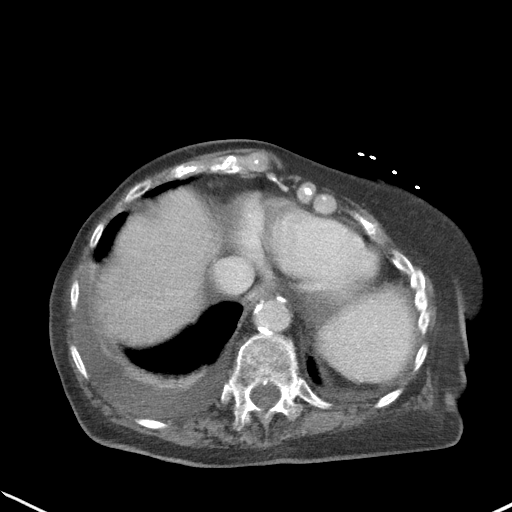
[im 74/79  soft-tissue]
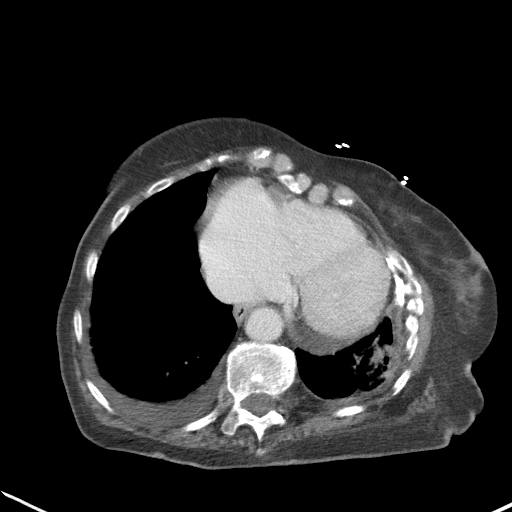

[Series 5: coronal st · coronal · 0.68mm/px · 3 of 80 slices shown]
[im 27/80  soft-tissue]
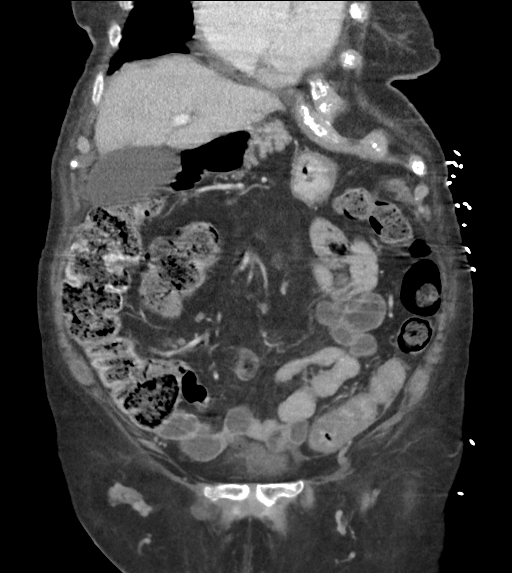
[im 36/80  soft-tissue]
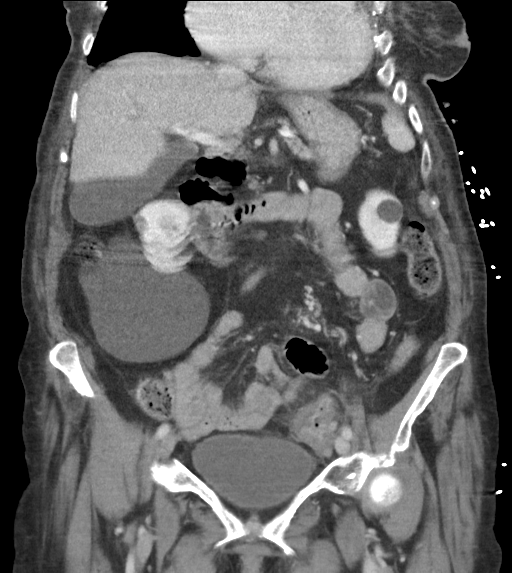
[im 44/80  soft-tissue]
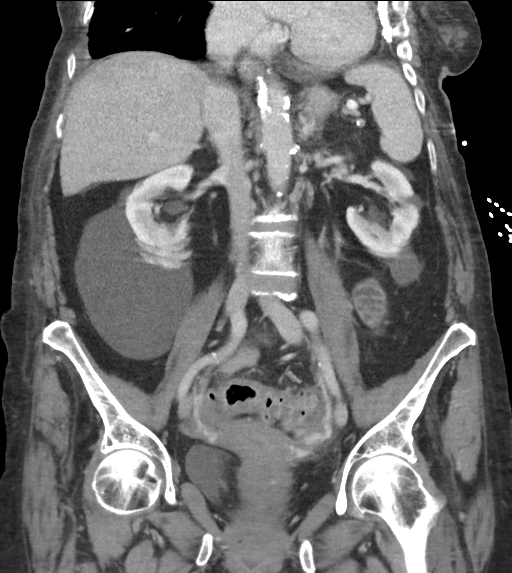

[16 of 46 positions shown; findings below may reference images not displayed]

FINDINGS: Lower chest: There is moderate cardiomegaly. Small bilateral pleural
effusions are seen, right greater than left. There is rounded
airspace opacity at the left lung base.

Hepatobiliary: The liver is normal in density without focal
abnormality.The main portal vein is patent. No evidence of calcified
gallstones, gallbladder wall thickening or biliary dilatation.

Pancreas: Unremarkable. No pancreatic ductal dilatation or
surrounding inflammatory changes.

Spleen: Normal in size without focal abnormality.

Adrenals/Urinary Tract: Both adrenal glands appear normal. Multiple
low-density lesions are seen throughout both kidneys the largest
measuring 2 cm in the lower pole of the left kidney. Adjacent to the
inferior pole of the right kidney there is a low-density cystic mass
measuring 8.7 x 7.8 cm with which appears to either be extending
from the lower pole or just adjacent.

Stomach/Bowel: The stomach and small bowel are normal in appearance.
There is a 10 cm segment of sigmoid colon with diffuse wall
thickening surrounding fat stranding changes. No definite free air
or loculated fluid collections are noted. There is a moderate amount
of colonic stool present. Presacral edema is noted.

Vascular/Lymphatic: There are no enlarged mesenteric,
retroperitoneal, or pelvic lymph nodes. Scattered aortic
atherosclerotic calcifications are seen without aneurysmal
dilatation.

Reproductive: The uterus and adnexa are unremarkable.

Other: Within the right inguinal region there is a 3.6 cm
low-density cystic mass.

Musculoskeletal: No acute or significant osseous findings.
IMPRESSION: Findings consistent with acute sigmoid colonic diverticulitis. No
definite loculated fluid collections or free air.

Large cystic mass seen adjacent to the inferior pole the right
kidney measuring 8.7 x 7.8 cm which appears to possibly be extending
from the lower pole of the right kidney or just adjacent to. Likely
represents a simple renal cyst or possible mesenteric cyst.

Aortic Atherosclerosis (P7O9T-3DA.A).

## 2021-02-24 DIAGNOSIS — R0902 Hypoxemia: Secondary | ICD-10-CM | POA: Diagnosis not present

## 2021-02-24 DIAGNOSIS — I509 Heart failure, unspecified: Secondary | ICD-10-CM | POA: Diagnosis not present

## 2021-03-07 ENCOUNTER — Encounter: Payer: Self-pay | Admitting: Cardiology

## 2021-03-07 ENCOUNTER — Other Ambulatory Visit: Payer: Self-pay

## 2021-03-07 ENCOUNTER — Ambulatory Visit: Payer: PPO | Admitting: Cardiology

## 2021-03-07 VITALS — BP 118/72 | HR 48 | Ht 62.0 in | Wt 140.0 lb

## 2021-03-07 DIAGNOSIS — I890 Lymphedema, not elsewhere classified: Secondary | ICD-10-CM | POA: Diagnosis not present

## 2021-03-07 DIAGNOSIS — R0602 Shortness of breath: Secondary | ICD-10-CM | POA: Diagnosis not present

## 2021-03-07 DIAGNOSIS — I4821 Permanent atrial fibrillation: Secondary | ICD-10-CM

## 2021-03-07 DIAGNOSIS — Z79899 Other long term (current) drug therapy: Secondary | ICD-10-CM | POA: Diagnosis not present

## 2021-03-07 MED ORDER — FUROSEMIDE 40 MG PO TABS: 40.0000 mg | ORAL_TABLET | Freq: Every day | ORAL | 3 refills | Status: DC

## 2021-03-07 MED ORDER — POTASSIUM CHLORIDE ER 10 MEQ PO TBCR: 10.0000 meq | EXTENDED_RELEASE_TABLET | Freq: Every day | ORAL | 3 refills | Status: DC

## 2021-03-07 NOTE — Progress Notes (Signed)
Cardiology Office Note  Date: 03/07/2021   ID: Miyana, Mordecai [birth year], MRN [id number]  PCP:  Redmond School, MD  Cardiologist:  Rozann Lesches, MD Electrophysiologist:  None   Chief Complaint  Patient presents with  . Cardiac evaluation    History of Present Illness: Kathryn Marquez is a 87 y.o. female referred for cardiology consultation by Mr. Collene Mares PA-C to assist with medication adjustments.  Records indicate ER visit in January with leg swelling and shortness of breath.  Chest x-ray at that time was consistent with congestive heart failure and associated bilateral pleural effusions.  High-sensitivity troponin I levels were not suggestive of ACS, BNP was normal at 22.  She was found to have ambulatory hypoxemia on evaluation by Mr. Eston Mould in January, started on home oxygen at that time.  She is using this predominantly at nighttime.  We went over her medications which are listed below.  She continues on Lasix 20 mg daily.  Still reports leg swelling although breathing status has improved.  She is in a wheelchair today.  Recent nosebleed on Xarelto.  Aspirin has been discontinued.  Past Medical History:  Diagnosis Date  . Atrial fibrillation ([place])   . Essential hypertension   . History of stroke 04/29/2012  . Recurrent Clostridioides difficile diarrhea   . Thrombocytopenia ([place])   . TIA (transient ischemic attack)     Past Surgical History:  Procedure Laterality Date  . No prior surgery      Current Outpatient Medications  Medication Sig Dispense Refill  . Acetaminophen (TYLENOL DISSOLVE PACKS) 500 MG PACK Take by mouth.    Marland Kitchen ascorbic acid (VITAMIN C) 500 MG tablet Take 500 mg by mouth daily.    . calcium-vitamin D (OSCAL WITH D) 500-200 MG-UNIT tablet Take 1 tablet by mouth daily with breakfast.    . cetirizine (ZYRTEC) 10 MG tablet Take 10 mg by mouth in the morning.     . Cyanocobalamin (B-12 PO) Take 1 tablet by mouth daily.    . furosemide (LASIX) 40  MG tablet Take 1 tablet (40 mg total) by mouth daily. 90 tablet 3  . metoprolol succinate (TOPROL-XL) 25 MG 24 hr tablet Take 1 tablet (25 mg total) by mouth daily. New blood pressure medicine. 30 tablet 2  . Multiple Vitamins-Minerals (VITAMIN D3 COMPLETE PO) Take 1 tablet by mouth daily.    . potassium chloride (KLOR-CON) 10 MEQ tablet Take 1 tablet (10 mEq total) by mouth daily. 90 tablet 3  . saccharomyces boulardii (FLORASTOR) 250 MG capsule Take 250 mg by mouth 2 (two) times daily.    Alveda Reasons 15 MG TABS tablet Take 15 mg by mouth in the morning.      No current facility-administered medications for this visit.   Allergies:  Patient has no known allergies.   Social History: The patient  reports that she has never smoked. She has never used smokeless tobacco. She reports that she does not drink alcohol and does not use drugs.   Family History: The patient's family history includes Cancer in her brother and another family member; Diabetes in her son; Fibromyalgia in her mother and son; Heart attack in her brother and mother; Hypercholesterolemia in her mother; Hypertension in her son; Leukemia in her sister; Stroke in her father and another family member.   ROS: Hearing loss.  Physical Exam: VS:  BP 118/72   Pulse (!) 48   Ht 5\' 2"  (1.575 m)   Wt 140 lb (  63.5 kg)   SpO2 94%   BMI 25.61 kg/m , BMI Body mass index is 25.61 kg/m.  Wt Readings from Last 3 Encounters:  03/07/21 140 lb (63.5 kg)  12/04/20 140 lb (63.5 kg)  08/10/20 133 lb 4.8 oz (60.5 kg)    General: Pleasant elderly woman, seated in wheelchair, appears comfortable. HEENT: Conjunctiva and lids normal, wearing a mask. Neck: Supple, no elevated JVP or carotid bruits, no thyromegaly. Lungs: Decreased breath sounds at the bases, nonlabored breathing at rest. Cardiac: Irregularly irregular, no S3, soft systolic murmur, no pericardial rub. Abdomen: Soft, nontender, bowel sounds present. Extremities: 2-3+ lower  extremity edema with associated lymphedema. Skin: Warm and dry. Musculoskeletal: Kyphosis noted. Neuropsychiatric: Alert and oriented x3, affect grossly appropriate.  ECG:  An ECG dated 12/04/2020 was personally reviewed today and demonstrated:  Rate controlled atrial fibrillation with lead motion artifact.  Recent Labwork: 06/15/2020: ALT 25; AST 30; Magnesium 1.6 12/04/2020: B Natriuretic Peptide 22.0; BUN 12; Creatinine, Ser 0.65; Hemoglobin 13.2; Platelets 123; Potassium 4.0; Sodium 134     Component Value Date/Time   CHOL 199 04/29/2012 0518   TRIG 109 04/29/2012 0518   HDL 74 04/29/2012 0518   CHOLHDL 2.7 04/29/2012 0518   VLDL 22 04/29/2012 0518   LDLCALC 103 (H) 04/29/2012 0518    Other Studies Reviewed Today:  Echocardiogram 04/29/2012: - Procedure narrative: Transthoracic echocardiography.  Suboptimal image quality with poor acoustic windows.  - Left ventricle: The cavity size was normal. There was  moderate concentric hypertrophy. Systolic function was  normal. The estimated ejection fraction was in the range  of 60% to 65%, with beat to beat variation. The study is  not technically sufficient to allow evaluation of LV  diastolic function.  - Mitral valve: Calcified annulus. Mild regurgitation.  - Left atrium: The atrium was mildly dilated.  - Right ventricle: The cavity size was moderately dilated.  - Right atrium: The atrium was moderately dilated.  - Atrial septum: No defect or patent foramen ovale was  identified.  - Pulmonary arteries: PA peak pressure: 38mm Hg (S) + right  atrial pressure.  - Impressions: Frequent prolonged ventricular pauses (>3  seconds) during the study.   Chest x-ray 12/04/2020: FINDINGS: Chronic cardiomegaly and aortic atherosclerosis. Newly seen bilateral pleural effusions, larger on the right than the left. Chronic pleural calcification and scarring on the left. Upper lungs remain clear. Findings most likely related  to congestive heart failure. Mild chronic scoliotic curvature of the spine.  IMPRESSION: 1. Congestive heart failure with bilateral pleural effusions, larger on the right than the left. 2. Chronic pleural calcification and scarring on the left.  Assessment and Plan:  1.  Bilateral leg edema and lymphedema.  This has not improved on Lasix 20 mg daily.  Plan is to increase Lasix to 40 mg daily and add KCl 10 mEq daily.  Check BMET in 7 to 10 days.  Also check echocardiogram to exclude cardiomyopathy (normal BNP but pleural effusions at ER evaluation in January) as this would at least help guide medical therapy adjustments.  I did talk with her some about mechanical compression for lymphedema if diuretics do not help.  2.  Permanent atrial fibrillation with CHA2DS2-VASc score of 7.  She is on Xarelto for stroke prophylaxis, heart rate controlled on Toprol-XL without report of palpitations.  3.  Essential hypertension by history, blood pressure is normal today on metoprolol.  Medication Adjustments/Labs and Tests Ordered: Current medicines are reviewed at length with the patient today.  Concerns regarding medicines are outlined above.   Tests Ordered: Orders Placed This Encounter  Procedures  . Basic metabolic panel  . ECHOCARDIOGRAM COMPLETE    Medication Changes: Meds ordered this encounter  Medications  . furosemide (LASIX) 40 MG tablet    Sig: Take 1 tablet (40 mg total) by mouth daily.    Dispense:  90 tablet    Refill:  3  . potassium chloride (KLOR-CON) 10 MEQ tablet    Sig: Take 1 tablet (10 mEq total) by mouth daily.    Dispense:  90 tablet    Refill:  3    Disposition:  Follow up test results.  Signed, Satira Sark, MD, [facility] 03/07/2021 3:04 PM    [facility] [address] [place], [place] [address] Phone: [phone]; Fax: [phone]

## 2021-03-07 NOTE — Patient Instructions (Signed)
Medication Instructions:  Your physician has recommended you make the following change in your medication:  INCREASE Lasix to 40 mg daily START Potassium Chloride 10 mEq daily  *If you need a refill on your cardiac medications before your next appointment, please call your pharmacy*   Lab Work: BMET- In 7-10 days If you have labs (blood work) drawn today and your tests are completely normal, you will receive your results only by: Marland Kitchen MyChart Message (if you have MyChart) OR . A paper copy in the mail If you have any lab test that is abnormal or we need to change your treatment, we will call you to review the results.   Testing/Procedures: Your physician has requested that you have an echocardiogram. Echocardiography is a painless test that uses sound waves to create images of your heart. It provides your doctor with information about the size and shape of your heart and how well your heart's chambers and valves are working. This procedure takes approximately one hour. There are no restrictions for this procedure.     Follow-Up: At [facility], you and your health needs are our priority.  As part of our continuing mission to provide you with exceptional heart care, we have created designated Provider Care Teams.  These Care Teams include your primary Cardiologist (physician) and Advanced Practice Providers (APPs -  Physician Assistants and Nurse Practitioners) who all work together to provide you with the care you need, when you need it.  We recommend signing up for the patient portal called "MyChart".  Sign up information is provided on this After Visit Summary.  MyChart is used to connect with patients for Virtual Visits (Telemedicine).  Patients are able to view lab/test results, encounter notes, upcoming appointments, etc.  Non-urgent messages can be sent to your provider as well.   To learn more about what you can do with MyChart, go to [email or web address].    Your next  appointment:   Follow Up - To be determined   Other Instructions

## 2021-03-19 ENCOUNTER — Other Ambulatory Visit: Payer: Self-pay

## 2021-03-19 ENCOUNTER — Other Ambulatory Visit (HOSPITAL_COMMUNITY)
Admission: RE | Admit: 2021-03-19 | Discharge: 2021-03-19 | Disposition: A | Discharge status: Home / Self Care | Payer: PPO | Source: Ambulatory Visit | Attending: Cardiology | Admitting: Cardiology

## 2021-03-19 DIAGNOSIS — Z79899 Other long term (current) drug therapy: Secondary | ICD-10-CM | POA: Diagnosis not present

## 2021-03-19 LAB — BASIC METABOLIC PANEL
Anion gap: 10 (ref 5–15)
BUN: 21 mg/dL (ref 8–23)
CO2: 32 mmol/L (ref 22–32)
Calcium: 9.4 mg/dL (ref 8.9–10.3)
Chloride: 96 mmol/L — ABNORMAL LOW (ref 98–111)
Creatinine, Ser: 0.68 mg/dL (ref 0.44–1.00)
GFR, Estimated: >60 mL/min (ref >60)
Glucose, Bld: 87 mg/dL (ref 70–99)
Potassium: 3.9 mmol/L (ref 3.5–5.1)
Sodium: 138 mmol/L (ref 135–145)

## 2021-03-20 DIAGNOSIS — G9341 Metabolic encephalopathy: Secondary | ICD-10-CM | POA: Diagnosis not present

## 2021-03-20 DIAGNOSIS — R531 Weakness: Secondary | ICD-10-CM | POA: Diagnosis not present

## 2021-03-20 DIAGNOSIS — R109 Unspecified abdominal pain: Secondary | ICD-10-CM | POA: Diagnosis not present

## 2021-03-26 DIAGNOSIS — I509 Heart failure, unspecified: Secondary | ICD-10-CM | POA: Diagnosis not present

## 2021-03-26 DIAGNOSIS — R0902 Hypoxemia: Secondary | ICD-10-CM | POA: Diagnosis not present

## 2021-04-16 ENCOUNTER — Other Ambulatory Visit: Payer: Self-pay

## 2021-04-16 ENCOUNTER — Ambulatory Visit (HOSPITAL_COMMUNITY)
Admission: RE | Admit: 2021-04-16 | Discharge: 2021-04-16 | Disposition: A | Discharge status: Home / Self Care | Payer: PPO | Source: Ambulatory Visit | Attending: Cardiology | Admitting: Cardiology

## 2021-04-16 DIAGNOSIS — R0602 Shortness of breath: Secondary | ICD-10-CM | POA: Insufficient documentation

## 2021-04-16 LAB — ECHOCARDIOGRAM COMPLETE
Area-P 1/2: 5.5 cm2
S' Lateral: 2.7 cm

## 2021-04-16 NOTE — Progress Notes (Signed)
*  PRELIMINARY RESULTS* Echocardiogram 2D Echocardiogram has been performed.  Kathryn Marquez 04/16/2021, 11:16 AM

## 2021-04-20 DIAGNOSIS — R109 Unspecified abdominal pain: Secondary | ICD-10-CM | POA: Diagnosis not present

## 2021-04-20 DIAGNOSIS — G9341 Metabolic encephalopathy: Secondary | ICD-10-CM | POA: Diagnosis not present

## 2021-04-20 DIAGNOSIS — R531 Weakness: Secondary | ICD-10-CM | POA: Diagnosis not present

## 2021-04-26 DIAGNOSIS — I509 Heart failure, unspecified: Secondary | ICD-10-CM | POA: Diagnosis not present

## 2021-04-26 DIAGNOSIS — R0902 Hypoxemia: Secondary | ICD-10-CM | POA: Diagnosis not present

## 2021-05-20 DIAGNOSIS — G9341 Metabolic encephalopathy: Secondary | ICD-10-CM | POA: Diagnosis not present

## 2021-05-20 DIAGNOSIS — R109 Unspecified abdominal pain: Secondary | ICD-10-CM | POA: Diagnosis not present

## 2021-05-20 DIAGNOSIS — R531 Weakness: Secondary | ICD-10-CM | POA: Diagnosis not present

## 2021-05-26 ENCOUNTER — Encounter (HOSPITAL_COMMUNITY): Payer: Self-pay | Admitting: Emergency Medicine

## 2021-05-26 ENCOUNTER — Emergency Department (HOSPITAL_COMMUNITY): Payer: PPO

## 2021-05-26 ENCOUNTER — Other Ambulatory Visit: Payer: Self-pay

## 2021-05-26 ENCOUNTER — Inpatient Hospital Stay (HOSPITAL_COMMUNITY)
Admission: EM | Admit: 2021-05-26 | Discharge: 2021-05-30 | DRG: 178 | Disposition: A | Discharge status: Home Health | Payer: PPO | Attending: Family Medicine | Admitting: Family Medicine

## 2021-05-26 DIAGNOSIS — Z7901 Long term (current) use of anticoagulants: Secondary | ICD-10-CM | POA: Diagnosis not present

## 2021-05-26 DIAGNOSIS — U071 COVID-19: Secondary | ICD-10-CM | POA: Diagnosis present

## 2021-05-26 DIAGNOSIS — R9431 Abnormal electrocardiogram [ECG] [EKG]: Secondary | ICD-10-CM

## 2021-05-26 DIAGNOSIS — E876 Hypokalemia: Secondary | ICD-10-CM | POA: Diagnosis not present

## 2021-05-26 DIAGNOSIS — L89321 Pressure ulcer of left buttock, stage 1: Secondary | ICD-10-CM | POA: Diagnosis present

## 2021-05-26 DIAGNOSIS — Z66 Do not resuscitate: Secondary | ICD-10-CM | POA: Diagnosis present

## 2021-05-26 DIAGNOSIS — Z79899 Other long term (current) drug therapy: Secondary | ICD-10-CM | POA: Diagnosis not present

## 2021-05-26 DIAGNOSIS — Z806 Family history of leukemia: Secondary | ICD-10-CM | POA: Diagnosis not present

## 2021-05-26 DIAGNOSIS — R7989 Other specified abnormal findings of blood chemistry: Secondary | ICD-10-CM | POA: Diagnosis not present

## 2021-05-26 DIAGNOSIS — L89311 Pressure ulcer of right buttock, stage 1: Secondary | ICD-10-CM | POA: Diagnosis present

## 2021-05-26 DIAGNOSIS — R0602 Shortness of breath: Secondary | ICD-10-CM | POA: Diagnosis present

## 2021-05-26 DIAGNOSIS — Z833 Family history of diabetes mellitus: Secondary | ICD-10-CM

## 2021-05-26 DIAGNOSIS — Z8673 Personal history of transient ischemic attack (TIA), and cerebral infarction without residual deficits: Secondary | ICD-10-CM

## 2021-05-26 DIAGNOSIS — E785 Hyperlipidemia, unspecified: Secondary | ICD-10-CM | POA: Diagnosis present

## 2021-05-26 DIAGNOSIS — R059 Cough, unspecified: Secondary | ICD-10-CM | POA: Diagnosis not present

## 2021-05-26 DIAGNOSIS — Z8249 Family history of ischemic heart disease and other diseases of the circulatory system: Secondary | ICD-10-CM | POA: Diagnosis not present

## 2021-05-26 DIAGNOSIS — Z83438 Family history of other disorder of lipoprotein metabolism and other lipidemia: Secondary | ICD-10-CM | POA: Diagnosis not present

## 2021-05-26 DIAGNOSIS — J9611 Chronic respiratory failure with hypoxia: Secondary | ICD-10-CM

## 2021-05-26 DIAGNOSIS — Z823 Family history of stroke: Secondary | ICD-10-CM

## 2021-05-26 DIAGNOSIS — H9190 Unspecified hearing loss, unspecified ear: Secondary | ICD-10-CM | POA: Diagnosis present

## 2021-05-26 DIAGNOSIS — R0902 Hypoxemia: Secondary | ICD-10-CM | POA: Diagnosis not present

## 2021-05-26 DIAGNOSIS — D696 Thrombocytopenia, unspecified: Secondary | ICD-10-CM

## 2021-05-26 DIAGNOSIS — R1084 Generalized abdominal pain: Secondary | ICD-10-CM

## 2021-05-26 DIAGNOSIS — J90 Pleural effusion, not elsewhere classified: Secondary | ICD-10-CM | POA: Diagnosis not present

## 2021-05-26 DIAGNOSIS — I509 Heart failure, unspecified: Secondary | ICD-10-CM | POA: Diagnosis not present

## 2021-05-26 DIAGNOSIS — I10 Essential (primary) hypertension: Secondary | ICD-10-CM | POA: Diagnosis present

## 2021-05-26 DIAGNOSIS — D6959 Other secondary thrombocytopenia: Secondary | ICD-10-CM | POA: Diagnosis present

## 2021-05-26 DIAGNOSIS — I482 Chronic atrial fibrillation, unspecified: Secondary | ICD-10-CM | POA: Diagnosis present

## 2021-05-26 DIAGNOSIS — I517 Cardiomegaly: Secondary | ICD-10-CM | POA: Diagnosis not present

## 2021-05-26 DIAGNOSIS — L8990 Pressure ulcer of unspecified site, unspecified stage: Secondary | ICD-10-CM | POA: Insufficient documentation

## 2021-05-26 DIAGNOSIS — I5033 Acute on chronic diastolic (congestive) heart failure: Secondary | ICD-10-CM | POA: Diagnosis not present

## 2021-05-26 LAB — COMPREHENSIVE METABOLIC PANEL
ALT: 16 U/L (ref 0–44)
AST: 27 U/L (ref 15–41)
Albumin: 3.8 g/dL (ref 3.5–5.0)
Alkaline Phosphatase: 54 U/L (ref 38–126)
Anion gap: 9 (ref 5–15)
BUN: 14 mg/dL (ref 8–23)
CO2: 35 mmol/L — ABNORMAL HIGH (ref 22–32)
Calcium: 8.6 mg/dL — ABNORMAL LOW (ref 8.9–10.3)
Chloride: 94 mmol/L — ABNORMAL LOW (ref 98–111)
Creatinine, Ser: 0.69 mg/dL (ref 0.44–1.00)
GFR, Estimated: >60 mL/min (ref >60)
Glucose, Bld: 140 mg/dL — ABNORMAL HIGH (ref 70–99)
Potassium: 3.4 mmol/L — ABNORMAL LOW (ref 3.5–5.1)
Sodium: 138 mmol/L (ref 135–145)
Total Bilirubin: 1.5 mg/dL — ABNORMAL HIGH (ref 0.3–1.2)
Total Protein: 6.7 g/dL (ref 6.5–8.1)

## 2021-05-26 LAB — LACTIC ACID, PLASMA
Lactic Acid, Venous: 1.3 mmol/L (ref 0.5–1.9)
Lactic Acid, Venous: 1.2 mmol/L (ref 0.5–1.9)

## 2021-05-26 LAB — RESP PANEL BY RT-PCR (FLU A&B, COVID) ARPGX2
Influenza A by PCR: NEGATIVE
Influenza B by PCR: NEGATIVE
SARS Coronavirus 2 by RT PCR: POSITIVE — AB

## 2021-05-26 LAB — D-DIMER, QUANTITATIVE: D-Dimer, Quant: 0.7 ug/mL-FEU — ABNORMAL HIGH (ref 0.00–0.50)

## 2021-05-26 LAB — CBC
HCT: 42.3 % (ref 36.0–46.0)
Hemoglobin: 13.9 g/dL (ref 12.0–15.0)
MCH: 31.2 pg (ref 26.0–34.0)
MCHC: 32.9 g/dL (ref 30.0–36.0)
MCV: 95.1 fL (ref 80.0–100.0)
Platelets: 86 10*3/uL — ABNORMAL LOW (ref 150–400)
RBC: 4.45 MIL/uL (ref 3.87–5.11)
RDW: 13.6 % (ref 11.5–15.5)
WBC: 8 10*3/uL (ref 4.0–10.5)
nRBC: 0 % (ref 0.0–0.2)

## 2021-05-26 LAB — PROCALCITONIN: Procalcitonin: <0.1 ng/mL

## 2021-05-26 LAB — BASIC METABOLIC PANEL
Anion gap: 11 (ref 5–15)
BUN: 15 mg/dL (ref 8–23)
CO2: 34 mmol/L — ABNORMAL HIGH (ref 22–32)
Calcium: 9 mg/dL (ref 8.9–10.3)
Chloride: 93 mmol/L — ABNORMAL LOW (ref 98–111)
Creatinine, Ser: 0.6 mg/dL (ref 0.44–1.00)
GFR, Estimated: >60 mL/min (ref >60)
Glucose, Bld: 133 mg/dL — ABNORMAL HIGH (ref 70–99)
Potassium: 3.7 mmol/L (ref 3.5–5.1)
Sodium: 138 mmol/L (ref 135–145)

## 2021-05-26 LAB — BRAIN NATRIURETIC PEPTIDE: B Natriuretic Peptide: 247 pg/mL — ABNORMAL HIGH (ref 0.0–100.0)

## 2021-05-26 LAB — TROPONIN I (HIGH SENSITIVITY)
Troponin I (High Sensitivity): 8 ng/L (ref <18)
Troponin I (High Sensitivity): 9 ng/L (ref <18)

## 2021-05-26 LAB — TRIGLYCERIDES: Triglycerides: 37 mg/dL (ref <150)

## 2021-05-26 LAB — C-REACTIVE PROTEIN: CRP: 1.3 mg/dL — ABNORMAL HIGH (ref <1.0)

## 2021-05-26 LAB — LACTATE DEHYDROGENASE: LDH: 149 U/L (ref 98–192)

## 2021-05-26 LAB — MAGNESIUM: Magnesium: 1.7 mg/dL (ref 1.7–2.4)

## 2021-05-26 LAB — FIBRINOGEN: Fibrinogen: 358 mg/dL (ref 210–475)

## 2021-05-26 LAB — FERRITIN: Ferritin: 262 ng/mL (ref 11–307)

## 2021-05-26 MED ORDER — ALBUTEROL SULFATE HFA 108 (90 BASE) MCG/ACT IN AERS
2.0000 | INHALATION_SPRAY | Freq: Four times a day (QID) | RESPIRATORY_TRACT | Status: DC
  Administered 2021-05-26 – 2021-05-28 (×6): 2 via RESPIRATORY_TRACT

## 2021-05-26 MED ORDER — GUAIFENESIN-DM 100-10 MG/5ML PO SYRP
10.0000 mL | ORAL_SOLUTION | ORAL | Status: DC | PRN
  Administered 2021-05-26 – 2021-05-28 (×2): 10 mL via ORAL
  Filled 2021-05-26 (×2): qty 10

## 2021-05-26 MED ORDER — PANTOPRAZOLE SODIUM 40 MG IV SOLR
40.0000 mg | Freq: Every day | INTRAVENOUS | Status: DC
  Administered 2021-05-26 – 2021-05-29 (×4): 40 mg via INTRAVENOUS
  Filled 2021-05-26 (×4): qty 40

## 2021-05-26 MED ORDER — ZINC SULFATE 220 (50 ZN) MG PO CAPS
220.0000 mg | ORAL_CAPSULE | Freq: Every day | ORAL | Status: DC
  Administered 2021-05-26 – 2021-05-30 (×5): 220 mg via ORAL
  Filled 2021-05-26 (×5): qty 1

## 2021-05-26 MED ORDER — ACETAMINOPHEN 325 MG PO TABS
650.0000 mg | ORAL_TABLET | Freq: Four times a day (QID) | ORAL | Status: DC | PRN
  Administered 2021-05-27 – 2021-05-28 (×2): 650 mg via ORAL
  Filled 2021-05-26 (×2): qty 2

## 2021-05-26 MED ORDER — ALBUTEROL SULFATE HFA 108 (90 BASE) MCG/ACT IN AERS
2.0000 | INHALATION_SPRAY | Freq: Once | RESPIRATORY_TRACT | Status: AC
  Administered 2021-05-26: 2 via RESPIRATORY_TRACT
  Filled 2021-05-26: qty 6.7

## 2021-05-26 MED ORDER — FUROSEMIDE 10 MG/ML IJ SOLN
40.0000 mg | Freq: Every day | INTRAMUSCULAR | Status: DC
  Administered 2021-05-26: 40 mg via INTRAVENOUS
  Filled 2021-05-26: qty 4

## 2021-05-26 MED ORDER — SODIUM CHLORIDE 0.9 % IV SOLN
100.0000 mg | INTRAVENOUS | Status: AC
  Administered 2021-05-26 (×2): 100 mg via INTRAVENOUS
  Filled 2021-05-26: qty 20

## 2021-05-26 MED ORDER — SODIUM CHLORIDE 0.9 % IV SOLN
100.0000 mg | Freq: Every day | INTRAVENOUS | Status: AC
  Administered 2021-05-27 – 2021-05-30 (×4): 100 mg via INTRAVENOUS
  Filled 2021-05-26 (×4): qty 20

## 2021-05-26 MED ORDER — ASCORBIC ACID 500 MG PO TABS
500.0000 mg | ORAL_TABLET | Freq: Every day | ORAL | Status: DC
  Administered 2021-05-26 – 2021-05-30 (×5): 500 mg via ORAL
  Filled 2021-05-26 (×5): qty 1

## 2021-05-26 MED ORDER — HYDROCOD POLST-CPM POLST ER 10-8 MG/5ML PO SUER: 5.0000 mL | Freq: Two times a day (BID) | ORAL | Status: DC | PRN

## 2021-05-26 NOTE — ED Notes (Signed)
Rip Harbour, RN, [facility] made aware of need for remdesivir as we are out in Ed pyxis.

## 2021-05-26 NOTE — ED Triage Notes (Addendum)
Pt brought in by daughter for c/o shortness of breath and cough that started yesterday. Pt has home O2 on at 2L for about the past 6 months.   Pts son was positive for COVID about 10 days ago, pt has not had a COVID test since being exposed.

## 2021-05-26 NOTE — ED Notes (Signed)
Pt does not inhale inhaler properly. Pt only holds it in her mouth. Nurse attempted to teach and daughter attempted to teach.

## 2021-05-26 NOTE — ED Notes (Signed)
Pt is hard of hearing and support person answered most questions.

## 2021-05-26 NOTE — H&P (Signed)
History and Physical  Kathryn Marquez VWU:[id number] DOB: 02/18/1921 DOA: 05/26/2021  Referring physician: Sherwood Gambler, MD  PCP: Redmond School, MD  Patient coming from: Home  Chief Complaint: Shortness of Breath  HPI: Kathryn Marquez is a 87 y.o. female with medical history significant for chronic respiratory failure with hypoxia on supplemental oxygen brain CT at 2 PM, chronic atrial fibrillation on Xarelto, hypertension, thrombocytopenia and prior CVA who presents to the emergency department accompanied by daughter due to increasing and worsening shortness of breath which started yesterday, this was associated with nonproductive cough and chest congestion and chest pain which worsens with cough.  Patient was recently exposed to son who has had COVID within the last 10 days.  She denies fever, chills, nausea, vomiting.  Patient is hard of hearing and most of the history was obtained from daughter at bedside.  ED Course:  In the emergency department, she was tachypneic, BP was 138/77, other vital signs were within normal range, O2 sat was 96 to 100% on supplemental oxygen via [place] at 2 LPM.  Work-up in ED showed thrombocytopenia and mild hyperglycemia, BNP is 247, troponin x 2- 8 > 9.  Lactic acid 1.3.  Influenza A, B was negative.  SARS coronavirus 2 was positive. Chest x-ray showed chronic calcified pleural plaques and scarring on the left and cardiomegaly with probable trace right pleural effusion. Plan treatment with albuterol was provided, patient was started on IV remdesivir.  Hospitalist was asked to admit patient for further evaluation and management.  Review of Systems: Constitutional: Negative for chills and fever.  HENT: Negative for ear pain and sore throat.   Eyes: Negative for pain and visual disturbance.  Respiratory: Positive for cough and shortness of breath.   Cardiovascular: Positive for chest pain associated with cough.  Negative for palpitations.  Gastrointestinal:  Negative for abdominal pain and vomiting.  Endocrine: Negative for polyphagia and polyuria.  Genitourinary: Negative for decreased urine volume, dysuria, enuresis Musculoskeletal: Negative for arthralgias and back pain.  Skin: Negative for color change and rash.  Allergic/Immunologic: Negative for immunocompromised state.  Neurological: Negative for tremors, syncope, speech difficulty, light-headedness and headaches.  Hematological: Does not bruise/bleed easily.  All other systems reviewed and are negative  Past Medical History:  Diagnosis Date   Atrial fibrillation [facility])    Essential hypertension    History of stroke 04/29/2012   Recurrent Clostridioides difficile diarrhea    Thrombocytopenia (HCC)    TIA (transient ischemic attack)    Past Surgical History:  Procedure Laterality Date   No prior surgery      Social History:  reports that she has never smoked. She has never used smokeless tobacco. She reports that she does not drink alcohol and does not use drugs.   No Known Allergies  Family History  Problem Relation Age of Onset   Stroke Other    Cancer Other    Heart attack Mother    Fibromyalgia Mother    Hypercholesterolemia Mother    Leukemia Sister    Cancer Brother    Stroke Father    Heart attack Brother    Diabetes Son    Fibromyalgia Son    Hypertension Son      Prior to Admission medications   Medication Sig Start Date End Date Taking? Authorizing Provider  ascorbic acid (VITAMIN C) 500 MG tablet Take 500 mg by mouth daily.   Yes [provider]  calcium-vitamin D (OSCAL WITH D) 500-200 MG-UNIT tablet Take 1 tablet  by mouth daily with breakfast.   Yes [provider]  cetirizine (ZYRTEC) 10 MG tablet Take 10 mg by mouth in the morning.    Yes [provider]  Cyanocobalamin (B-12 PO) Take 1 tablet by mouth daily.   Yes [provider]  furosemide (LASIX) 40 MG tablet Take 1 tablet (40 mg total) by mouth daily. 03/07/21  06/05/21 Yes Satira Sark, MD  metoprolol succinate (TOPROL-XL) 25 MG 24 hr tablet Take 1 tablet (25 mg total) by mouth daily. New blood pressure medicine. 04/30/12 07/21/21 Yes Rexene Alberts, MD  Multiple Vitamins-Minerals (VITAMIN D3 COMPLETE PO) Take 1 tablet by mouth daily.   Yes [provider]  potassium chloride (KLOR-CON) 10 MEQ tablet Take 1 tablet (10 mEq total) by mouth daily. 03/07/21 06/05/21 Yes Satira Sark, MD  saccharomyces boulardii (FLORASTOR) 250 MG capsule Take 250 mg by mouth 2 (two) times daily.   Yes [provider]  XARELTO 15 MG TABS tablet Take 15 mg by mouth in the morning.  12/14/19  Yes [provider]    Physical Exam: BP (!) 144/77   Pulse 91   Temp 98.3 F (36.8 C) (Oral)   Resp (!) 27   Ht 5\' 2"  (1.575 m)   Wt 56.7 kg   SpO2 97%   BMI 22.86 kg/m   General: 87 y.o. year-old female well developed well nourished in no acute distress.  Alert and oriented x3. HEENT: NCAT, EOMI Neck: Supple, trachea medial Cardiovascular: Irregularly irregular rate and rhythm with no rubs or gallops.  No thyromegaly or JVD noted.  No lower extremity edema. 2/4 pulses in all 4 extremities. Respiratory: Tachypnea, diffuse coarse breath sounds.  No wheezes.  Abdomen: Soft, nontender nondistended with normal bowel sounds x4 quadrants. Muskuloskeletal: No cyanosis, clubbing or edema noted bilaterally Neuro: CN II-XII intact, strength 5/5 x 4, sensation, reflexes intact Skin: No ulcerative lesions noted or rashes Psychiatry: Mood is appropriate for condition and setting          Labs on Admission:  Basic Metabolic Panel: Recent Labs  Lab 05/26/21 1628  NA 138  K 3.7  CL 93*  CO2 34*  GLUCOSE 133*  BUN 15  CREATININE 0.60  CALCIUM 9.0   Liver Function Tests: No results for input(s): AST, ALT, ALKPHOS, BILITOT, PROT, ALBUMIN in the last 168 hours. No results for input(s): LIPASE, AMYLASE in the last 168 hours. No results for  input(s): AMMONIA in the last 168 hours. CBC: Recent Labs  Lab 05/26/21 1628  WBC 8.0  HGB 13.9  HCT 42.3  MCV 95.1  PLT 86*   Cardiac Enzymes: No results for input(s): CKTOTAL, CKMB, CKMBINDEX, TROPONINI in the last 168 hours.  BNP (last 3 results) Recent Labs    12/04/20 1652 05/26/21 1708  BNP 22.0 247.0*    ProBNP (last 3 results) No results for input(s): PROBNP in the last 8760 hours.  CBG: No results for input(s): GLUCAP in the last 168 hours.  Radiological Exams on Admission: DG Chest Port 1 View  Result Date: 05/26/2021 CLINICAL DATA:  Shortness of breath cough EXAM: PORTABLE CHEST 1 VIEW COMPARISON:  12/04/2020, 06/10/2020 FINDINGS: Trace right-sided pleural effusion. Chronic pleural calcification and scarring at left lung base. Cardiomegaly with aortic atherosclerosis. No acute airspace disease IMPRESSION: 1. Chronic calcified pleural plaques and scarring on the left 2. Cardiomegaly with probable trace right pleural effusion Electronically Signed   By: Donavan Foil M.D.   On: 05/26/2021 16:13  EKG: I independently viewed the EKG done and my findings are as followed: Atrial fibrillation with rate control with prolonged QTc (583 ms)  Assessment/Plan Present on Admission:  COVID-19 virus infection  Hypertension  Principal Problem:   COVID-19 virus infection Active Problems:   Hypertension   Atrial fibrillation, chronic (HCC)   Elevated brain natriuretic peptide (BNP) level   Prolonged QT interval   Thrombocytopenia (HCC)   Chronic respiratory failure with hypoxia (HCC)   COVID-19 virus infection Chronic respiratory failure with hypoxia Continue albuterol q.6h Continue IV Remdesivir per pharmacy protocol No indication for steroid at this time Continue vitamin-C 500 mg p.o. Daily Continue zinc 220 mg p.o. Daily Continue Mucinex, Robitussin and Tussionex Continue Tylenol p.r.n. for fever Continue supplemental oxygen to maintain O2 sat > or = 94% with  plan to wean patient off supplemental oxygen as tolerated Continue incentive spirometry and flutter valve q37min as tolerated Encourage proning, early ambulation, and side laying as tolerated Continue airborne isolation precaution Inflammatory markers: LDH:  149 CRP:  1.3 D-dimer: 0.70 Ferritin: 262 Continue monitoring daily inflammatory markers  Elevated BNP r/o CHF BNP 247; patient does not appear to be fluid overloaded Continue total input/output, daily weights and fluid restriction Continue Cardiac diet  Echocardiogram done on 04/16/2021 showed LVEF of 65 to 70%.  Diastolic dysfunction was suspected to be due to patient's elevated pulmonary pressures and tricuspid regurgitation resulting in peripheral edema and increased Lasix at that time per cardiologist's medical record.  Continue IV Lasix 40mg  daily (home regimen of 40 mg orally)  Limited echocardiogram will be done in the morning  Prolonged QTc (583 ms) Avoid QT prolonging drugs Magnesium level will be checked Repeat EKG in the morning  Chronic atrial fibrillation Cha2ds2-Vasc score = 4 Continue home Toprol Xarelto will be temporarily held due to platelets < 100  Chronic thrombocytopenia Platelets 86, continue to monitor platelet levels with morning labs  Essential hypertension Continue Toprol and Lasix as tolerated by BP  Other home meds: Florastor   DVT prophylaxis: SCDs  Code Status: DNR  Family Communication: None at bedside  Disposition Plan:  Patient is from:                        home Anticipated DC to:                   SNF or family members home Anticipated DC date:               2-3 days Anticipated DC barriers:          Patient requires inpatient management due to COVID-19 virus infection  Consults called: None  Admission status: Observation    Bernadette Hoit MD [facility]  05/26/2021, 8:36 PM

## 2021-05-26 NOTE — ED Notes (Signed)
Pt able to tolerate PO Meds with Jello or Applesauce. Per family member, Pt has difficulty with PO Meds at home.

## 2021-05-26 NOTE — ED Notes (Signed)
Date and time results received: 05/26/21 & 1900hrs   Test: COVID Critical Value: POSITIVE  Name of Provider Notified: EDP  Orders Received? Or Actions Taken?: Notified

## 2021-05-26 NOTE — ED Provider Notes (Signed)
[facility] Provider Note   CSN: [id number] Arrival date & time: 05/26/21  1457     History Chief Complaint  Patient presents with   Shortness of Breath    Kathryn Marquez is a 87 y.o. female.  HPI 87 year old female presents with cough and dyspnea.  Started yesterday.  Patient's son recently had COVID within the last 10 days.  The patient has not had a fever but did have a headache that responded to Tylenol.  Sometimes get some chest pain when she coughs.  No leg swelling.  She wears night oxygen but is feeling more short of breath than typical.   Past Medical History:  Diagnosis Date   Atrial fibrillation ([place])    Essential hypertension    History of stroke 04/29/2012   Recurrent Clostridioides difficile diarrhea    Thrombocytopenia (HCC)    TIA (transient ischemic attack)     Patient Active Problem List   Diagnosis Date Noted   COVID-19 virus infection 05/26/2021   Elevated brain natriuretic peptide (BNP) level 05/26/2021   Prolonged QT interval 05/26/2021   Thrombocytopenia (HCC) 05/26/2021   Chronic respiratory failure with hypoxia (HCC) 05/26/2021   Recurrent Clostridioides difficile diarrhea 40/98/1191   Acute metabolic encephalopathy 47/82/9562   Dehydration 05/09/2020   Colitis 05/08/2020   C. 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) 04/29/2012   CLOSED COLLES FRACTURE 08/02/2010    Past Surgical History:  Procedure Laterality Date   No prior surgery       OB History   No obstetric history on file.      Family History  Problem Relation Age of Onset   Stroke Other    Cancer Other    Heart attack Mother    Fibromyalgia Mother    Hypercholesterolemia Mother    Leukemia Sister    Cancer Brother    Stroke Father    Heart attack Brother    Diabetes Son    Fibromyalgia Son    Hypertension Son     Social History   Tobacco Use   Smoking status: Never   Smokeless tobacco: Never  Vaping Use   Vaping Use: Never used  Substance Use Topics   Alcohol use: No   Drug use: No    Home Medications Prior to Admission medications   Medication Sig Start Date End Date Taking? Authorizing Provider  ascorbic acid (VITAMIN C) 500 MG tablet Take 500 mg by mouth daily.   Yes [provider]  calcium-vitamin D (OSCAL WITH D) 500-200 MG-UNIT tablet Take 1 tablet by mouth daily with breakfast.   Yes [provider]  cetirizine (ZYRTEC) 10 MG tablet Take 10 mg by mouth in the morning.    Yes [provider]  Cyanocobalamin (B-12 PO) Take 1 tablet by mouth daily.   Yes [provider]  furosemide (LASIX) 40 MG tablet Take 1 tablet (40 mg total) by mouth daily. 03/07/21 06/05/21 Yes Satira Sark, MD  metoprolol succinate (TOPROL-XL) 25 MG 24 hr tablet Take 1 tablet (25 mg total) by mouth daily. New  blood pressure medicine. 04/30/12 07/21/21 Yes Rexene Alberts, MD  Multiple Vitamins-Minerals (VITAMIN D3 COMPLETE PO) Take 1 tablet by mouth daily.   Yes [provider]  potassium chloride (KLOR-CON) 10 MEQ tablet Take 1 tablet (10 mEq total) by mouth daily. 03/07/21 06/05/21 Yes Satira Sark, MD  saccharomyces boulardii (FLORASTOR) 250 MG capsule Take 250 mg by mouth 2 (two) times daily.   Yes [provider]  XARELTO 15 MG TABS tablet Take 15 mg by mouth in the morning.  12/14/19  Yes [provider]    Allergies    Patient has no known allergies.  Review of Systems   Review of Systems  Constitutional:  Negative for fever.   Respiratory:  Positive for cough and shortness of breath.   Cardiovascular:  Positive for chest pain. Negative for leg swelling.  Neurological:  Positive for headaches.  All other systems reviewed and are negative.  Physical Exam Updated Vital Signs BP (!) 144/59   Pulse (!) 46   Temp 98.3 F (36.8 C) (Oral)   Resp (!) 27   Ht 5\' 2"  (1.575 m)   Wt 56.7 kg   SpO2 95%   BMI 22.86 kg/m   Physical Exam Vitals and nursing note reviewed.  Constitutional:      General: She is not in acute distress.    Appearance: She is well-developed. She is not ill-appearing or diaphoretic.  HENT:     Head: Normocephalic and atraumatic.     Right Ear: External ear normal.     Left Ear: External ear normal.     Nose: Nose normal.  Eyes:     General:        Right eye: No discharge.        Left eye: No discharge.  Cardiovascular:     Rate and Rhythm: Normal rate. Rhythm irregular.     Heart sounds: Normal heart sounds.  Pulmonary:     Effort: Pulmonary effort is normal. Tachypnea present. No accessory muscle usage or respiratory distress.     Breath sounds: Decreased breath sounds (mild, bases) present.  Abdominal:     Palpations: Abdomen is soft.     Tenderness: There is no abdominal tenderness.  Musculoskeletal:     Right lower leg: No edema.     Left lower leg: No edema.  Skin:    General: Skin is warm and dry.  Neurological:     Mental Status: She is alert.  Psychiatric:        Mood and Affect: Mood is not anxious.    ED Results / Procedures / Treatments   Labs (all labs ordered are listed, but only abnormal results are displayed) Labs Reviewed  RESP PANEL BY RT-PCR (FLU A&B, COVID) ARPGX2 - Abnormal; Notable for the following components:      Result Value   SARS Coronavirus 2 by RT PCR POSITIVE (*)    All other components within normal limits  CBC - Abnormal; Notable for the following components:   Platelets 86 (*)    All other components within normal limits  BASIC  METABOLIC PANEL - Abnormal; Notable for the following components:   Chloride 93 (*)    CO2 34 (*)    Glucose, Bld 133 (*)    All other components within normal limits  BRAIN NATRIURETIC PEPTIDE - Abnormal; Notable for the following components:   B Natriuretic Peptide 247.0 (*)    All other components within normal limits  COMPREHENSIVE METABOLIC PANEL - Abnormal;  Notable for the following components:   Potassium 3.4 (*)    Chloride 94 (*)    CO2 35 (*)    Glucose, Bld 140 (*)    Calcium 8.6 (*)    Total Bilirubin 1.5 (*)    All other components within normal limits  D-DIMER, QUANTITATIVE - Abnormal; Notable for the following components:   D-Dimer, Quant 0.70 (*)    All other components within normal limits  C-REACTIVE PROTEIN - Abnormal; Notable for the following components:   CRP 1.3 (*)    All other components within normal limits  CULTURE, BLOOD (ROUTINE X 2)  CULTURE, BLOOD (ROUTINE X 2)  LACTIC ACID, PLASMA  LACTIC ACID, PLASMA  PROCALCITONIN  LACTATE DEHYDROGENASE  FERRITIN  TRIGLYCERIDES  FIBRINOGEN  MAGNESIUM  CBC WITH DIFFERENTIAL/PLATELET  COMPREHENSIVE METABOLIC PANEL  C-REACTIVE PROTEIN  D-DIMER, QUANTITATIVE  FERRITIN  MAGNESIUM  PHOSPHORUS  TROPONIN I (HIGH SENSITIVITY)  TROPONIN I (HIGH SENSITIVITY)    EKG EKG Interpretation  Date/Time:  Saturday May 26 2021 16:15:18 EDT Ventricular Rate:  89 PR Interval:    QRS Duration: 89 QT Interval:  479 QTC Calculation: 583 R Axis:   114 Text Interpretation: Atrial fibrillation Right axis deviation Borderline repolarization abnormality Prolonged QT interval similar to June 2021 Confirmed by Sherwood Gambler [phone]) on 05/26/2021 4:47:27 PM  Radiology DG Chest Port 1 View  Result Date: 05/26/2021 CLINICAL DATA:  Shortness of breath cough EXAM: PORTABLE CHEST 1 VIEW COMPARISON:  12/04/2020, 06/10/2020 FINDINGS: Trace right-sided pleural effusion. Chronic pleural calcification and scarring at left lung base.  Cardiomegaly with aortic atherosclerosis. No acute airspace disease IMPRESSION: 1. Chronic calcified pleural plaques and scarring on the left 2. Cardiomegaly with probable trace right pleural effusion Electronically Signed   By: Donavan Foil M.D.   On: 05/26/2021 16:13    Procedures Procedures   Medications Ordered in ED Medications  albuterol (VENTOLIN HFA) 108 (90 Base) MCG/ACT inhaler 2 puff (2 puffs Inhalation Given 05/26/21 2038)  guaiFENesin-dextromethorphan (ROBITUSSIN DM) 100-10 MG/5ML syrup 10 mL (10 mLs Oral Given 05/26/21 2030)  chlorpheniramine-HYDROcodone (TUSSIONEX) 10-8 MG/5ML suspension 5 mL (has no administration in time range)  ascorbic acid (VITAMIN C) tablet 500 mg (500 mg Oral Given 05/26/21 2029)  zinc sulfate capsule 220 mg (220 mg Oral Given 05/26/21 2030)  pantoprazole (PROTONIX) injection 40 mg (40 mg Intravenous Given 05/26/21 2030)  acetaminophen (TYLENOL) tablet 650 mg (has no administration in time range)  remdesivir 100 mg in sodium chloride 0.9 % 100 mL IVPB (0 mg Intravenous Stopped 05/26/21 2249)    Followed by  remdesivir 100 mg in sodium chloride 0.9 % 100 mL IVPB (has no administration in time range)  furosemide (LASIX) injection 40 mg (40 mg Intravenous Given 05/26/21 2249)  albuterol (VENTOLIN HFA) 108 (90 Base) MCG/ACT inhaler 2 puff (2 puffs Inhalation Given 05/26/21 1820)    ED Course  I have reviewed the triage vital signs and the nursing notes.  Pertinent labs & imaging results that were available during my care of the patient were reviewed by me and considered in my medical decision making (see chart for details).    MDM Rules/Calculators/A&P                          Patient is nondistressed but does have some increased work of breathing.  She is requiring her 2 L all the time as opposed to just at night.  Given she is not specifically hypoxic  I do not think she needs prednisone/steroids.  However she is having some increased work of breathing and  poor cough and with her age and comorbidities it might be best to keep her in the hospital and give her remdesivir.  Dr. Bryn Gulling to admit. Final Clinical Impression(s) / ED Diagnoses Final diagnoses:  COVID-19 virus infection    Rx / DC Orders ED Discharge Orders     None        Sherwood Gambler, MD 05/26/21 [phone]

## 2021-05-27 ENCOUNTER — Inpatient Hospital Stay (HOSPITAL_COMMUNITY): Payer: PPO

## 2021-05-27 ENCOUNTER — Other Ambulatory Visit (HOSPITAL_COMMUNITY): Payer: Self-pay | Admitting: Internal Medicine

## 2021-05-27 DIAGNOSIS — H9190 Unspecified hearing loss, unspecified ear: Secondary | ICD-10-CM | POA: Diagnosis present

## 2021-05-27 DIAGNOSIS — Z79899 Other long term (current) drug therapy: Secondary | ICD-10-CM | POA: Diagnosis not present

## 2021-05-27 DIAGNOSIS — D6959 Other secondary thrombocytopenia: Secondary | ICD-10-CM | POA: Diagnosis present

## 2021-05-27 DIAGNOSIS — Z66 Do not resuscitate: Secondary | ICD-10-CM | POA: Diagnosis present

## 2021-05-27 DIAGNOSIS — U071 COVID-19: Secondary | ICD-10-CM | POA: Diagnosis present

## 2021-05-27 DIAGNOSIS — Z806 Family history of leukemia: Secondary | ICD-10-CM | POA: Diagnosis not present

## 2021-05-27 DIAGNOSIS — Z823 Family history of stroke: Secondary | ICD-10-CM | POA: Diagnosis not present

## 2021-05-27 DIAGNOSIS — Z8249 Family history of ischemic heart disease and other diseases of the circulatory system: Secondary | ICD-10-CM | POA: Diagnosis not present

## 2021-05-27 DIAGNOSIS — E785 Hyperlipidemia, unspecified: Secondary | ICD-10-CM | POA: Diagnosis present

## 2021-05-27 DIAGNOSIS — Z83438 Family history of other disorder of lipoprotein metabolism and other lipidemia: Secondary | ICD-10-CM | POA: Diagnosis not present

## 2021-05-27 DIAGNOSIS — I5033 Acute on chronic diastolic (congestive) heart failure: Secondary | ICD-10-CM | POA: Diagnosis not present

## 2021-05-27 DIAGNOSIS — I482 Chronic atrial fibrillation, unspecified: Secondary | ICD-10-CM | POA: Diagnosis present

## 2021-05-27 DIAGNOSIS — Z833 Family history of diabetes mellitus: Secondary | ICD-10-CM | POA: Diagnosis not present

## 2021-05-27 DIAGNOSIS — L89311 Pressure ulcer of right buttock, stage 1: Secondary | ICD-10-CM | POA: Diagnosis present

## 2021-05-27 DIAGNOSIS — J9611 Chronic respiratory failure with hypoxia: Secondary | ICD-10-CM | POA: Diagnosis present

## 2021-05-27 DIAGNOSIS — Z8673 Personal history of transient ischemic attack (TIA), and cerebral infarction without residual deficits: Secondary | ICD-10-CM | POA: Diagnosis not present

## 2021-05-27 DIAGNOSIS — Z7901 Long term (current) use of anticoagulants: Secondary | ICD-10-CM | POA: Diagnosis not present

## 2021-05-27 DIAGNOSIS — R7989 Other specified abnormal findings of blood chemistry: Secondary | ICD-10-CM | POA: Diagnosis not present

## 2021-05-27 DIAGNOSIS — L89321 Pressure ulcer of left buttock, stage 1: Secondary | ICD-10-CM | POA: Diagnosis present

## 2021-05-27 DIAGNOSIS — I10 Essential (primary) hypertension: Secondary | ICD-10-CM | POA: Diagnosis present

## 2021-05-27 DIAGNOSIS — R0602 Shortness of breath: Secondary | ICD-10-CM | POA: Diagnosis present

## 2021-05-27 DIAGNOSIS — E876 Hypokalemia: Secondary | ICD-10-CM | POA: Diagnosis not present

## 2021-05-27 LAB — CBC WITH DIFFERENTIAL/PLATELET
Abs Immature Granulocytes: 0.08 10*3/uL — ABNORMAL HIGH (ref 0.00–0.07)
Basophils Absolute: 0 10*3/uL (ref 0.0–0.1)
Basophils Relative: 0 %
Eosinophils Absolute: 0 10*3/uL (ref 0.0–0.5)
Eosinophils Relative: 0 %
HCT: 42.7 % (ref 36.0–46.0)
Hemoglobin: 13.9 g/dL (ref 12.0–15.0)
Immature Granulocytes: 1 %
Lymphocytes Relative: 8 %
Lymphs Abs: 0.8 10*3/uL (ref 0.7–4.0)
MCH: 30.8 pg (ref 26.0–34.0)
MCHC: 32.6 g/dL (ref 30.0–36.0)
MCV: 94.5 fL (ref 80.0–100.0)
Monocytes Absolute: 0.8 10*3/uL (ref 0.1–1.0)
Monocytes Relative: 7 %
Neutro Abs: 9.1 10*3/uL — ABNORMAL HIGH (ref 1.7–7.7)
Neutrophils Relative %: 84 %
Platelets: 100 10*3/uL — ABNORMAL LOW (ref 150–400)
RBC: 4.52 MIL/uL (ref 3.87–5.11)
RDW: 13.8 % (ref 11.5–15.5)
WBC: 10.8 10*3/uL — ABNORMAL HIGH (ref 4.0–10.5)
nRBC: 0 % (ref 0.0–0.2)

## 2021-05-27 LAB — COMPREHENSIVE METABOLIC PANEL
ALT: 15 U/L (ref 0–44)
AST: 25 U/L (ref 15–41)
Albumin: 3.6 g/dL (ref 3.5–5.0)
Alkaline Phosphatase: 52 U/L (ref 38–126)
Anion gap: 12 (ref 5–15)
BUN: 15 mg/dL (ref 8–23)
CO2: 36 mmol/L — ABNORMAL HIGH (ref 22–32)
Calcium: 8.8 mg/dL — ABNORMAL LOW (ref 8.9–10.3)
Chloride: 91 mmol/L — ABNORMAL LOW (ref 98–111)
Creatinine, Ser: 0.79 mg/dL (ref 0.44–1.00)
GFR, Estimated: >60 mL/min (ref >60)
Glucose, Bld: 127 mg/dL — ABNORMAL HIGH (ref 70–99)
Potassium: 3.4 mmol/L — ABNORMAL LOW (ref 3.5–5.1)
Sodium: 139 mmol/L (ref 135–145)
Total Bilirubin: 1.2 mg/dL (ref 0.3–1.2)
Total Protein: 6.6 g/dL (ref 6.5–8.1)

## 2021-05-27 LAB — MAGNESIUM: Magnesium: 1.8 mg/dL (ref 1.7–2.4)

## 2021-05-27 LAB — D-DIMER, QUANTITATIVE: D-Dimer, Quant: 0.76 ug/mL-FEU — ABNORMAL HIGH (ref 0.00–0.50)

## 2021-05-27 LAB — ECHOCARDIOGRAM LIMITED
Area-P 1/2: 5.16 cm2
Height: 62 in
S' Lateral: 2.99 cm
Weight: 2000 oz

## 2021-05-27 LAB — PHOSPHORUS: Phosphorus: 3.7 mg/dL (ref 2.5–4.6)

## 2021-05-27 LAB — C-REACTIVE PROTEIN: CRP: 6.3 mg/dL — ABNORMAL HIGH (ref <1.0)

## 2021-05-27 LAB — FERRITIN: Ferritin: 275 ng/mL (ref 11–307)

## 2021-05-27 MED ORDER — METOPROLOL SUCCINATE ER 25 MG PO TB24
25.0000 mg | ORAL_TABLET | Freq: Every day | ORAL | Status: DC
  Administered 2021-05-28 – 2021-05-30 (×3): 25 mg via ORAL
  Filled 2021-05-27 (×4): qty 1

## 2021-05-27 MED ORDER — SACCHAROMYCES BOULARDII 250 MG PO CAPS
250.0000 mg | ORAL_CAPSULE | Freq: Two times a day (BID) | ORAL | Status: DC
  Administered 2021-05-27 – 2021-05-30 (×7): 250 mg via ORAL
  Filled 2021-05-27 (×7): qty 1

## 2021-05-27 MED ORDER — POTASSIUM CHLORIDE CRYS ER 20 MEQ PO TBCR
40.0000 meq | EXTENDED_RELEASE_TABLET | Freq: Once | ORAL | Status: AC
  Administered 2021-05-27: 40 meq via ORAL
  Filled 2021-05-27: qty 2

## 2021-05-27 NOTE — Progress Notes (Signed)
Pt up OOB to recliner, chair alarm in place. Pt tolerated moving well, stood without assist, takes small shuffling steps but stable with only standby assist. Pt given am meds in applesauce, tolerated well. Has spoken with both her daughter and her son by phone this am. Pt states that her cough is much better than yesterday. Still slow to answer questions, unsure if this is due to confusion or hearing difficulty. Otherwise appropriate.

## 2021-05-27 NOTE — Progress Notes (Signed)
  Echocardiogram 2D Echocardiogram has been performed.  Kathryn Marquez Kathryn Marquez 05/27/2021, 2:06 PM

## 2021-05-27 NOTE — ED Notes (Signed)
Pts daughter Enid Derry updated on Pts bed placement on third floor.

## 2021-05-27 NOTE — Progress Notes (Signed)
Pt's daughter Enid Derry is at bedside. Provided update on current pt condition and plan of care. Daughter states understanding. Pt denies c/o at this time, resting quietly in bed.

## 2021-05-27 NOTE — Progress Notes (Signed)
PROGRESS NOTE    Kathryn Marquez  JIR:[id number] DOB: 24-Mar-1921 DOA: 05/26/2021 PCP: Redmond School, MD   Brief Narrative:   Kathryn Marquez is a 87 y.o. female with medical history significant for chronic respiratory failure with hypoxia on supplemental oxygen brain CT at 2 PM, chronic atrial fibrillation on Xarelto, hypertension, thrombocytopenia and prior CVA who presents to the emergency department accompanied by daughter due to worsening shortness of breath.  Patient was admitted with symptomatic COVID-19 virus infection.  She is not significantly hypoxemic from her usual baseline.  Assessment & Plan:   Principal Problem:   COVID-19 virus infection Active Problems:   Hypertension   Atrial fibrillation, chronic (HCC)   Elevated brain natriuretic peptide (BNP) level   Prolonged QT interval   Thrombocytopenia (HCC)   Chronic respiratory failure with hypoxia (HCC)   COVID-19 virus infection -Patient noted to have chronic hypoxemia on 2 L nasal cannula at home -Continue IV remdesivir -Continue supportive care as ordered -Follow-up a.m. inflammatory markers -Breathing treatments ordered  Elevated BNP -Does not appear to have signs of CHF at this time -With soft blood pressure readings, hold Lasix -Limited 2D echocardiogram pending  Chronic atrial fibrillation -Continue home Toprol -Xarelto temporarily held due to thrombocytopenia, plan to resume on discharge  Mild hypokalemia -Replete and reevaluate in a.m. along with magnesium  Chronic thrombocytopenia -Continue monitor repeat CBC -Appears to be improving  Essential hypertension -Continue Toprol and monitor soft blood pressure readings, Lasix currently held   DVT prophylaxis: SCDs Code Status: DNR Family Communication:  Disposition Plan:  Status is: Observation  The patient will require care spanning > 2 midnights and should be moved to inpatient because: IV treatments appropriate due to intensity of illness  or inability to take PO and Inpatient level of care appropriate due to severity of illness  Dispo: The patient is from: Home              Anticipated d/c is to: Home              Patient currently is not medically stable to d/c.   Difficult to place patient No   Skin Assessment:  I have examined the patient's skin and I agree with the wound assessment as performed by the wound care RN as outlined below:  Pressure Injury 05/27/21 Buttocks Mid Stage 1 -  Intact skin with non-blanchable redness of a localized area usually over a bony prominence. (Active)  05/27/21 0125  Location: Buttocks  Location Orientation: Mid  Staging: Stage 1 -  Intact skin with non-blanchable redness of a localized area usually over a bony prominence.  Wound Description (Comments):   Present on Admission: Yes    Consultants:  None  Procedures:  See below  Antimicrobials:  Anti-infectives (From admission, onward)    Start     Dose/Rate Route Frequency Ordered Stop   05/27/21 1000  remdesivir 100 mg in sodium chloride 0.9 % 100 mL IVPB       See Hyperspace for full Linked Orders Report.   100 mg 200 mL/hr over 30 Minutes Intravenous Daily 05/26/21 2003 05/31/21 0959   05/26/21 2100  remdesivir 100 mg in sodium chloride 0.9 % 100 mL IVPB       See Hyperspace for full Linked Orders Report.   100 mg 200 mL/hr over 30 Minutes Intravenous Every 1 hr x 2 05/26/21 2003 05/26/21 2249       Subjective: Patient seen and evaluated today with no new acute complaints  or concerns. No acute concerns or events noted overnight.  She states that her breathing has improved.  Objective: Vitals:   05/26/21 2340 05/27/21 0034 05/27/21 0620 05/27/21 0754  BP: (!) 144/59 120/70 (!) 99/59   Pulse: (!) 49 77 82   Resp: (!) 33 18 18   Temp: 98.3 F (36.8 C) 99.3 F (37.4 C) 98.4 F (36.9 C)   TempSrc: Oral Oral    SpO2: 94% 98% 100% 99%  Weight:      Height:        Intake/Output Summary (Last 24 hours) at  05/27/2021 0920 Last data filed at 05/26/2021 2249 Gross per 24 hour  Intake 200 ml  Output --  Net 200 ml   Filed Weights   05/26/21 1507  Weight: 56.7 kg    Examination:  General exam: Appears calm and comfortable, hard of hearing, minimally Respiratory system: Clear to auscultation. Respiratory effort normal. On 2L [place] Cardiovascular system: S1 & S2 heard, irregular rate Gastrointestinal system: Abdomen is soft Central nervous system: Alert and awake Extremities: No edema Skin: No significant lesions noted Psychiatry: Flat affect.    Data Reviewed: I have personally reviewed following labs and imaging studies  CBC: Recent Labs  Lab 05/26/21 1628 05/27/21 0600  WBC 8.0 10.8*  NEUTROABS  --  9.1*  HGB 13.9 13.9  HCT 42.3 42.7  MCV 95.1 94.5  PLT 86* 433*   Basic Metabolic Panel: Recent Labs  Lab 05/26/21 1628 05/26/21 2037 05/27/21 0600  NA 138 138 139  K 3.7 3.4* 3.4*  CL 93* 94* 91*  CO2 34* 35* 36*  GLUCOSE 133* 140* 127*  BUN 15 14 15   CREATININE 0.60 0.69 0.79  CALCIUM 9.0 8.6* 8.8*  MG  --  1.7 1.8  PHOS  --   --  3.7   GFR: Estimated Creatinine Clearance: 31.8 mL/min (by C-G formula based on SCr of 0.79 mg/dL). Liver Function Tests: Recent Labs  Lab 05/26/21 2037 05/27/21 0600  AST 27 25  ALT 16 15  ALKPHOS 54 52  BILITOT 1.5* 1.2  PROT 6.7 6.6  ALBUMIN 3.8 3.6   No results for input(s): LIPASE, AMYLASE in the last 168 hours. No results for input(s): AMMONIA in the last 168 hours. Coagulation Profile: No results for input(s): INR, PROTIME in the last 168 hours. Cardiac Enzymes: No results for input(s): CKTOTAL, CKMB, CKMBINDEX, TROPONINI in the last 168 hours. BNP (last 3 results) No results for input(s): PROBNP in the last 8760 hours. HbA1C: No results for input(s): HGBA1C in the last 72 hours. CBG: No results for input(s): GLUCAP in the last 168 hours. Lipid Profile: Recent Labs    05/26/21 2037  TRIG 37   Thyroid Function  Tests: No results for input(s): TSH, T4TOTAL, FREET4, T3FREE, THYROIDAB in the last 72 hours. Anemia Panel: Recent Labs    05/26/21 2037 05/27/21 0600  FERRITIN 262 275   Sepsis Labs: Recent Labs  Lab 05/26/21 1834 05/26/21 2037  PROCALCITON  --  <0.10  LATICACIDVEN 1.3 1.2    Recent Results (from the past 240 hour(s))  Resp Panel by RT-PCR (Flu A&B, Covid) Nasopharyngeal Swab     Status: Abnormal   Collection Time: 05/26/21  3:53 PM   Specimen: Nasopharyngeal Swab; Nasopharyngeal(NP) swabs in vial transport medium  Result Value Ref Range Status   SARS Coronavirus 2 by RT PCR POSITIVE (A) NEGATIVE Final    Comment: RESULT CALLED TO, READ BACK BY AND VERIFIED WITH: LONG,L @ 2951  ON 05/26/21 BY JUW (NOTE) SARS-CoV-2 target nucleic acids are DETECTED.  The SARS-CoV-2 RNA is generally detectable in upper respiratory specimens during the acute phase of infection. Positive results are indicative of the presence of the identified virus, but do not rule out bacterial infection or co-infection with other pathogens not detected by the test. Clinical correlation with patient history and other diagnostic information is necessary to determine patient infection status. The expected result is Negative.  Fact Sheet for Patients: EntrepreneurPulse.com.au  Fact Sheet for Healthcare Providers: IncredibleEmployment.be  This test is not yet approved or cleared by the [place] FDA and  has been authorized for detection and/or diagnosis of SARS-CoV-2 by FDA under an Emergency Use Authorization (EUA).  This EUA will remain in effect (meaning this test can be u sed) for the duration of  the COVID-19 declaration under Section 564(b)(1) of the Act, 21 U.S.C. section 360bbb-3(b)(1), unless the authorization is terminated or revoked sooner.     Influenza A by PCR NEGATIVE NEGATIVE Final   Influenza B by PCR NEGATIVE NEGATIVE Final    Comment:  (NOTE) The Xpert Xpress SARS-CoV-2/FLU/RSV plus assay is intended as an aid in the diagnosis of influenza from Nasopharyngeal swab specimens and should not be used as a sole basis for treatment. Nasal washings and aspirates are unacceptable for Xpert Xpress SARS-CoV-2/FLU/RSV testing.  Fact Sheet for Patients: EntrepreneurPulse.com.au  Fact Sheet for Healthcare Providers: IncredibleEmployment.be  This test is not yet approved or cleared by the [place] FDA and has been authorized for detection and/or diagnosis of SARS-CoV-2 by FDA under an Emergency Use Authorization (EUA). This EUA will remain in effect (meaning this test can be used) for the duration of the COVID-19 declaration under Section 564(b)(1) of the Act, 21 U.S.C. section 360bbb-3(b)(1), unless the authorization is terminated or revoked.  Performed at [facility], [address] [place], [place] [address]   Blood Culture (routine x 2)     Status: None (Preliminary result)   Collection Time: 05/26/21  8:32 PM   Specimen: BLOOD RIGHT HAND  Result Value Ref Range Status   Specimen Description BLOOD RIGHT HAND  Final   Special Requests   Final    BOTTLES DRAWN AEROBIC ONLY Blood Culture adequate volume   Culture   Final    NO GROWTH < 12 HOURS Performed at [facility], [address], [place], [place] [address]    Report Status PENDING  Incomplete  Blood Culture (routine x 2)     Status: None (Preliminary result)   Collection Time: 05/26/21  8:37 PM   Specimen: Left Antecubital; Blood  Result Value Ref Range Status   Specimen Description LEFT ANTECUBITAL  Final   Special Requests   Final    BOTTLES DRAWN AEROBIC AND ANAEROBIC Blood Culture adequate volume   Culture   Final    NO GROWTH < 12 HOURS Performed at [facility], [address], [place], [place] [address]    Report Status PENDING  Incomplete         Radiology Studies: DG Chest Port 1 View  Result Date:  05/26/2021 CLINICAL DATA:  Shortness of breath cough EXAM: PORTABLE CHEST 1 VIEW COMPARISON:  12/04/2020, 06/10/2020 FINDINGS: Trace right-sided pleural effusion. Chronic pleural calcification and scarring at left lung base. Cardiomegaly with aortic atherosclerosis. No acute airspace disease IMPRESSION: 1. Chronic calcified pleural plaques and scarring on the left 2. Cardiomegaly with probable trace right pleural effusion Electronically Signed   By: Madie Reno.D.  On: 05/26/2021 16:13        Scheduled Meds:  albuterol  2 puff Inhalation Q6H   vitamin C  500 mg Oral Daily   furosemide  40 mg Intravenous Daily   metoprolol succinate  25 mg Oral Daily   pantoprazole (PROTONIX) IV  40 mg Intravenous QHS   potassium chloride  40 mEq Oral Once   saccharomyces boulardii  250 mg Oral BID   zinc sulfate  220 mg Oral Daily   Continuous Infusions:  remdesivir 100 mg in NS 100 mL       LOS: 0 days    Time spent: 35 minutes    Casi Westerfeld Darleen Crocker, DO [facility]  If 7PM-7AM, please contact night-coverage [email or web address] 05/27/2021, 9:20 AM '

## 2021-05-28 LAB — CBC WITH DIFFERENTIAL/PLATELET
Abs Immature Granulocytes: 0.15 10*3/uL — ABNORMAL HIGH (ref 0.00–0.07)
Basophils Absolute: 0 10*3/uL (ref 0.0–0.1)
Basophils Relative: 0 %
Eosinophils Absolute: 0 10*3/uL (ref 0.0–0.5)
Eosinophils Relative: 0 %
HCT: 43 % (ref 36.0–46.0)
Hemoglobin: 13.8 g/dL (ref 12.0–15.0)
Immature Granulocytes: 1 %
Lymphocytes Relative: 7 %
Lymphs Abs: 1 10*3/uL (ref 0.7–4.0)
MCH: 30.1 pg (ref 26.0–34.0)
MCHC: 32.1 g/dL (ref 30.0–36.0)
MCV: 93.7 fL (ref 80.0–100.0)
Monocytes Absolute: 0.9 10*3/uL (ref 0.1–1.0)
Monocytes Relative: 7 %
Neutro Abs: 12.1 10*3/uL — ABNORMAL HIGH (ref 1.7–7.7)
Neutrophils Relative %: 85 %
Platelets: 107 10*3/uL — ABNORMAL LOW (ref 150–400)
RBC: 4.59 MIL/uL (ref 3.87–5.11)
RDW: 13.8 % (ref 11.5–15.5)
WBC: 14.2 10*3/uL — ABNORMAL HIGH (ref 4.0–10.5)
nRBC: 0 % (ref 0.0–0.2)

## 2021-05-28 LAB — COMPREHENSIVE METABOLIC PANEL
ALT: 14 U/L (ref 0–44)
AST: 22 U/L (ref 15–41)
Albumin: 3.4 g/dL — ABNORMAL LOW (ref 3.5–5.0)
Alkaline Phosphatase: 51 U/L (ref 38–126)
Anion gap: 10 (ref 5–15)
BUN: 28 mg/dL — ABNORMAL HIGH (ref 8–23)
CO2: 35 mmol/L — ABNORMAL HIGH (ref 22–32)
Calcium: 9.1 mg/dL (ref 8.9–10.3)
Chloride: 91 mmol/L — ABNORMAL LOW (ref 98–111)
Creatinine, Ser: 0.69 mg/dL (ref 0.44–1.00)
GFR, Estimated: >60 mL/min (ref >60)
Glucose, Bld: 113 mg/dL — ABNORMAL HIGH (ref 70–99)
Potassium: 3.7 mmol/L (ref 3.5–5.1)
Sodium: 136 mmol/L (ref 135–145)
Total Bilirubin: 1.4 mg/dL — ABNORMAL HIGH (ref 0.3–1.2)
Total Protein: 6.2 g/dL — ABNORMAL LOW (ref 6.5–8.1)

## 2021-05-28 LAB — MAGNESIUM: Magnesium: 1.8 mg/dL (ref 1.7–2.4)

## 2021-05-28 LAB — D-DIMER, QUANTITATIVE: D-Dimer, Quant: 0.77 ug/mL-FEU — ABNORMAL HIGH (ref 0.00–0.50)

## 2021-05-28 LAB — FERRITIN: Ferritin: 290 ng/mL (ref 11–307)

## 2021-05-28 LAB — C-REACTIVE PROTEIN: CRP: 10.4 mg/dL — ABNORMAL HIGH (ref <1.0)

## 2021-05-28 LAB — PHOSPHORUS: Phosphorus: 2 mg/dL — ABNORMAL LOW (ref 2.5–4.6)

## 2021-05-28 MED ORDER — RIVAROXABAN 15 MG PO TABS
15.0000 mg | ORAL_TABLET | Freq: Every morning | ORAL | Status: DC
  Administered 2021-05-28 – 2021-05-30 (×3): 15 mg via ORAL
  Filled 2021-05-28 (×3): qty 1

## 2021-05-28 MED ORDER — MUSCLE RUB 10-15 % EX CREA
1.0000 "application " | TOPICAL_CREAM | CUTANEOUS | Status: DC | PRN
  Filled 2021-05-28: qty 85

## 2021-05-28 MED ORDER — METHYLPREDNISOLONE SODIUM SUCC 40 MG IJ SOLR
40.0000 mg | Freq: Two times a day (BID) | INTRAMUSCULAR | Status: DC
  Administered 2021-05-28 – 2021-05-30 (×5): 40 mg via INTRAVENOUS
  Filled 2021-05-28 (×5): qty 1

## 2021-05-28 MED ORDER — ALBUTEROL SULFATE HFA 108 (90 BASE) MCG/ACT IN AERS: 2.0000 | INHALATION_SPRAY | RESPIRATORY_TRACT | Status: DC | PRN

## 2021-05-28 MED ORDER — MENTHOL 3 MG MT LOZG: 1.0000 | LOZENGE | OROMUCOSAL | Status: DC | PRN

## 2021-05-28 MED ORDER — PHENOL 1.4 % MT LIQD
1.0000 | OROMUCOSAL | Status: DC | PRN
  Administered 2021-05-28: 1 via OROMUCOSAL
  Filled 2021-05-28: qty 177

## 2021-05-28 MED ORDER — DICLOFENAC SODIUM 1 % EX GEL
2.0000 g | Freq: Four times a day (QID) | CUTANEOUS | Status: DC
  Administered 2021-05-28 – 2021-05-30 (×9): 2 g via TOPICAL
  Filled 2021-05-28: qty 100

## 2021-05-28 NOTE — Progress Notes (Signed)
PROGRESS NOTE    Kathryn Marquez  LJQ:[id number] DOB: [birth year] DOA: 05/26/2021 PCP: Redmond School, MD   Brief Narrative:   Kathryn Marquez is a 87 y.o. female with medical history significant for chronic respiratory failure with hypoxia on supplemental oxygen brain CT at 2 PM, chronic atrial fibrillation on Xarelto, hypertension, thrombocytopenia and prior CVA who presents to the emergency department accompanied by daughter due to worsening shortness of breath.  Patient was admitted with symptomatic COVID-19 virus infection.  She is not significantly hypoxemic from her usual baseline.  Assessment & Plan:   Principal Problem:   COVID-19 virus infection Active Problems:   Hypertension   Atrial fibrillation, chronic (HCC)   Elevated brain natriuretic peptide (BNP) level   Prolonged QT interval   Thrombocytopenia (HCC)   Chronic respiratory failure with hypoxia ([place])   COVID-19   COVID-19 virus infection -Inflammatory markers are uptrending; plan to start solumedrol 7/18 -Patient noted to have chronic hypoxemia on 2 L nasal cannula at home -Continue IV remdesivir -Continue supportive care as ordered -Follow-up a.m. inflammatory markers -Breathing treatments ordered   Elevated BNP -Does not appear to have signs of CHF at this time -With soft blood pressure readings, hold Lasix -Limited 2D echocardiogram with LVEF 55-60%   Chronic atrial fibrillation -Continue home Toprol -Xarelto to be resumed 7/18   Chronic thrombocytopenia-stable -Continue monitor repeat CBC   Essential hypertension -Continue Toprol and monitor soft blood pressure readings, Lasix currently held     DVT prophylaxis: Xarelto Code Status: DNR Family Communication: Daughter on phone 7/18 Disposition Plan:  Status is: Inpatient  Remains inpatient appropriate because:IV treatments appropriate due to intensity of illness or inability to take PO  Dispo: The patient is from: Home               Anticipated d/c is to: Home              Patient currently is not medically stable to d/c.   Difficult to place patient No      Skin Assessment:   I have examined the patient's skin and I agree with the wound assessment as performed by the wound care RN as outlined below:   Pressure Injury 05/27/21 Buttocks Mid Stage 1 -  Intact skin with non-blanchable redness of a localized area usually over a bony prominence. (Active)  05/27/21 0125  Location: Buttocks  Location Orientation: Mid  Staging: Stage 1 -  Intact skin with non-blanchable redness of a localized area usually over a bony prominence.  Wound Description (Comments):  Present on Admission: Yes      Consultants:  None   Procedures:  See below  Antimicrobials:  Anti-infectives (From admission, onward)    Start     Dose/Rate Route Frequency Ordered Stop   05/27/21 1000  remdesivir 100 mg in sodium chloride 0.9 % 100 mL IVPB       See Hyperspace for full Linked Orders Report.   100 mg 200 mL/hr over 30 Minutes Intravenous Daily 05/26/21 2003 05/31/21 0959   05/26/21 2100  remdesivir 100 mg in sodium chloride 0.9 % 100 mL IVPB       See Hyperspace for full Linked Orders Report.   100 mg 200 mL/hr over 30 Minutes Intravenous Every 1 hr x 2 05/26/21 2003 05/26/21 2249       Subjective: Patient seen and evaluated today with no new acute complaints or concerns aside from R shoulder pain. No acute concerns or events noted overnight.  Objective: Vitals:   05/27/21 1944 05/27/21 2249 05/28/21 0707 05/28/21 0729  BP:  (!) 98/59 117/62   Pulse:  94 91   Resp:  20 18   Temp:  98.2 F (36.8 C) 98.6 F (37 C)   TempSrc:  Oral Oral   SpO2: 98% 98% 100% 100%  Weight:   57.7 kg   Height:        Intake/Output Summary (Last 24 hours) at 05/28/2021 0904 Last data filed at 05/28/2021 0400 Gross per 24 hour  Intake 2510 ml  Output 550 ml  Net 1960 ml   Filed Weights   05/26/21 1507 05/28/21 0707  Weight: 56.7 kg 57.7  kg    Examination:  General exam: Appears calm and comfortable, elderly female; frail Respiratory system: Clear to auscultation. Respiratory effort normal. 2L [place]. Cardiovascular system: S1 & S2 heard, RRR.  Gastrointestinal system: Abdomen is soft Central nervous system: Alert and awake Extremities: No edema Skin: No significant lesions noted Psychiatry: Flat affect.    Data Reviewed: I have personally reviewed following labs and imaging studies  CBC: Recent Labs  Lab 05/26/21 1628 05/27/21 0600 05/28/21 0549  WBC 8.0 10.8* 14.2*  NEUTROABS  --  9.1* 12.1*  HGB 13.9 13.9 13.8  HCT 42.3 42.7 43.0  MCV 95.1 94.5 93.7  PLT 86* 100* 854*   Basic Metabolic Panel: Recent Labs  Lab 05/26/21 1628 05/26/21 2037 05/27/21 0600 05/28/21 0549  NA 138 138 139 136  K 3.7 3.4* 3.4* 3.7  CL 93* 94* 91* 91*  CO2 34* 35* 36* 35*  GLUCOSE 133* 140* 127* 113*  BUN 15 14 15  28*  CREATININE 0.60 0.69 0.79 0.69  CALCIUM 9.0 8.6* 8.8* 9.1  MG  --  1.7 1.8 1.8  PHOS  --   --  3.7 2.0*   GFR: Estimated Creatinine Clearance: 31.8 mL/min (by C-G formula based on SCr of 0.69 mg/dL). Liver Function Tests: Recent Labs  Lab 05/26/21 2037 05/27/21 0600 05/28/21 0549  AST 27 25 22   ALT 16 15 14   ALKPHOS 54 52 51  BILITOT 1.5* 1.2 1.4*  PROT 6.7 6.6 6.2*  ALBUMIN 3.8 3.6 3.4*   No results for input(s): LIPASE, AMYLASE in the last 168 hours. No results for input(s): AMMONIA in the last 168 hours. Coagulation Profile: No results for input(s): INR, PROTIME in the last 168 hours. Cardiac Enzymes: No results for input(s): CKTOTAL, CKMB, CKMBINDEX, TROPONINI in the last 168 hours. BNP (last 3 results) No results for input(s): PROBNP in the last 8760 hours. HbA1C: No results for input(s): HGBA1C in the last 72 hours. CBG: No results for input(s): GLUCAP in the last 168 hours. Lipid Profile: Recent Labs    05/26/21 2037  TRIG 37   Thyroid Function Tests: No results for  input(s): TSH, T4TOTAL, FREET4, T3FREE, THYROIDAB in the last 72 hours. Anemia Panel: Recent Labs    05/27/21 0600 05/28/21 0549  FERRITIN 275 290   Sepsis Labs: Recent Labs  Lab 05/26/21 1834 05/26/21 2037  PROCALCITON  --  <0.10  LATICACIDVEN 1.3 1.2    Recent Results (from the past 240 hour(s))  Resp Panel by RT-PCR (Flu A&B, Covid) Nasopharyngeal Swab     Status: Abnormal   Collection Time: 05/26/21  3:53 PM   Specimen: Nasopharyngeal Swab; Nasopharyngeal(NP) swabs in vial transport medium  Result Value Ref Range Status   SARS Coronavirus 2 by RT PCR POSITIVE (A) NEGATIVE Final    Comment: RESULT CALLED TO, READ  BACK BY AND VERIFIED WITH: LONG,L @ 1859 ON 05/26/21 BY JUW (NOTE) SARS-CoV-2 target nucleic acids are DETECTED.  The SARS-CoV-2 RNA is generally detectable in upper respiratory specimens during the acute phase of infection. Positive results are indicative of the presence of the identified virus, but do not rule out bacterial infection or co-infection with other pathogens not detected by the test. Clinical correlation with patient history and other diagnostic information is necessary to determine patient infection status. The expected result is Negative.  Fact Sheet for Patients: EntrepreneurPulse.com.au  Fact Sheet for Healthcare Providers: IncredibleEmployment.be  This test is not yet approved or cleared by the [place] FDA and  has been authorized for detection and/or diagnosis of SARS-CoV-2 by FDA under an Emergency Use Authorization (EUA).  This EUA will remain in effect (meaning this test can be u sed) for the duration of  the COVID-19 declaration under Section 564(b)(1) of the Act, 21 U.S.C. section 360bbb-3(b)(1), unless the authorization is terminated or revoked sooner.     Influenza A by PCR NEGATIVE NEGATIVE Final   Influenza B by PCR NEGATIVE NEGATIVE Final    Comment: (NOTE) The Xpert Xpress  SARS-CoV-2/FLU/RSV plus assay is intended as an aid in the diagnosis of influenza from Nasopharyngeal swab specimens and should not be used as a sole basis for treatment. Nasal washings and aspirates are unacceptable for Xpert Xpress SARS-CoV-2/FLU/RSV testing.  Fact Sheet for Patients: EntrepreneurPulse.com.au  Fact Sheet for Healthcare Providers: IncredibleEmployment.be  This test is not yet approved or cleared by the [place] FDA and has been authorized for detection and/or diagnosis of SARS-CoV-2 by FDA under an Emergency Use Authorization (EUA). This EUA will remain in effect (meaning this test can be used) for the duration of the COVID-19 declaration under Section 564(b)(1) of the Act, 21 U.S.C. section 360bbb-3(b)(1), unless the authorization is terminated or revoked.  Performed at [facility], [address], [place], [place] [address]   Blood Culture (routine x 2)     Status: None (Preliminary result)   Collection Time: 05/26/21  8:32 PM   Specimen: BLOOD RIGHT HAND  Result Value Ref Range Status   Specimen Description BLOOD RIGHT HAND  Final   Special Requests   Final    BOTTLES DRAWN AEROBIC ONLY Blood Culture adequate volume   Culture   Final    NO GROWTH < 12 HOURS Performed at [facility], [address], [place], [place] [address]    Report Status PENDING  Incomplete  Blood Culture (routine x 2)     Status: None (Preliminary result)   Collection Time: 05/26/21  8:37 PM   Specimen: Left Antecubital; Blood  Result Value Ref Range Status   Specimen Description LEFT ANTECUBITAL  Final   Special Requests   Final    BOTTLES DRAWN AEROBIC AND ANAEROBIC Blood Culture adequate volume   Culture   Final    NO GROWTH < 12 HOURS Performed at [facility], [address] [place], [place] [address]    Report Status PENDING  Incomplete         Radiology Studies: DG Chest Port 1 View  Result Date: 05/26/2021 CLINICAL DATA:   Shortness of breath cough EXAM: PORTABLE CHEST 1 VIEW COMPARISON:  12/04/2020, 06/10/2020 FINDINGS: Trace right-sided pleural effusion. Chronic pleural calcification and scarring at left lung base. Cardiomegaly with aortic atherosclerosis. No acute airspace disease IMPRESSION: 1. Chronic calcified pleural plaques and scarring on the left 2. Cardiomegaly with probable trace right pleural effusion Electronically Signed  By: Donavan Foil M.D.   On: 05/26/2021 16:13   ECHOCARDIOGRAM LIMITED  Result Date: 05/27/2021    ECHOCARDIOGRAM LIMITED REPORT   Patient Name:   CHONDRA BOYDE Date of Exam: 05/27/2021 Medical Rec #:  [id number]      Height:       62.0 in Accession #:    [id number]     Weight:       125.0 lb Date of Birth:  11-07-1921      BSA:          1.566 m Patient Age:    57 years       BP:           96/59 mmHg Patient Gender: F              HR:           70 bpm. Exam Location:  [facility] Procedure: Cardiac Doppler, Limited Echo and Limited Color Doppler Indications:    I50.33 Acute on chronic diastolic (congestive) heart failure  History:        Patient has prior history of Echocardiogram examinations, most                 recent 04/16/2021. Stroke and TIA, Arrythmias:Atrial Fibrillation;                 Risk Factors:Hypertension. COVID-19.  Sonographer:    Tiffany Dance Referring Phys: [id number] OLADAPO ADEFESO IMPRESSIONS  1. Left ventricular ejection fraction, by estimation, is 55 to 60%. The left ventricle has normal function. Left ventricular diastolic function could not be evaluated. There is the interventricular septum is flattened in systole and diastole, consistent  with right ventricular pressure and volume overload.  2. Right ventricular systolic function is mildly reduced. The right ventricular size is severely enlarged. There is mildly elevated pulmonary artery systolic pressure. The estimated right ventricular systolic pressure is 71.2 mmHg.  3. Left atrial size was severely dilated.  4. Right  atrial size was severely dilated.  5. A small pericardial effusion is present. The pericardial effusion is circumferential.  6. The mitral valve is grossly normal. Mild mitral valve regurgitation. No evidence of mitral stenosis.  7. The tricuspid valve is abnormal. Tricuspid valve regurgitation is moderate to severe.  8. The aortic valve is tricuspid. Aortic valve regurgitation is not visualized. No aortic stenosis is present.  9. The inferior vena cava is dilated in size with >50% respiratory variability, suggesting right atrial pressure of 8 mmHg. Comparison(s): No significant change from prior study. FINDINGS  Left Ventricle: Left ventricular ejection fraction, by estimation, is 55 to 60%. The left ventricle has normal function. The left ventricular internal cavity size was normal in size. There is no left ventricular hypertrophy. The interventricular septum is flattened in systole and diastole, consistent with right ventricular pressure and volume overload. Left ventricular diastolic function could not be evaluated. Left ventricular diastolic function could not be evaluated due to atrial fibrillation. Right Ventricle: The right ventricular size is severely enlarged. No increase in right ventricular wall thickness. Right ventricular systolic function is mildly reduced. There is mildly elevated pulmonary artery systolic pressure. The tricuspid regurgitant velocity is 2.88 m/s, and with an assumed right atrial pressure of 8 mmHg, the estimated right ventricular systolic pressure is 45.8 mmHg. Left Atrium: Left atrial size was severely dilated. Right Atrium: Right atrial size was severely dilated. Pericardium: A small pericardial effusion is present. The pericardial effusion is circumferential. Mitral Valve: The mitral valve is  grossly normal. Mild mitral valve regurgitation. No evidence of mitral valve stenosis. Tricuspid Valve: The tricuspid valve is abnormal. Tricuspid valve regurgitation is moderate to severe.  No evidence of tricuspid stenosis. Aortic Valve: The aortic valve is tricuspid. Aortic valve regurgitation is not visualized. No aortic stenosis is present. Pulmonic Valve: The pulmonic valve was grossly normal. Pulmonic valve regurgitation is not visualized. No evidence of pulmonic stenosis. Aorta: The aortic root and ascending aorta are structurally normal, with no evidence of dilitation. Venous: The inferior vena cava is dilated in size with greater than 50% respiratory variability, suggesting right atrial pressure of 8 mmHg. LEFT VENTRICLE PLAX 2D LVIDd:         3.76 cm LVIDs:         2.99 cm LV PW:         0.98 cm LV IVS:        0.92 cm LVOT diam:     1.80 cm LV SV:         33 LV SV Index:   21 LVOT Area:     2.54 cm  RIGHT VENTRICLE RV Basal diam:  4.34 cm RV Mid diam:    3.34 cm TAPSE (M-mode): 1.5 cm LEFT ATRIUM           Index       RIGHT ATRIUM           Index LA diam:      3.30 cm 2.11 cm/m  RA Area:     20.10 cm LA Vol (A4C): 93.0 ml 59.40 ml/m RA Volume:   60.60 ml  38.71 ml/m  AORTIC VALVE LVOT Vmax:   74.30 cm/s LVOT Vmean:  50.550 cm/s LVOT VTI:    0.132 m  AORTA Ao Root diam: 2.90 cm Ao Asc diam:  2.90 cm MITRAL VALVE                TRICUSPID VALVE MV Area (PHT): 5.16 cm     TR Peak grad:   33.2 mmHg MV Decel Time: 147 msec     TR Vmax:        288.00 cm/s MV E velocity: 122.50 cm/s                             SHUNTS                             Systemic VTI:  0.13 m                             Systemic Diam: 1.80 cm Eleonore Chiquito MD Electronically signed by Eleonore Chiquito MD Signature Date/Time: 05/27/2021/2:14:01 PM    Final         Scheduled Meds:  albuterol  2 puff Inhalation Q6H   vitamin C  500 mg Oral Daily   methylPREDNISolone (SOLU-MEDROL) injection  40 mg Intravenous Q12H   metoprolol succinate  25 mg Oral Daily   pantoprazole (PROTONIX) IV  40 mg Intravenous QHS   saccharomyces boulardii  250 mg Oral BID   zinc sulfate  220 mg Oral Daily   Continuous Infusions:   remdesivir 100 mg in NS 100 mL Stopped (05/27/21 1257)     LOS: 1 day    Time spent: 35 minutes    Madylyn Insco Darleen Crocker, DO [facility]  If 7PM-7AM, please contact night-coverage  [email or web address] 05/28/2021, 9:04 AM

## 2021-05-28 NOTE — Plan of Care (Signed)

## 2021-05-29 LAB — COMPREHENSIVE METABOLIC PANEL
ALT: 14 U/L (ref 0–44)
AST: 21 U/L (ref 15–41)
Albumin: 3.2 g/dL — ABNORMAL LOW (ref 3.5–5.0)
Alkaline Phosphatase: 48 U/L (ref 38–126)
Anion gap: 10 (ref 5–15)
BUN: 31 mg/dL — ABNORMAL HIGH (ref 8–23)
CO2: 34 mmol/L — ABNORMAL HIGH (ref 22–32)
Calcium: 8.7 mg/dL — ABNORMAL LOW (ref 8.9–10.3)
Chloride: 92 mmol/L — ABNORMAL LOW (ref 98–111)
Creatinine, Ser: 0.61 mg/dL (ref 0.44–1.00)
GFR, Estimated: >60 mL/min (ref >60)
Glucose, Bld: 148 mg/dL — ABNORMAL HIGH (ref 70–99)
Potassium: 4 mmol/L (ref 3.5–5.1)
Sodium: 136 mmol/L (ref 135–145)
Total Bilirubin: 0.9 mg/dL (ref 0.3–1.2)
Total Protein: 6 g/dL — ABNORMAL LOW (ref 6.5–8.1)

## 2021-05-29 LAB — CBC WITH DIFFERENTIAL/PLATELET
Abs Immature Granulocytes: 0.09 10*3/uL — ABNORMAL HIGH (ref 0.00–0.07)
Basophils Absolute: 0 10*3/uL (ref 0.0–0.1)
Basophils Relative: 0 %
Eosinophils Absolute: 0 10*3/uL (ref 0.0–0.5)
Eosinophils Relative: 0 %
HCT: 42.1 % (ref 36.0–46.0)
Hemoglobin: 13.9 g/dL (ref 12.0–15.0)
Immature Granulocytes: 1 %
Lymphocytes Relative: 7 %
Lymphs Abs: 0.6 10*3/uL — ABNORMAL LOW (ref 0.7–4.0)
MCH: 30.9 pg (ref 26.0–34.0)
MCHC: 33 g/dL (ref 30.0–36.0)
MCV: 93.6 fL (ref 80.0–100.0)
Monocytes Absolute: 0.2 10*3/uL (ref 0.1–1.0)
Monocytes Relative: 2 %
Neutro Abs: 8.6 10*3/uL — ABNORMAL HIGH (ref 1.7–7.7)
Neutrophils Relative %: 90 %
Platelets: 142 10*3/uL — ABNORMAL LOW (ref 150–400)
RBC: 4.5 MIL/uL (ref 3.87–5.11)
RDW: 13.4 % (ref 11.5–15.5)
WBC: 9.5 10*3/uL (ref 4.0–10.5)
nRBC: 0 % (ref 0.0–0.2)

## 2021-05-29 LAB — PHOSPHORUS: Phosphorus: 2.9 mg/dL (ref 2.5–4.6)

## 2021-05-29 LAB — D-DIMER, QUANTITATIVE: D-Dimer, Quant: 0.8 ug/mL-FEU — ABNORMAL HIGH (ref 0.00–0.50)

## 2021-05-29 LAB — FERRITIN: Ferritin: 313 ng/mL — ABNORMAL HIGH (ref 11–307)

## 2021-05-29 LAB — MAGNESIUM: Magnesium: 2.1 mg/dL (ref 1.7–2.4)

## 2021-05-29 LAB — C-REACTIVE PROTEIN: CRP: 10.8 mg/dL — ABNORMAL HIGH (ref <1.0)

## 2021-05-29 NOTE — Progress Notes (Signed)
PROGRESS NOTE    Kathryn Marquez  DUK:[id number] DOB: [birth year] DOA: 05/26/2021 PCP: Redmond School, MD   Brief Narrative:   Kathryn Marquez is a 87 y.o. female with medical history significant for chronic respiratory failure with hypoxia on supplemental oxygen brain CT at 2 PM, chronic atrial fibrillation on Xarelto, hypertension, thrombocytopenia and prior CVA who presents to the emergency department accompanied by daughter due to worsening shortness of breath.  Patient was admitted with symptomatic COVID-19 virus infection.  She is not significantly hypoxemic from her usual baseline, but she is high risk and has elevated inflammatory markers. Plan for DC in am after completion of Remdesivir course.  Assessment & Plan:   Principal Problem:   COVID-19 virus infection Active Problems:   Hypertension   Atrial fibrillation, chronic (HCC)   Elevated brain natriuretic peptide (BNP) level   Prolonged QT interval   Thrombocytopenia (HCC)   Chronic respiratory failure with hypoxia ([place])   COVID-19   COVID-19 virus infection -Clinically looks well, but is high risk -Inflammatory markers are starting to level off -Patient noted to have chronic hypoxemia on 2 L nasal cannula at home -Continue IV remdesivir for one more day to complete  -Continue supportive care as ordered -Follow-up a.m. inflammatory markers -Breathing treatments ordered   Elevated BNP -Does not appear to have signs of CHF at this time -With soft blood pressure readings, hold Lasix until DC -Limited 2D echocardiogram with LVEF 55-60%   Chronic atrial fibrillation -Continue home Toprol -Xarelto resumed 7/18   Chronic thrombocytopenia-stable and improving -Continue monitor repeat CBC   Essential hypertension -Continue Toprol and monitor soft blood pressure readings, Lasix currently held     DVT prophylaxis: Xarelto Code Status: DNR Family Communication: Daughter on phone 7/19 Disposition Plan:  Status is:  Inpatient   Remains inpatient appropriate because:IV treatments appropriate due to intensity of illness or inability to take PO   Dispo: The patient is from: Home              Anticipated d/c is to: Home              Patient currently is not medically stable to d/c.             Difficult to place patient No         Skin Assessment:   I have examined the patient's skin and I agree with the wound assessment as performed by the wound care RN as outlined below:   Pressure Injury 05/27/21 Buttocks Mid Stage 1 -  Intact skin with non-blanchable redness of a localized area usually over a bony prominence. (Active)  05/27/21 0125  Location: Buttocks  Location Orientation: Mid  Staging: Stage 1 -  Intact skin with non-blanchable redness of a localized area usually over a bony prominence.  Wound Description (Comments):  Present on Admission: Yes      Consultants:  None   Procedures:  See below  Antimicrobials:  Anti-infectives (From admission, onward)    Start     Dose/Rate Route Frequency Ordered Stop   05/27/21 1000  remdesivir 100 mg in sodium chloride 0.9 % 100 mL IVPB       See Hyperspace for full Linked Orders Report.   100 mg 200 mL/hr over 30 Minutes Intravenous Daily 05/26/21 2003 05/31/21 0959   05/26/21 2100  remdesivir 100 mg in sodium chloride 0.9 % 100 mL IVPB       See Hyperspace for full Linked Orders Report.  100 mg 200 mL/hr over 30 Minutes Intravenous Every 1 hr x 2 05/26/21 2003 05/26/21 2249       Subjective: Patient seen and evaluated today with no new acute complaints or concerns. No acute concerns or events noted overnight.  Objective: Vitals:   05/28/21 1447 05/28/21 2013 05/29/21 0337 05/29/21 0500  BP: 119/60 111/67 132/76   Pulse: 75 66 73   Resp:  18 18   Temp: 98 F (36.7 C) 98 F (36.7 C) 98.1 F (36.7 C)   TempSrc: Oral     SpO2: 98% 99% 100%   Weight:    57.8 kg  Height:        Intake/Output Summary (Last 24 hours) at 05/29/2021  1216 Last data filed at 05/29/2021 0500 Gross per 24 hour  Intake 240 ml  Output 800 ml  Net -560 ml   Filed Weights   05/26/21 1507 05/28/21 0707 05/29/21 0500  Weight: 56.7 kg 57.7 kg 57.8 kg    Examination:  General exam: Appears calm and comfortable, elderly/pleasant Respiratory system: Clear to auscultation. Respiratory effort normal. Currently on 2L [place] Cardiovascular system: S1 & S2 heard, RRR.  Gastrointestinal system: Abdomen is soft Central nervous system: Alert and awake Extremities: No edema Skin: No significant lesions noted Psychiatry: Flat affect.    Data Reviewed: I have personally reviewed following labs and imaging studies  CBC: Recent Labs  Lab 05/26/21 1628 05/27/21 0600 05/28/21 0549 05/29/21 0356  WBC 8.0 10.8* 14.2* 9.5  NEUTROABS  --  9.1* 12.1* 8.6*  HGB 13.9 13.9 13.8 13.9  HCT 42.3 42.7 43.0 42.1  MCV 95.1 94.5 93.7 93.6  PLT 86* 100* 107* 295*   Basic Metabolic Panel: Recent Labs  Lab 05/26/21 1628 05/26/21 2037 05/27/21 0600 05/28/21 0549 05/29/21 0356  NA 138 138 139 136 136  K 3.7 3.4* 3.4* 3.7 4.0  CL 93* 94* 91* 91* 92*  CO2 34* 35* 36* 35* 34*  GLUCOSE 133* 140* 127* 113* 148*  BUN 15 14 15  28* 31*  CREATININE 0.60 0.69 0.79 0.69 0.61  CALCIUM 9.0 8.6* 8.8* 9.1 8.7*  MG  --  1.7 1.8 1.8 2.1  PHOS  --   --  3.7 2.0* 2.9   GFR: Estimated Creatinine Clearance: 31.8 mL/min (by C-G formula based on SCr of 0.61 mg/dL). Liver Function Tests: Recent Labs  Lab 05/26/21 2037 05/27/21 0600 05/28/21 0549 05/29/21 0356  AST 27 25 22 21   ALT 16 15 14 14   ALKPHOS 54 52 51 48  BILITOT 1.5* 1.2 1.4* 0.9  PROT 6.7 6.6 6.2* 6.0*  ALBUMIN 3.8 3.6 3.4* 3.2*   No results for input(s): LIPASE, AMYLASE in the last 168 hours. No results for input(s): AMMONIA in the last 168 hours. Coagulation Profile: No results for input(s): INR, PROTIME in the last 168 hours. Cardiac Enzymes: No results for input(s): CKTOTAL, CKMB, CKMBINDEX,  TROPONINI in the last 168 hours. BNP (last 3 results) No results for input(s): PROBNP in the last 8760 hours. HbA1C: No results for input(s): HGBA1C in the last 72 hours. CBG: No results for input(s): GLUCAP in the last 168 hours. Lipid Profile: Recent Labs    05/26/21 2037  TRIG 37   Thyroid Function Tests: No results for input(s): TSH, T4TOTAL, FREET4, T3FREE, THYROIDAB in the last 72 hours. Anemia Panel: Recent Labs    05/28/21 0549 05/29/21 0356  FERRITIN 290 313*   Sepsis Labs: Recent Labs  Lab 05/26/21 1834 05/26/21 2037  PROCALCITON  --  <  0.10  LATICACIDVEN 1.3 1.2    Recent Results (from the past 240 hour(s))  Resp Panel by RT-PCR (Flu A&B, Covid) Nasopharyngeal Swab     Status: Abnormal   Collection Time: 05/26/21  3:53 PM   Specimen: Nasopharyngeal Swab; Nasopharyngeal(NP) swabs in vial transport medium  Result Value Ref Range Status   SARS Coronavirus 2 by RT PCR POSITIVE (A) NEGATIVE Final    Comment: RESULT CALLED TO, READ BACK BY AND VERIFIED WITH: LONG,L @ 1859 ON 05/26/21 BY JUW (NOTE) SARS-CoV-2 target nucleic acids are DETECTED.  The SARS-CoV-2 RNA is generally detectable in upper respiratory specimens during the acute phase of infection. Positive results are indicative of the presence of the identified virus, but do not rule out bacterial infection or co-infection with other pathogens not detected by the test. Clinical correlation with patient history and other diagnostic information is necessary to determine patient infection status. The expected result is Negative.  Fact Sheet for Patients: EntrepreneurPulse.com.au  Fact Sheet for Healthcare Providers: IncredibleEmployment.be  This test is not yet approved or cleared by the [place] FDA and  has been authorized for detection and/or diagnosis of SARS-CoV-2 by FDA under an Emergency Use Authorization (EUA).  This EUA will remain in effect (meaning  this test can be u sed) for the duration of  the COVID-19 declaration under Section 564(b)(1) of the Act, 21 U.S.C. section 360bbb-3(b)(1), unless the authorization is terminated or revoked sooner.     Influenza A by PCR NEGATIVE NEGATIVE Final   Influenza B by PCR NEGATIVE NEGATIVE Final    Comment: (NOTE) The Xpert Xpress SARS-CoV-2/FLU/RSV plus assay is intended as an aid in the diagnosis of influenza from Nasopharyngeal swab specimens and should not be used as a sole basis for treatment. Nasal washings and aspirates are unacceptable for Xpert Xpress SARS-CoV-2/FLU/RSV testing.  Fact Sheet for Patients: EntrepreneurPulse.com.au  Fact Sheet for Healthcare Providers: IncredibleEmployment.be  This test is not yet approved or cleared by the [place] FDA and has been authorized for detection and/or diagnosis of SARS-CoV-2 by FDA under an Emergency Use Authorization (EUA). This EUA will remain in effect (meaning this test can be used) for the duration of the COVID-19 declaration under Section 564(b)(1) of the Act, 21 U.S.C. section 360bbb-3(b)(1), unless the authorization is terminated or revoked.  Performed at [facility], [address], [place], [place] [address]   Blood Culture (routine x 2)     Status: None (Preliminary result)   Collection Time: 05/26/21  8:32 PM   Specimen: BLOOD RIGHT HAND  Result Value Ref Range Status   Specimen Description BLOOD RIGHT HAND  Final   Special Requests   Final    BOTTLES DRAWN AEROBIC ONLY Blood Culture adequate volume   Culture   Final    NO GROWTH 2 DAYS Performed at [facility], [address] [place], [place] [address]    Report Status PENDING  Incomplete  Blood Culture (routine x 2)     Status: None (Preliminary result)   Collection Time: 05/26/21  8:37 PM   Specimen: Left Antecubital; Blood  Result Value Ref Range Status   Specimen Description LEFT ANTECUBITAL  Final   Special Requests    Final    BOTTLES DRAWN AEROBIC AND ANAEROBIC Blood Culture adequate volume   Culture   Final    NO GROWTH 2 DAYS Performed at [facility], [address], [place], [place] [address]    Report Status PENDING  Incomplete  Radiology Studies: ECHOCARDIOGRAM LIMITED  Result Date: 05/27/2021    ECHOCARDIOGRAM LIMITED REPORT   Patient Name:   Kathryn Marquez Date of Exam: 05/27/2021 Medical Rec #:  [id number]      Height:       62.0 in Accession #:    [id number]     Weight:       125.0 lb Date of Birth:  Oct 05, 1921      BSA:          1.566 m Patient Age:    80 years       BP:           96/59 mmHg Patient Gender: F              HR:           70 bpm. Exam Location:  [facility] Procedure: Cardiac Doppler, Limited Echo and Limited Color Doppler Indications:    I50.33 Acute on chronic diastolic (congestive) heart failure  History:        Patient has prior history of Echocardiogram examinations, most                 recent 04/16/2021. Stroke and TIA, Arrythmias:Atrial Fibrillation;                 Risk Factors:Hypertension. COVID-19.  Sonographer:    Tiffany Dance Referring Phys: [id number] OLADAPO ADEFESO IMPRESSIONS  1. Left ventricular ejection fraction, by estimation, is 55 to 60%. The left ventricle has normal function. Left ventricular diastolic function could not be evaluated. There is the interventricular septum is flattened in systole and diastole, consistent  with right ventricular pressure and volume overload.  2. Right ventricular systolic function is mildly reduced. The right ventricular size is severely enlarged. There is mildly elevated pulmonary artery systolic pressure. The estimated right ventricular systolic pressure is 02.7 mmHg.  3. Left atrial size was severely dilated.  4. Right atrial size was severely dilated.  5. A small pericardial effusion is present. The pericardial effusion is circumferential.  6. The mitral valve is grossly normal. Mild mitral valve regurgitation. No evidence of  mitral stenosis.  7. The tricuspid valve is abnormal. Tricuspid valve regurgitation is moderate to severe.  8. The aortic valve is tricuspid. Aortic valve regurgitation is not visualized. No aortic stenosis is present.  9. The inferior vena cava is dilated in size with >50% respiratory variability, suggesting right atrial pressure of 8 mmHg. Comparison(s): No significant change from prior study. FINDINGS  Left Ventricle: Left ventricular ejection fraction, by estimation, is 55 to 60%. The left ventricle has normal function. The left ventricular internal cavity size was normal in size. There is no left ventricular hypertrophy. The interventricular septum is flattened in systole and diastole, consistent with right ventricular pressure and volume overload. Left ventricular diastolic function could not be evaluated. Left ventricular diastolic function could not be evaluated due to atrial fibrillation. Right Ventricle: The right ventricular size is severely enlarged. No increase in right ventricular wall thickness. Right ventricular systolic function is mildly reduced. There is mildly elevated pulmonary artery systolic pressure. The tricuspid regurgitant velocity is 2.88 m/s, and with an assumed right atrial pressure of 8 mmHg, the estimated right ventricular systolic pressure is 74.1 mmHg. Left Atrium: Left atrial size was severely dilated. Right Atrium: Right atrial size was severely dilated. Pericardium: A small pericardial effusion is present. The pericardial effusion is circumferential. Mitral Valve: The mitral valve is grossly normal. Mild mitral valve regurgitation. No evidence of mitral  valve stenosis. Tricuspid Valve: The tricuspid valve is abnormal. Tricuspid valve regurgitation is moderate to severe. No evidence of tricuspid stenosis. Aortic Valve: The aortic valve is tricuspid. Aortic valve regurgitation is not visualized. No aortic stenosis is present. Pulmonic Valve: The pulmonic valve was grossly normal.  Pulmonic valve regurgitation is not visualized. No evidence of pulmonic stenosis. Aorta: The aortic root and ascending aorta are structurally normal, with no evidence of dilitation. Venous: The inferior vena cava is dilated in size with greater than 50% respiratory variability, suggesting right atrial pressure of 8 mmHg. LEFT VENTRICLE PLAX 2D LVIDd:         3.76 cm LVIDs:         2.99 cm LV PW:         0.98 cm LV IVS:        0.92 cm LVOT diam:     1.80 cm LV SV:         33 LV SV Index:   21 LVOT Area:     2.54 cm  RIGHT VENTRICLE RV Basal diam:  4.34 cm RV Mid diam:    3.34 cm TAPSE (M-mode): 1.5 cm LEFT ATRIUM           Index       RIGHT ATRIUM           Index LA diam:      3.30 cm 2.11 cm/m  RA Area:     20.10 cm LA Vol (A4C): 93.0 ml 59.40 ml/m RA Volume:   60.60 ml  38.71 ml/m  AORTIC VALVE LVOT Vmax:   74.30 cm/s LVOT Vmean:  50.550 cm/s LVOT VTI:    0.132 m  AORTA Ao Root diam: 2.90 cm Ao Asc diam:  2.90 cm MITRAL VALVE                TRICUSPID VALVE MV Area (PHT): 5.16 cm     TR Peak grad:   33.2 mmHg MV Decel Time: 147 msec     TR Vmax:        288.00 cm/s MV E velocity: 122.50 cm/s                             SHUNTS                             Systemic VTI:  0.13 m                             Systemic Diam: 1.80 cm Eleonore Chiquito MD Electronically signed by Eleonore Chiquito MD Signature Date/Time: 05/27/2021/2:14:01 PM    Final         Scheduled Meds:  vitamin C  500 mg Oral Daily   diclofenac Sodium  2 g Topical QID   methylPREDNISolone (SOLU-MEDROL) injection  40 mg Intravenous Q12H   metoprolol succinate  25 mg Oral Daily   pantoprazole (PROTONIX) IV  40 mg Intravenous QHS   Rivaroxaban  15 mg Oral q AM   saccharomyces boulardii  250 mg Oral BID   zinc sulfate  220 mg Oral Daily   Continuous Infusions:  remdesivir 100 mg in NS 100 mL 100 mg (05/29/21 0948)     LOS: 2 days    Time spent: 35 minutes    Artisha Capri Darleen Crocker, DO [facility]  If 7PM-7AM, please contact  night-coverage [email or web address] 05/29/2021, 12:16 PM

## 2021-05-29 NOTE — Plan of Care (Signed)

## 2021-05-30 DIAGNOSIS — J9611 Chronic respiratory failure with hypoxia: Secondary | ICD-10-CM

## 2021-05-30 DIAGNOSIS — R7989 Other specified abnormal findings of blood chemistry: Secondary | ICD-10-CM

## 2021-05-30 DIAGNOSIS — U071 COVID-19: Principal | ICD-10-CM

## 2021-05-30 DIAGNOSIS — L8990 Pressure ulcer of unspecified site, unspecified stage: Secondary | ICD-10-CM | POA: Insufficient documentation

## 2021-05-30 DIAGNOSIS — I482 Chronic atrial fibrillation, unspecified: Secondary | ICD-10-CM

## 2021-05-30 LAB — CBC WITH DIFFERENTIAL/PLATELET
Abs Immature Granulocytes: 0.06 10*3/uL (ref 0.00–0.07)
Basophils Absolute: 0 10*3/uL (ref 0.0–0.1)
Basophils Relative: 0 %
Eosinophils Absolute: 0 10*3/uL (ref 0.0–0.5)
Eosinophils Relative: 0 %
HCT: 41.5 % (ref 36.0–46.0)
Hemoglobin: 13.6 g/dL (ref 12.0–15.0)
Immature Granulocytes: 1 %
Lymphocytes Relative: 8 %
Lymphs Abs: 0.7 10*3/uL (ref 0.7–4.0)
MCH: 30.2 pg (ref 26.0–34.0)
MCHC: 32.8 g/dL (ref 30.0–36.0)
MCV: 92.2 fL (ref 80.0–100.0)
Monocytes Absolute: 0.4 10*3/uL (ref 0.1–1.0)
Monocytes Relative: 4 %
Neutro Abs: 8.1 10*3/uL — ABNORMAL HIGH (ref 1.7–7.7)
Neutrophils Relative %: 87 %
Platelets: 184 10*3/uL (ref 150–400)
RBC: 4.5 MIL/uL (ref 3.87–5.11)
RDW: 13.3 % (ref 11.5–15.5)
WBC: 9.2 10*3/uL (ref 4.0–10.5)
nRBC: 0 % (ref 0.0–0.2)

## 2021-05-30 LAB — COMPREHENSIVE METABOLIC PANEL
ALT: 14 U/L (ref 0–44)
AST: 19 U/L (ref 15–41)
Albumin: 2.9 g/dL — ABNORMAL LOW (ref 3.5–5.0)
Alkaline Phosphatase: 44 U/L (ref 38–126)
Anion gap: 8 (ref 5–15)
BUN: 38 mg/dL — ABNORMAL HIGH (ref 8–23)
CO2: 34 mmol/L — ABNORMAL HIGH (ref 22–32)
Calcium: 8.5 mg/dL — ABNORMAL LOW (ref 8.9–10.3)
Chloride: 92 mmol/L — ABNORMAL LOW (ref 98–111)
Creatinine, Ser: 0.63 mg/dL (ref 0.44–1.00)
GFR, Estimated: >60 mL/min (ref >60)
Glucose, Bld: 156 mg/dL — ABNORMAL HIGH (ref 70–99)
Potassium: 3.7 mmol/L (ref 3.5–5.1)
Sodium: 134 mmol/L — ABNORMAL LOW (ref 135–145)
Total Bilirubin: 0.8 mg/dL (ref 0.3–1.2)
Total Protein: 5.4 g/dL — ABNORMAL LOW (ref 6.5–8.1)

## 2021-05-30 LAB — FERRITIN: Ferritin: 331 ng/mL — ABNORMAL HIGH (ref 11–307)

## 2021-05-30 LAB — D-DIMER, QUANTITATIVE: D-Dimer, Quant: 0.63 ug/mL-FEU — ABNORMAL HIGH (ref 0.00–0.50)

## 2021-05-30 LAB — C-REACTIVE PROTEIN: CRP: 5.1 mg/dL — ABNORMAL HIGH (ref <1.0)

## 2021-05-30 LAB — MAGNESIUM: Magnesium: 2.1 mg/dL (ref 1.7–2.4)

## 2021-05-30 LAB — PHOSPHORUS: Phosphorus: 3.2 mg/dL (ref 2.5–4.6)

## 2021-05-30 MED ORDER — OMEPRAZOLE 20 MG PO TBEC: 1.0000 | DELAYED_RELEASE_TABLET | Freq: Every day | ORAL | 0 refills | Status: DC

## 2021-05-30 MED ORDER — ZINC SULFATE 220 (50 ZN) MG PO CAPS: 220.0000 mg | ORAL_CAPSULE | Freq: Every day | ORAL | 0 refills | Status: AC

## 2021-05-30 MED ORDER — PREDNISONE 10 MG PO TABS: ORAL_TABLET | ORAL | 0 refills | Status: DC

## 2021-05-30 MED ORDER — GUAIFENESIN-DM 100-10 MG/5ML PO SYRP: 10.0000 mL | ORAL_SOLUTION | ORAL | 0 refills | Status: AC | PRN

## 2021-05-30 NOTE — Evaluation (Signed)
Physical Therapy Evaluation Patient Details Name: Kathryn Marquez MRN: [id number] DOB: April 29, 1921 Today's Date: 05/30/2021   History of Present Illness  Kathryn Marquez is a 87 y.o. female with medical history significant for chronic respiratory failure with hypoxia on supplemental oxygen brain CT at 2 PM, chronic atrial fibrillation on Xarelto, hypertension, thrombocytopenia and prior CVA who presents to the emergency department accompanied by daughter due to increasing and worsening shortness of breath which started yesterday, this was associated with nonproductive cough and chest congestion and chest pain which worsens with cough.  Patient was recently exposed to son who has had COVID within the last 10 days.  She denies fever, chills, nausea, vomiting.  Patient is hard of hearing and most of the history was obtained from daughter at bedside.   Clinical Impression  Patient functioning near baseline for functional mobility and gait demonstrating good return for bed mobility, transferring to chair/commode and ambulation in room without loss of balance.  Patient tolerated sitting up in chair after therapy - RN notified.  Plan:  patient to be discharged from hospital to day and discharged from physical therapy to care of nursing for ambulation daily as tolerated for length of stay.      Follow Up Recommendations Home health PT;Supervision for mobility/OOB;Supervision - Intermittent    Equipment Recommendations  None recommended by PT    Recommendations for Other Services       Precautions / Restrictions Precautions Precautions: Fall Restrictions Weight Bearing Restrictions: No      Mobility  Bed Mobility Overal bed mobility: Modified Independent                  Transfers Overall transfer level: Needs assistance Equipment used: Rolling walker (2 wheeled) Transfers: Sit to/from [facility] Sit to Stand: Min guard Stand pivot transfers: Min guard        General transfer comment: increased time, labored movement  Ambulation/Gait Ambulation/Gait assistance: Min guard Gait Distance (Feet): 35 Feet Assistive device: Rolling walker (2 wheeled) Gait Pattern/deviations: Decreased step length - left;Decreased step length - right;Decreased stride length Gait velocity: decreased   General Gait Details: slow labored cadence without loss of balance, increased time for making turns, limited secondary to c/o fatigue  Stairs            Wheelchair Mobility    Modified Rankin (Stroke Patients Only)       Balance Overall balance assessment: Needs assistance Sitting-balance support: Feet supported;No upper extremity supported Sitting balance-Leahy Scale: Good Sitting balance - Comments: seated at EOB   Standing balance support: During functional activity Standing balance-Leahy Scale: Fair Standing balance comment: using RW                             Pertinent Vitals/Pain Pain Assessment: No/denies pain    Home Living Family/patient expects to be discharged to:: Private residence Living Arrangements: Children Available Help at Discharge: Family;Available 24 hours/day Type of Home: House Home Access: Stairs to enter Entrance Stairs-Rails: None Entrance Stairs-Number of Steps: 3 Home Layout: One level Home Equipment: Walker - 4 wheels;Wheelchair - manual;Bedside commode Additional Comments: Patient states she usually takes sink baths sitting    Prior Function Level of Independence: Needs assistance   Gait / Transfers Assistance Needed: household ambulator using RW  ADL's / Homemaking Assistance Needed: assisted by family        Hand Dominance   Dominant Hand: Right    Extremity/Trunk Assessment  Upper Extremity Assessment Upper Extremity Assessment: Overall WFL for tasks assessed    Lower Extremity Assessment Lower Extremity Assessment: Generalized weakness    Cervical / Trunk Assessment Cervical  / Trunk Assessment: Kyphotic  Communication   Communication: No difficulties  Cognition Arousal/Alertness: Awake/alert Behavior During Therapy: WFL for tasks assessed/performed Overall Cognitive Status: Within Functional Limits for tasks assessed                                        General Comments      Exercises     Assessment/Plan    PT Assessment All further PT needs can be met in the next venue of care  PT Problem List Decreased strength;Decreased activity tolerance;Decreased balance;Decreased mobility       PT Treatment Interventions      PT Goals (Current goals can be found in the Care Plan section)  Acute Rehab PT Goals Patient Stated Goal: return home with family to assist PT Goal Formulation: With patient Time For Goal Achievement: 05/30/21 Potential to Achieve Goals: Good    Frequency     Barriers to discharge        Co-evaluation               AM-PAC PT "6 Clicks" Mobility  Outcome Measure Help needed turning from your back to your side while in a flat bed without using bedrails?: None Help needed moving from lying on your back to sitting on the side of a flat bed without using bedrails?: None Help needed moving to and from a bed to a chair (including a wheelchair)?: A Little Help needed standing up from a chair using your arms (e.g., wheelchair or bedside chair)?: A Little Help needed to walk in hospital room?: A Little Help needed climbing 3-5 steps with a railing? : A Lot 6 Click Score: 19    End of Session Equipment Utilized During Treatment: Oxygen Activity Tolerance: Patient tolerated treatment well;Patient limited by fatigue Patient left: in chair;with call bell/phone within reach Nurse Communication: Mobility status PT Visit Diagnosis: Unsteadiness on feet (R26.81);Other abnormalities of gait and mobility (R26.89);Muscle weakness (generalized) (M62.81)    Time: 1020-1055 PT Time Calculation (min) (ACUTE ONLY): 35  min   Charges:   PT Evaluation $PT Eval Moderate Complexity: 1 Mod PT Treatments $Therapeutic Activity: 23-37 mins        12:19 PM, 05/30/21 Lonell Grandchild, MPT Physical Therapist with [facility] 336 [phone] office [phone] mobile phone

## 2021-05-30 NOTE — Plan of Care (Signed)

## 2021-05-30 NOTE — Plan of Care (Signed)
  Problem: Education: Goal: Knowledge of General Education information will improve Description: Including pain rating scale, medication(s)/side effects and non-pharmacologic comfort measures Outcome: Progressing   Problem: Health Behavior/Discharge Planning: Goal: Ability to manage health-related needs will improve Outcome: Progressing   Problem: Clinical Measurements: Goal: Ability to maintain clinical measurements within normal limits will improve Outcome: Progressing Goal: Will remain free from infection Outcome: Progressing Goal: Respiratory complications will improve Outcome: Progressing Goal: Cardiovascular complication will be avoided Outcome: Progressing   Problem: Activity: Goal: Risk for activity intolerance will decrease Outcome: Progressing   Problem: Nutrition: Goal: Adequate nutrition will be maintained Outcome: Progressing   Problem: Coping: Goal: Level of anxiety will decrease Outcome: Progressing   Problem: Elimination: Goal: Will not experience complications related to bowel motility Outcome: Progressing Goal: Will not experience complications related to urinary retention Outcome: Progressing   

## 2021-05-30 NOTE — Clinical Social Work Note (Signed)
Cory with [facility] accepts referral for HHPT. [facility] is currently accepting of patient's insurance today.   Cintya Daughety, Clydene Pugh, LCSW

## 2021-05-30 NOTE — Discharge Instructions (Signed)
IMPORTANT INFORMATION: PAY CLOSE ATTENTION  ? ?PHYSICIAN DISCHARGE INSTRUCTIONS ? ?Follow with Primary care provider  Fusco, Lawrence, MD  and other consultants as instructed by your Hospitalist Physician ? ?SEEK MEDICAL CARE OR RETURN TO EMERGENCY ROOM IF SYMPTOMS COME BACK, WORSEN OR NEW PROBLEM DEVELOPS  ? ?Please note: ?You were cared for by a hospitalist during your hospital stay. Every effort will be made to forward records to your primary care provider.  You can request that your primary care provider send for your hospital records if they have not received them.  Once you are discharged, your primary care physician will handle any further medical issues. Please note that NO REFILLS for any discharge medications will be authorized once you are discharged, as it is imperative that you return to your primary care physician (or establish a relationship with a primary care physician if you do not have one) for your post hospital discharge needs so that they can reassess your need for medications and monitor your lab values. ? ?Please get a complete blood count and chemistry panel checked by your Primary MD at your next visit, and again as instructed by your Primary MD. ? ?Get Medicines reviewed and adjusted: ?Please take all your medications with you for your next visit with your Primary MD ? ?Laboratory/radiological data: ?Please request your Primary MD to go over all hospital tests and procedure/radiological results at the follow up, please ask your primary care provider to get all Hospital records sent to his/her office. ? ?In some cases, they will be blood work, cultures and biopsy results pending at the time of your discharge. Please request that your primary care provider follow up on these results. ? ?If you are diabetic, please bring your blood sugar readings with you to your follow up appointment with primary care.   ? ?Please call and make your follow up appointments as soon as possible.   ? ?Also Note  the following: ?If you experience worsening of your admission symptoms, develop shortness of breath, life threatening emergency, suicidal or homicidal thoughts you must seek medical attention immediately by calling 911 or calling your MD immediately  if symptoms less severe. ? ?You must read complete instructions/literature along with all the possible adverse reactions/side effects for all the Medicines you take and that have been prescribed to you. Take any new Medicines after you have completely understood and accpet all the possible adverse reactions/side effects.  ? ?Do not drive when taking Pain medications or sleeping medications (Benzodiazepines) ? ?Do not take more than prescribed Pain, Sleep and Anxiety Medications. It is not advisable to combine anxiety,sleep and pain medications without talking with your primary care practitioner ? ?Special Instructions: If you have smoked or chewed Tobacco  in the last 2 yrs please stop smoking, stop any regular Alcohol  and or any Recreational drug use. ? ?Wear Seat belts while driving.  Do not drive if taking any narcotic, mind altering or controlled substances or recreational drugs or alcohol.  ? ? ? ? ? ?

## 2021-05-30 NOTE — Discharge Summary (Signed)
Physician Discharge Summary  Kathryn Marquez:[id number] DOB: Jan 16, 1921 DOA: 05/26/2021  PCP: Redmond School, MD  Admit date: 05/26/2021 Discharge date: 05/30/2021  Admitted From:  Home  Disposition: Home with [facility]   Recommendations for Outpatient Follow-up:  Follow up with PCP in 2  weeks Ambulatory referral to palliative care   Home Health:  PT   Discharge Condition: STABLE   CODE STATUS: DNR  DIET:  Heart healthy   Brief Hospitalization Summary: Please see all hospital notes, images, labs for full details of the hospitalization. ADMISSION HPI:  Kathryn Marquez is a 87 y.o. female with medical history significant for chronic respiratory failure with hypoxia on supplemental oxygen brain CT at 2 PM, chronic atrial fibrillation on Xarelto, hypertension, thrombocytopenia and prior CVA who presents to the emergency department accompanied by daughter due to increasing and worsening shortness of breath which started yesterday, this was associated with nonproductive cough and chest congestion and chest pain which worsens with cough.  Patient was recently exposed to son who has had COVID within the last 10 days.  She denies fever, chills, nausea, vomiting.  Patient is hard of hearing and most of the history was obtained from daughter at bedside.   ED Course:  In the emergency department, she was tachypneic, BP was 138/77, other vital signs were within normal range, O2 sat was 96 to 100% on supplemental oxygen via [place] at 2 LPM.  Work-up in ED showed thrombocytopenia and mild hyperglycemia, BNP is 247, troponin x 2- 8 > 9.  Lactic acid 1.3.  Influenza A, B was negative.  SARS coronavirus 2 was positive.  Chest x-ray showed chronic calcified pleural plaques and scarring on the left and cardiomegaly with probable trace right pleural effusion.  Plan treatment with albuterol was provided, patient was started on IV remdesivir.  Hospitalist was asked to admit patient for further evaluation and management.     HOSPITAL COURSE BY PROBLEM  COVID-19 virus infection -Clinically looks well, but is high risk GIVEN ADVANCED AGE -Inflammatory markers are improving / trending down -Patient noted to have chronic hypoxemia on 2 L nasal cannula at home -Pt was treated with IV remdesivir for a full course of therapy   Elevated BNP -Does not appear to have signs of CHF at this time -With soft blood pressure readings, hold Lasix until DC -Limited 2D echocardiogram with LVEF 55-60%   Chronic atrial fibrillation -Continue home Toprol -Xarelto resumed 7/18   Chronic thrombocytopenia-stable and improving -Continue monitor repeat CBC   Essential hypertension -Continue Toprol and monitor soft blood pressure readings, Lasix resumed at discharge    DVT prophylaxis: Xarelto Code Status: DNR Family Communication: Daughter on phone 7/19 Disposition Plan: Home with Home health PT  Status is: Inpatient  Discharge Diagnoses:  Principal Problem:   COVID-19 virus infection Active Problems:   Hypertension   Atrial fibrillation, chronic (HCC)   Elevated brain natriuretic peptide (BNP) level   Prolonged QT interval   Thrombocytopenia (HCC)   Chronic respiratory failure with hypoxia (HCC)   COVID-19   Pressure injury of skin   Discharge Instructions:  Allergies as of 05/30/2021   No Known Allergies      Medication List     TAKE these medications    ascorbic acid 500 MG tablet Commonly known as: VITAMIN C Take 500 mg by mouth daily.   B-12 PO Take 1 tablet by mouth daily.   calcium-vitamin D 500-200 MG-UNIT tablet Commonly known as: OSCAL WITH D Take 1 tablet  by mouth daily with breakfast.   cetirizine 10 MG tablet Commonly known as: ZYRTEC Take 10 mg by mouth in the morning.   furosemide 40 MG tablet Commonly known as: LASIX Take 1 tablet (40 mg total) by mouth daily.   guaiFENesin-dextromethorphan 100-10 MG/5ML syrup Commonly known as: ROBITUSSIN DM Take 10 mLs by mouth every 4  (four) hours as needed for cough.   metoprolol succinate 25 MG 24 hr tablet Commonly known as: TOPROL-XL Take 1 tablet (25 mg total) by mouth daily. New blood pressure medicine.   Omeprazole 20 MG Tbec Take 1 tablet (20 mg total) by mouth at bedtime.   potassium chloride 10 MEQ tablet Commonly known as: KLOR-CON Take 1 tablet (10 mEq total) by mouth daily.   predniSONE 10 MG tablet Commonly known as: DELTASONE Take 3 tabs po QAM x 3 days, then 2 po QAM x 3 days, then 1 po QAM x 3 days then stop Start taking on: May 31, 2021   saccharomyces boulardii 250 MG capsule Commonly known as: FLORASTOR Take 250 mg by mouth 2 (two) times daily.   VITAMIN D3 COMPLETE PO Take 1 tablet by mouth daily.   Xarelto 15 MG Tabs tablet Generic drug: Rivaroxaban Take 15 mg by mouth in the morning.   zinc sulfate 220 (50 Zn) MG capsule Take 1 capsule (220 mg total) by mouth daily. Start taking on: May 31, 2021        Follow-up Information     Redmond School, MD. Schedule an appointment as soon as possible for a visit in 2 week(s).   Specialty: Internal Medicine Why: Hospital Follow Up Contact information: [address] [place] [address] [phone]         Satira Sark, MD .   Specialty: Cardiology Contact information: [place] [address] [phone]                No Known Allergies Allergies as of 05/30/2021   No Known Allergies      Medication List     TAKE these medications    ascorbic acid 500 MG tablet Commonly known as: VITAMIN C Take 500 mg by mouth daily.   B-12 PO Take 1 tablet by mouth daily.   calcium-vitamin D 500-200 MG-UNIT tablet Commonly known as: OSCAL WITH D Take 1 tablet by mouth daily with breakfast.   cetirizine 10 MG tablet Commonly known as: ZYRTEC Take 10 mg by mouth in the morning.   furosemide 40 MG tablet Commonly known as: LASIX Take 1 tablet (40 mg total) by mouth daily.    guaiFENesin-dextromethorphan 100-10 MG/5ML syrup Commonly known as: ROBITUSSIN DM Take 10 mLs by mouth every 4 (four) hours as needed for cough.   metoprolol succinate 25 MG 24 hr tablet Commonly known as: TOPROL-XL Take 1 tablet (25 mg total) by mouth daily. New blood pressure medicine.   Omeprazole 20 MG Tbec Take 1 tablet (20 mg total) by mouth at bedtime.   potassium chloride 10 MEQ tablet Commonly known as: KLOR-CON Take 1 tablet (10 mEq total) by mouth daily.   predniSONE 10 MG tablet Commonly known as: DELTASONE Take 3 tabs po QAM x 3 days, then 2 po QAM x 3 days, then 1 po QAM x 3 days then stop Start taking on: May 31, 2021   saccharomyces boulardii 250 MG capsule Commonly known as: FLORASTOR Take 250 mg by mouth 2 (two) times daily.   VITAMIN D3 COMPLETE PO Take 1  tablet by mouth daily.   Xarelto 15 MG Tabs tablet Generic drug: Rivaroxaban Take 15 mg by mouth in the morning.   zinc sulfate 220 (50 Zn) MG capsule Take 1 capsule (220 mg total) by mouth daily. Start taking on: May 31, 2021        Procedures/Studies: [facility] [place] 1 View  Result Date: 05/26/2021 CLINICAL DATA:  Shortness of breath cough EXAM: PORTABLE CHEST 1 VIEW COMPARISON:  12/04/2020, 06/10/2020 FINDINGS: Trace right-sided pleural effusion. Chronic pleural calcification and scarring at left lung base. Cardiomegaly with aortic atherosclerosis. No acute airspace disease IMPRESSION: 1. Chronic calcified pleural plaques and scarring on the left 2. Cardiomegaly with probable trace right pleural effusion Electronically Signed   By: Donavan Foil M.D.   On: 05/26/2021 16:13   ECHOCARDIOGRAM LIMITED  Result Date: 05/27/2021    ECHOCARDIOGRAM LIMITED REPORT   Patient Name:   LANESHA AZZARO Date of Exam: 05/27/2021 Medical Rec #:  [id number]      Height:       62.0 in Accession #:    [id number]     Weight:       125.0 lb Date of Birth:  [birth year]      BSA:          1.566 m Patient Age:    87 years        BP:           96/59 mmHg Patient Gender: F              HR:           70 bpm. Exam Location:  [facility] Procedure: Cardiac Doppler, Limited Echo and Limited Color Doppler Indications:    I50.33 Acute on chronic diastolic (congestive) heart failure  History:        Patient has prior history of Echocardiogram examinations, most                 recent 04/16/2021. Stroke and TIA, Arrythmias:Atrial Fibrillation;                 Risk Factors:Hypertension. COVID-19.  Sonographer:    Tiffany Dance Referring Phys: [id number] OLADAPO ADEFESO IMPRESSIONS  1. Left ventricular ejection fraction, by estimation, is 55 to 60%. The left ventricle has normal function. Left ventricular diastolic function could not be evaluated. There is the interventricular septum is flattened in systole and diastole, consistent  with right ventricular pressure and volume overload.  2. Right ventricular systolic function is mildly reduced. The right ventricular size is severely enlarged. There is mildly elevated pulmonary artery systolic pressure. The estimated right ventricular systolic pressure is 69.4 mmHg.  3. Left atrial size was severely dilated.  4. Right atrial size was severely dilated.  5. A small pericardial effusion is present. The pericardial effusion is circumferential.  6. The mitral valve is grossly normal. Mild mitral valve regurgitation. No evidence of mitral stenosis.  7. The tricuspid valve is abnormal. Tricuspid valve regurgitation is moderate to severe.  8. The aortic valve is tricuspid. Aortic valve regurgitation is not visualized. No aortic stenosis is present.  9. The inferior vena cava is dilated in size with >50% respiratory variability, suggesting right atrial pressure of 8 mmHg. Comparison(s): No significant change from prior study. FINDINGS  Left Ventricle: Left ventricular ejection fraction, by estimation, is 55 to 60%. The left ventricle has normal function. The left ventricular internal cavity size was normal in size.  There is no left ventricular hypertrophy. The interventricular septum  is flattened in systole and diastole, consistent with right ventricular pressure and volume overload. Left ventricular diastolic function could not be evaluated. Left ventricular diastolic function could not be evaluated due to atrial fibrillation. Right Ventricle: The right ventricular size is severely enlarged. No increase in right ventricular wall thickness. Right ventricular systolic function is mildly reduced. There is mildly elevated pulmonary artery systolic pressure. The tricuspid regurgitant velocity is 2.88 m/s, and with an assumed right atrial pressure of 8 mmHg, the estimated right ventricular systolic pressure is 96.2 mmHg. Left Atrium: Left atrial size was severely dilated. Right Atrium: Right atrial size was severely dilated. Pericardium: A small pericardial effusion is present. The pericardial effusion is circumferential. Mitral Valve: The mitral valve is grossly normal. Mild mitral valve regurgitation. No evidence of mitral valve stenosis. Tricuspid Valve: The tricuspid valve is abnormal. Tricuspid valve regurgitation is moderate to severe. No evidence of tricuspid stenosis. Aortic Valve: The aortic valve is tricuspid. Aortic valve regurgitation is not visualized. No aortic stenosis is present. Pulmonic Valve: The pulmonic valve was grossly normal. Pulmonic valve regurgitation is not visualized. No evidence of pulmonic stenosis. Aorta: The aortic root and ascending aorta are structurally normal, with no evidence of dilitation. Venous: The inferior vena cava is dilated in size with greater than 50% respiratory variability, suggesting right atrial pressure of 8 mmHg. LEFT VENTRICLE PLAX 2D LVIDd:         3.76 cm LVIDs:         2.99 cm LV PW:         0.98 cm LV IVS:        0.92 cm LVOT diam:     1.80 cm LV SV:         33 LV SV Index:   21 LVOT Area:     2.54 cm  RIGHT VENTRICLE RV Basal diam:  4.34 cm RV Mid diam:    3.34 cm TAPSE  (M-mode): 1.5 cm LEFT ATRIUM           Index       RIGHT ATRIUM           Index LA diam:      3.30 cm 2.11 cm/m  RA Area:     20.10 cm LA Vol (A4C): 93.0 ml 59.40 ml/m RA Volume:   60.60 ml  38.71 ml/m  AORTIC VALVE LVOT Vmax:   74.30 cm/s LVOT Vmean:  50.550 cm/s LVOT VTI:    0.132 m  AORTA Ao Root diam: 2.90 cm Ao Asc diam:  2.90 cm MITRAL VALVE                TRICUSPID VALVE MV Area (PHT): 5.16 cm     TR Peak grad:   33.2 mmHg MV Decel Time: 147 msec     TR Vmax:        288.00 cm/s MV E velocity: 122.50 cm/s                             SHUNTS                             Systemic VTI:  0.13 m                             Systemic Diam: 1.80 cm Eleonore Chiquito MD Electronically signed by Eleonore Chiquito MD Signature  Date/Time: 05/27/2021/2:14:01 PM    Final      Subjective: Pt reports that she is feeling much better, her oxygen requirement has not escalated.  She is reporting that she wants to go home today.    Discharge Exam: Vitals:   05/30/21 0403 05/30/21 0825  BP: 123/79   Pulse: (!) 57 74  Resp: 18   Temp: 97.6 F (36.4 C)   SpO2: 99%    Vitals:   05/29/21 2013 05/30/21 0403 05/30/21 0500 05/30/21 0825  BP: 122/77 123/79    Pulse: 67 (!) 57  74  Resp: 18 18    Temp: 98.3 F (36.8 C) 97.6 F (36.4 C)    TempSrc: Oral Oral    SpO2: 100% 99%    Weight:   57.9 kg   Height:       General: Pt is alert, awake, not in acute distress Cardiovascular: normal S1/S2 +, no rubs, no gallops Respiratory: CTA bilaterally, no wheezing, no rhonchi Abdominal: Soft, NT, ND, bowel sounds + Extremities: no edema, no cyanosis   The results of significant diagnostics from this hospitalization (including imaging, microbiology, ancillary and laboratory) are listed below for reference.     Microbiology: Recent Results (from the past 240 hour(s))  Resp Panel by RT-PCR (Flu A&B, Covid) Nasopharyngeal Swab     Status: Abnormal   Collection Time: 05/26/21  3:53 PM   Specimen: Nasopharyngeal Swab;  Nasopharyngeal(NP) swabs in vial transport medium  Result Value Ref Range Status   SARS Coronavirus 2 by RT PCR POSITIVE (A) NEGATIVE Final    Comment: RESULT CALLED TO, READ BACK BY AND VERIFIED WITH: LONG,L @ 1859 ON 05/26/21 BY JUW (NOTE) SARS-CoV-2 target nucleic acids are DETECTED.  The SARS-CoV-2 RNA is generally detectable in upper respiratory specimens during the acute phase of infection. Positive results are indicative of the presence of the identified virus, but do not rule out bacterial infection or co-infection with other pathogens not detected by the test. Clinical correlation with patient history and other diagnostic information is necessary to determine patient infection status. The expected result is Negative.  Fact Sheet for Patients: EntrepreneurPulse.com.au  Fact Sheet for Healthcare Providers: IncredibleEmployment.be  This test is not yet approved or cleared by the [place] FDA and  has been authorized for detection and/or diagnosis of SARS-CoV-2 by FDA under an Emergency Use Authorization (EUA).  This EUA will remain in effect (meaning this test can be u sed) for the duration of  the COVID-19 declaration under Section 564(b)(1) of the Act, 21 U.S.C. section 360bbb-3(b)(1), unless the authorization is terminated or revoked sooner.     Influenza A by PCR NEGATIVE NEGATIVE Final   Influenza B by PCR NEGATIVE NEGATIVE Final    Comment: (NOTE) The Xpert Xpress SARS-CoV-2/FLU/RSV plus assay is intended as an aid in the diagnosis of influenza from Nasopharyngeal swab specimens and should not be used as a sole basis for treatment. Nasal washings and aspirates are unacceptable for Xpert Xpress SARS-CoV-2/FLU/RSV testing.  Fact Sheet for Patients: EntrepreneurPulse.com.au  Fact Sheet for Healthcare Providers: IncredibleEmployment.be  This test is not yet approved or cleared by the [place] FDA and has been authorized for detection and/or diagnosis of SARS-CoV-2 by FDA under an Emergency Use Authorization (EUA). This EUA will remain in effect (meaning this test can be used) for the duration of the COVID-19 declaration under Section 564(b)(1) of the Act, 21 U.S.C. section 360bbb-3(b)(1), unless the authorization is terminated or revoked.  Performed at [facility]  [facility], [address], [place], [place] [address]   Blood Culture (routine x 2)     Status: None (Preliminary result)   Collection Time: 05/26/21  8:32 PM   Specimen: BLOOD RIGHT HAND  Result Value Ref Range Status   Specimen Description BLOOD RIGHT HAND  Final   Special Requests   Final    BOTTLES DRAWN AEROBIC ONLY Blood Culture adequate volume   Culture   Final    NO GROWTH 4 DAYS Performed at [facility], [address] [place], [place] [address]    Report Status PENDING  Incomplete  Blood Culture (routine x 2)     Status: None (Preliminary result)   Collection Time: 05/26/21  8:37 PM   Specimen: Left Antecubital; Blood  Result Value Ref Range Status   Specimen Description LEFT ANTECUBITAL  Final   Special Requests   Final    BOTTLES DRAWN AEROBIC AND ANAEROBIC Blood Culture adequate volume   Culture   Final    NO GROWTH 4 DAYS Performed at [facility], [address], [place], [place] [address]    Report Status PENDING  Incomplete     Labs: BNP (last 3 results) Recent Labs    12/04/20 1652 05/26/21 1708  BNP 22.0 078.6*   Basic Metabolic Panel: Recent Labs  Lab 05/26/21 2037 05/27/21 0600 05/28/21 0549 05/29/21 0356 05/30/21 0453  NA 138 139 136 136 134*  K 3.4* 3.4* 3.7 4.0 3.7  CL 94* 91* 91* 92* 92*  CO2 35* 36* 35* 34* 34*  GLUCOSE 140* 127* 113* 148* 156*  BUN 14 15 28* 31* 38*  CREATININE 0.69 0.79 0.69 0.61 0.63  CALCIUM 8.6* 8.8* 9.1 8.7* 8.5*  MG 1.7 1.8 1.8 2.1 2.1  PHOS  --  3.7 2.0* 2.9 3.2   Liver Function Tests: Recent Labs  Lab 05/26/21 2037 05/27/21 0600  05/28/21 0549 05/29/21 0356 05/30/21 0453  AST 27 25 22 21 19   ALT 16 15 14 14 14   ALKPHOS 54 52 51 48 44  BILITOT 1.5* 1.2 1.4* 0.9 0.8  PROT 6.7 6.6 6.2* 6.0* 5.4*  ALBUMIN 3.8 3.6 3.4* 3.2* 2.9*   No results for input(s): LIPASE, AMYLASE in the last 168 hours. No results for input(s): AMMONIA in the last 168 hours. CBC: Recent Labs  Lab 05/26/21 1628 05/27/21 0600 05/28/21 0549 05/29/21 0356 05/30/21 0453  WBC 8.0 10.8* 14.2* 9.5 9.2  NEUTROABS  --  9.1* 12.1* 8.6* 8.1*  HGB 13.9 13.9 13.8 13.9 13.6  HCT 42.3 42.7 43.0 42.1 41.5  MCV 95.1 94.5 93.7 93.6 92.2  PLT 86* 100* 107* 142* 184   Cardiac Enzymes: No results for input(s): CKTOTAL, CKMB, CKMBINDEX, TROPONINI in the last 168 hours. BNP: Invalid input(s): POCBNP CBG: No results for input(s): GLUCAP in the last 168 hours. D-Dimer Recent Labs    05/29/21 0356 05/30/21 0453  DDIMER 0.80* 0.63*   Hgb A1c No results for input(s): HGBA1C in the last 72 hours. Lipid Profile No results for input(s): CHOL, HDL, LDLCALC, TRIG, CHOLHDL, LDLDIRECT in the last 72 hours. Thyroid function studies No results for input(s): TSH, T4TOTAL, T3FREE, THYROIDAB in the last 72 hours.  Invalid input(s): FREET3 Anemia work up Recent Labs    05/29/21 0356 05/30/21 0453  FERRITIN 313* 331*   Urinalysis    Component Value Date/Time   COLORURINE YELLOW 06/10/2020 1041   APPEARANCEUR CLOUDY (A) 06/10/2020 1041   LABSPEC 1.009 06/10/2020 1041   PHURINE 9.0 (H) 06/10/2020 1041  GLUCOSEU NEGATIVE 06/10/2020 [place] 06/10/2020 1041   [place] 06/10/2020 1041   [place] 06/10/2020 1041   PROTEINUR NEGATIVE 06/10/2020 1041   UROBILINOGEN 0.2 04/28/2012 2308   NITRITE NEGATIVE 06/10/2020 1041   LEUKOCYTESUR NEGATIVE 06/10/2020 1041   Sepsis Labs Invalid input(s): PROCALCITONIN,  WBC,  LACTICIDVEN Microbiology Recent Results (from the past 240 hour(s))  Resp Panel by RT-PCR (Flu A&B,  Covid) Nasopharyngeal Swab     Status: Abnormal   Collection Time: 05/26/21  3:53 PM   Specimen: Nasopharyngeal Swab; Nasopharyngeal(NP) swabs in vial transport medium  Result Value Ref Range Status   SARS Coronavirus 2 by RT PCR POSITIVE (A) NEGATIVE Final    Comment: RESULT CALLED TO, READ BACK BY AND VERIFIED WITH: LONG,L @ 1859 ON 05/26/21 BY JUW (NOTE) SARS-CoV-2 target nucleic acids are DETECTED.  The SARS-CoV-2 RNA is generally detectable in upper respiratory specimens during the acute phase of infection. Positive results are indicative of the presence of the identified virus, but do not rule out bacterial infection or co-infection with other pathogens not detected by the test. Clinical correlation with patient history and other diagnostic information is necessary to determine patient infection status. The expected result is Negative.  Fact Sheet for Patients: EntrepreneurPulse.com.au  Fact Sheet for Healthcare Providers: IncredibleEmployment.be  This test is not yet approved or cleared by the [place] FDA and  has been authorized for detection and/or diagnosis of SARS-CoV-2 by FDA under an Emergency Use Authorization (EUA).  This EUA will remain in effect (meaning this test can be u sed) for the duration of  the COVID-19 declaration under Section 564(b)(1) of the Act, 21 U.S.C. section 360bbb-3(b)(1), unless the authorization is terminated or revoked sooner.     Influenza A by PCR NEGATIVE NEGATIVE Final   Influenza B by PCR NEGATIVE NEGATIVE Final    Comment: (NOTE) The Xpert Xpress SARS-CoV-2/FLU/RSV plus assay is intended as an aid in the diagnosis of influenza from Nasopharyngeal swab specimens and should not be used as a sole basis for treatment. Nasal washings and aspirates are unacceptable for Xpert Xpress SARS-CoV-2/FLU/RSV testing.  Fact Sheet for Patients: EntrepreneurPulse.com.au  Fact Sheet for  Healthcare Providers: IncredibleEmployment.be  This test is not yet approved or cleared by the [place] FDA and has been authorized for detection and/or diagnosis of SARS-CoV-2 by FDA under an Emergency Use Authorization (EUA). This EUA will remain in effect (meaning this test can be used) for the duration of the COVID-19 declaration under Section 564(b)(1) of the Act, 21 U.S.C. section 360bbb-3(b)(1), unless the authorization is terminated or revoked.  Performed at [facility], [address] [place], [place] [address]   Blood Culture (routine x 2)     Status: None (Preliminary result)   Collection Time: 05/26/21  8:32 PM   Specimen: BLOOD RIGHT HAND  Result Value Ref Range Status   Specimen Description BLOOD RIGHT HAND  Final   Special Requests   Final    BOTTLES DRAWN AEROBIC ONLY Blood Culture adequate volume   Culture   Final    NO GROWTH 4 DAYS Performed at [facility], [address] [place], [place] [address]    Report Status PENDING  Incomplete  Blood Culture (routine x 2)     Status: None (Preliminary result)   Collection Time: 05/26/21  8:37 PM   Specimen: Left Antecubital; Blood  Result Value Ref Range Status   Specimen Description LEFT ANTECUBITAL  Final   Special Requests  Final    BOTTLES DRAWN AEROBIC AND ANAEROBIC Blood Culture adequate volume   Culture   Final    NO GROWTH 4 DAYS Performed at [facility], [address] [place], [place] [address]    Report Status PENDING  Incomplete   Time coordinating discharge: 35 minutes   SIGNED:  Irwin Brakeman, MD  [facility] 05/30/2021, 11:14 AM How to contact the [facility] Attending or Consulting provider [place] or covering provider during after hours [place], for this patient?  Check the care team in [facility] and look for a) attending/consulting [facility] provider listed and b) the [facility] team listed Log into [email or web address] and use [place]'s universal password to access. If you do not have  the password, please contact the hospital operator. Locate the [facility] provider you are looking for under [facility] and page to a number that you can be directly reached. If you still have difficulty reaching the provider, please page the [facility] (Director on Call) for the Hospitalists listed on amion for assistance.

## 2021-05-31 ENCOUNTER — Ambulatory Visit: Payer: PPO | Admitting: Cardiology

## 2021-05-31 LAB — CULTURE, BLOOD (ROUTINE X 2)
Culture: NO GROWTH
Culture: NO GROWTH
Special Requests: ADEQUATE
Special Requests: ADEQUATE

## 2021-06-04 DIAGNOSIS — U071 COVID-19: Secondary | ICD-10-CM | POA: Diagnosis not present

## 2021-06-04 DIAGNOSIS — Z681 Body mass index (BMI) 19 or less, adult: Secondary | ICD-10-CM | POA: Diagnosis not present

## 2021-06-04 DIAGNOSIS — M1991 Primary osteoarthritis, unspecified site: Secondary | ICD-10-CM | POA: Diagnosis not present

## 2021-06-04 DIAGNOSIS — R059 Cough, unspecified: Secondary | ICD-10-CM | POA: Diagnosis not present

## 2021-06-05 ENCOUNTER — Telehealth: Payer: Self-pay | Admitting: Nurse Practitioner

## 2021-06-05 NOTE — Telephone Encounter (Signed)
Attempted to reach patient/daughter to offer to schedule a Palliative Consult, no answer - left message with reason for call along with my name and call back number, requesting a return call to schedule visit

## 2021-06-07 DIAGNOSIS — D696 Thrombocytopenia, unspecified: Secondary | ICD-10-CM | POA: Diagnosis not present

## 2021-06-07 DIAGNOSIS — Z9981 Dependence on supplemental oxygen: Secondary | ICD-10-CM | POA: Diagnosis not present

## 2021-06-07 DIAGNOSIS — I081 Rheumatic disorders of both mitral and tricuspid valves: Secondary | ICD-10-CM | POA: Diagnosis not present

## 2021-06-07 DIAGNOSIS — Z9181 History of falling: Secondary | ICD-10-CM | POA: Diagnosis not present

## 2021-06-07 DIAGNOSIS — I69351 Hemiplegia and hemiparesis following cerebral infarction affecting right dominant side: Secondary | ICD-10-CM | POA: Diagnosis not present

## 2021-06-07 DIAGNOSIS — I110 Hypertensive heart disease with heart failure: Secondary | ICD-10-CM | POA: Diagnosis not present

## 2021-06-07 DIAGNOSIS — I509 Heart failure, unspecified: Secondary | ICD-10-CM | POA: Diagnosis not present

## 2021-06-07 DIAGNOSIS — H9190 Unspecified hearing loss, unspecified ear: Secondary | ICD-10-CM | POA: Diagnosis not present

## 2021-06-07 DIAGNOSIS — H547 Unspecified visual loss: Secondary | ICD-10-CM | POA: Diagnosis not present

## 2021-06-07 DIAGNOSIS — Z7952 Long term (current) use of systemic steroids: Secondary | ICD-10-CM | POA: Diagnosis not present

## 2021-06-07 DIAGNOSIS — K219 Gastro-esophageal reflux disease without esophagitis: Secondary | ICD-10-CM | POA: Diagnosis not present

## 2021-06-07 DIAGNOSIS — Z7901 Long term (current) use of anticoagulants: Secondary | ICD-10-CM | POA: Diagnosis not present

## 2021-06-07 DIAGNOSIS — U071 COVID-19: Secondary | ICD-10-CM | POA: Diagnosis not present

## 2021-06-07 DIAGNOSIS — J9611 Chronic respiratory failure with hypoxia: Secondary | ICD-10-CM | POA: Diagnosis not present

## 2021-06-07 DIAGNOSIS — Z792 Long term (current) use of antibiotics: Secondary | ICD-10-CM | POA: Diagnosis not present

## 2021-06-07 DIAGNOSIS — I482 Chronic atrial fibrillation, unspecified: Secondary | ICD-10-CM | POA: Diagnosis not present

## 2021-06-11 DIAGNOSIS — J9611 Chronic respiratory failure with hypoxia: Secondary | ICD-10-CM | POA: Diagnosis not present

## 2021-06-11 DIAGNOSIS — D696 Thrombocytopenia, unspecified: Secondary | ICD-10-CM | POA: Diagnosis not present

## 2021-06-11 DIAGNOSIS — I69351 Hemiplegia and hemiparesis following cerebral infarction affecting right dominant side: Secondary | ICD-10-CM | POA: Diagnosis not present

## 2021-06-11 DIAGNOSIS — H547 Unspecified visual loss: Secondary | ICD-10-CM | POA: Diagnosis not present

## 2021-06-11 DIAGNOSIS — Z7901 Long term (current) use of anticoagulants: Secondary | ICD-10-CM | POA: Diagnosis not present

## 2021-06-11 DIAGNOSIS — U071 COVID-19: Secondary | ICD-10-CM | POA: Diagnosis not present

## 2021-06-11 DIAGNOSIS — Z9181 History of falling: Secondary | ICD-10-CM | POA: Diagnosis not present

## 2021-06-11 DIAGNOSIS — Z9981 Dependence on supplemental oxygen: Secondary | ICD-10-CM | POA: Diagnosis not present

## 2021-06-11 DIAGNOSIS — H9190 Unspecified hearing loss, unspecified ear: Secondary | ICD-10-CM | POA: Diagnosis not present

## 2021-06-11 DIAGNOSIS — I110 Hypertensive heart disease with heart failure: Secondary | ICD-10-CM | POA: Diagnosis not present

## 2021-06-11 DIAGNOSIS — I482 Chronic atrial fibrillation, unspecified: Secondary | ICD-10-CM | POA: Diagnosis not present

## 2021-06-11 DIAGNOSIS — K219 Gastro-esophageal reflux disease without esophagitis: Secondary | ICD-10-CM | POA: Diagnosis not present

## 2021-06-11 DIAGNOSIS — I081 Rheumatic disorders of both mitral and tricuspid valves: Secondary | ICD-10-CM | POA: Diagnosis not present

## 2021-06-11 DIAGNOSIS — Z7952 Long term (current) use of systemic steroids: Secondary | ICD-10-CM | POA: Diagnosis not present

## 2021-06-11 DIAGNOSIS — Z792 Long term (current) use of antibiotics: Secondary | ICD-10-CM | POA: Diagnosis not present

## 2021-06-11 DIAGNOSIS — I509 Heart failure, unspecified: Secondary | ICD-10-CM | POA: Diagnosis not present

## 2021-06-15 ENCOUNTER — Telehealth: Payer: Self-pay | Admitting: Nurse Practitioner

## 2021-06-15 NOTE — Telephone Encounter (Signed)
Attempted to contact patient's daughter, Enid Derry, to offer to schedule a Palliative Consult, no answer - left message with reason for call along with my name and call back number.  I also spoke with patient's son, Marc Morgans, to let him know that I was trying to reach daughter to schedule an appointment, he took my name and number and said that he would give it to her and ask her to call me back.

## 2021-06-15 NOTE — Telephone Encounter (Signed)
Rec'd call back from daughter Enid Derry and after discussing the Palliative referral with her she stated that patient has a NP coming out to see her from [facility].  Daughter has declined our services at this time but stated that if she decides later on that she would like for [place] to come out that she would call us back.  I told her that I would cancel the referral out and notify Dr. Gerarda Fraction and she was in agreement with this.

## 2021-06-17 DIAGNOSIS — J9611 Chronic respiratory failure with hypoxia: Secondary | ICD-10-CM | POA: Diagnosis not present

## 2021-06-17 DIAGNOSIS — I110 Hypertensive heart disease with heart failure: Secondary | ICD-10-CM | POA: Diagnosis not present

## 2021-06-17 DIAGNOSIS — U071 COVID-19: Secondary | ICD-10-CM | POA: Diagnosis not present

## 2021-06-17 DIAGNOSIS — I482 Chronic atrial fibrillation, unspecified: Secondary | ICD-10-CM | POA: Diagnosis not present

## 2021-06-19 DIAGNOSIS — I700 Atherosclerosis of aorta: Secondary | ICD-10-CM | POA: Diagnosis not present

## 2021-06-19 DIAGNOSIS — E782 Mixed hyperlipidemia: Secondary | ICD-10-CM | POA: Diagnosis not present

## 2021-06-19 DIAGNOSIS — I4891 Unspecified atrial fibrillation: Secondary | ICD-10-CM | POA: Diagnosis not present

## 2021-06-19 DIAGNOSIS — I10 Essential (primary) hypertension: Secondary | ICD-10-CM | POA: Diagnosis not present

## 2021-06-19 DIAGNOSIS — Z6821 Body mass index (BMI) 21.0-21.9, adult: Secondary | ICD-10-CM | POA: Diagnosis not present

## 2021-06-19 DIAGNOSIS — Z0000 Encounter for general adult medical examination without abnormal findings: Secondary | ICD-10-CM | POA: Diagnosis not present

## 2021-06-26 ENCOUNTER — Other Ambulatory Visit: Payer: Self-pay

## 2021-06-26 ENCOUNTER — Encounter: Payer: Self-pay | Admitting: Cardiology

## 2021-06-26 ENCOUNTER — Ambulatory Visit: Payer: PPO | Admitting: Cardiology

## 2021-06-26 VITALS — BP 120/62 | HR 84 | Ht 61.0 in | Wt 123.6 lb

## 2021-06-26 DIAGNOSIS — I4821 Permanent atrial fibrillation: Secondary | ICD-10-CM

## 2021-06-26 DIAGNOSIS — I890 Lymphedema, not elsewhere classified: Secondary | ICD-10-CM

## 2021-06-26 DIAGNOSIS — I10 Essential (primary) hypertension: Secondary | ICD-10-CM | POA: Diagnosis not present

## 2021-06-26 DIAGNOSIS — R0902 Hypoxemia: Secondary | ICD-10-CM | POA: Diagnosis not present

## 2021-06-26 DIAGNOSIS — I509 Heart failure, unspecified: Secondary | ICD-10-CM | POA: Diagnosis not present

## 2021-06-26 NOTE — Progress Notes (Signed)
Cardiology Office Note  Date: 06/26/2021   ID: Kathryn Marquez, DOB June 13, 1921, MRN [id number]  PCP:  Redmond School, MD  Cardiologist:  Rozann Lesches, MD Electrophysiologist:  None   Chief Complaint  Patient presents with   Cardiac follow-up    History of Present Illness: Kathryn Marquez is a 87 y.o. female seen in consultation back in April.  I reviewed the interval chart.  She was hospitalized in July with COVID-19, discharge summary reviewed.  Chest x-ray and echocardiogram also noted below.  Lasix was increased to 40 mg daily with potassium supplement at the last visit.  Follow-up lab work is noted below.  She has had some exacerbation of leg swelling recently after taking a car trip [place] to the [place].  We did talk about elevating her legs when she is seated at home in her recliner.  Echocardiogram done in July revealed LVEF 55 to 60%, mildly reduced RV contraction with severe right ventricular enlargement and RVSP estimated 41 mmHg, severe biatrial enlargement, small pericardial effusion, mild mitral regurgitation, and moderate to severe tricuspid regurgitation.  I reviewed her medications which are noted below.  Past Medical History:  Diagnosis Date   Atrial fibrillation [facility])    Essential hypertension    History of stroke 04/29/2012   Recurrent Clostridioides difficile diarrhea    Thrombocytopenia (HCC)    TIA (transient ischemic attack)     Past Surgical History:  Procedure Laterality Date   No prior surgery      Current Outpatient Medications  Medication Sig Dispense Refill   ascorbic acid (VITAMIN C) 500 MG tablet Take 500 mg by mouth daily.     calcium-vitamin D (OSCAL WITH D) 500-200 MG-UNIT tablet Take 1 tablet by mouth daily with breakfast.     cetirizine (ZYRTEC) 10 MG tablet Take 10 mg by mouth in the morning.      Cyanocobalamin (B-12 PO) Take 1 tablet by mouth daily.     furosemide (LASIX) 40 MG tablet Take 1 tablet (40 mg total) by mouth daily.  90 tablet 3   guaiFENesin-dextromethorphan (ROBITUSSIN DM) 100-10 MG/5ML syrup Take 10 mLs by mouth every 4 (four) hours as needed for cough. 118 mL 0   metoprolol succinate (TOPROL-XL) 25 MG 24 hr tablet Take 1 tablet (25 mg total) by mouth daily. New blood pressure medicine. 30 tablet 2   Multiple Vitamins-Minerals (VITAMIN D3 COMPLETE PO) Take 1 tablet by mouth daily.     Omeprazole 20 MG TBEC Take 1 tablet (20 mg total) by mouth at bedtime. 30 tablet 0   potassium chloride (KLOR-CON) 10 MEQ tablet Take 1 tablet (10 mEq total) by mouth daily. 90 tablet 3   saccharomyces boulardii (FLORASTOR) 250 MG capsule Take 250 mg by mouth 2 (two) times daily.     XARELTO 15 MG TABS tablet Take 15 mg by mouth in the morning.      zinc sulfate 220 (50 Zn) MG capsule Take 1 capsule (220 mg total) by mouth daily. 30 capsule 0   predniSONE (DELTASONE) 10 MG tablet Take 3 tabs po QAM x 3 days, then 2 po QAM x 3 days, then 1 po QAM x 3 days then stop (Patient not taking: Reported on 06/26/2021) 18 tablet 0   No current facility-administered medications for this visit.   Allergies:  Patient has no known allergies.   ROS: No orthopnea or PND.  Physical Exam: VS:  BP 120/62   Pulse 84   Ht '5\' 1"'$  (  1.549 m)   Wt 123 lb 9.6 oz (56.1 kg)   SpO2 96%   BMI 23.35 kg/m , BMI Body mass index is 23.35 kg/m.  Wt Readings from Last 3 Encounters:  06/26/21 123 lb 9.6 oz (56.1 kg)  05/30/21 127 lb 10.3 oz (57.9 kg)  03/07/21 140 lb (63.5 kg)    General: Pleasant elderly woman, appears comfortable at rest. HEENT: Conjunctiva and lids normal, wearing a mask. Neck: Supple, no elevated JVP or carotid bruits, no thyromegaly. Lungs: Clear to auscultation, nonlabored breathing at rest. Cardiac: Irregularly irregular, no S3, 1/6 systolic murmur, no pericardial rub. Extremities: Lymphedema.  ECG:  An ECG dated 05/26/2021 was personally reviewed today and demonstrated:  Atrial fibrillation with rightward axis and  nonspecific T wave changes.  Recent Labwork: 05/26/2021: B Natriuretic Peptide 247.0 05/30/2021: ALT 14; AST 19; BUN 38; Creatinine, Ser 0.63; Hemoglobin 13.6; Magnesium 2.1; Platelets 184; Potassium 3.7; Sodium 134     Component Value Date/Time   CHOL 199 04/29/2012 0518   TRIG 37 05/26/2021 2037   HDL 74 04/29/2012 0518   CHOLHDL 2.7 04/29/2012 0518   VLDL 22 04/29/2012 0518   LDLCALC 103 (H) 04/29/2012 0518    Other Studies Reviewed Today:  Chest x-ray 05/26/2021: FINDINGS: Trace right-sided pleural effusion. Chronic pleural calcification and scarring at left lung base. Cardiomegaly with aortic atherosclerosis. No acute airspace disease   IMPRESSION: 1. Chronic calcified pleural plaques and scarring on the left 2. Cardiomegaly with probable trace right pleural effusion  Echocardiogram 05/27/2021:  1. Left ventricular ejection fraction, by estimation, is 55 to 60%. The  left ventricle has normal function. Left ventricular diastolic function  could not be evaluated. There is the interventricular septum is flattened  in systole and diastole, consistent   with right ventricular pressure and volume overload.   2. Right ventricular systolic function is mildly reduced. The right  ventricular size is severely enlarged. There is mildly elevated pulmonary  artery systolic pressure. The estimated right ventricular systolic  pressure is [id number] mmHg.   3. Left atrial size was severely dilated.   4. Right atrial size was severely dilated.   5. A small pericardial effusion is present. The pericardial effusion is  circumferential.   6. The mitral valve is grossly normal. Mild mitral valve regurgitation.  No evidence of mitral stenosis.   7. The tricuspid valve is abnormal. Tricuspid valve regurgitation is  moderate to severe.   8. The aortic valve is tricuspid. Aortic valve regurgitation is not  visualized. No aortic stenosis is present.   9. The inferior vena cava is dilated in size with  >50% respiratory  variability, suggesting right atrial pressure of 8 mmHg.   Assessment and Plan:  1.  Permanent atrial fibrillation with CHA2DS2-VASc score of 7.  She remains on Xarelto, no spontaneous bleeding problems.  I reviewed her recent lab work.  Heart rate well controlled on Toprol-XL.  Recent echocardiogram also reviewed.  2.  Leg edema and lymphedema.  Continue with present diuretic regimen and potassium supplement.  Elevate the legs when able.  3.  Essential hypertension by history, blood pressure is normal today.  Medication Adjustments/Labs and Tests Ordered: Current medicines are reviewed at length with the patient today.  Concerns regarding medicines are outlined above.   Tests Ordered: No orders of the defined types were placed in this encounter.   Medication Changes: No orders of the defined types were placed in this encounter.   Disposition:  Follow up  6 months.  Signed, Satira Sark, MD, [facility] 06/26/2021 11:20 AM    [facility]. [address] [place], [place] [address] Phone: [phone]; Fax: [phone]

## 2021-06-26 NOTE — Patient Instructions (Signed)

## 2021-07-03 ENCOUNTER — Ambulatory Visit: Payer: PPO | Admitting: Student

## 2021-07-27 DIAGNOSIS — I509 Heart failure, unspecified: Secondary | ICD-10-CM | POA: Diagnosis not present

## 2021-07-27 DIAGNOSIS — R0902 Hypoxemia: Secondary | ICD-10-CM | POA: Diagnosis not present

## 2021-08-13 ENCOUNTER — Other Ambulatory Visit (HOSPITAL_COMMUNITY): Payer: Self-pay

## 2021-08-13 DIAGNOSIS — D696 Thrombocytopenia, unspecified: Secondary | ICD-10-CM

## 2021-08-14 ENCOUNTER — Inpatient Hospital Stay (HOSPITAL_COMMUNITY): Payer: PPO | Attending: Hematology

## 2021-08-14 DIAGNOSIS — D696 Thrombocytopenia, unspecified: Secondary | ICD-10-CM | POA: Insufficient documentation

## 2021-08-14 DIAGNOSIS — Z7901 Long term (current) use of anticoagulants: Secondary | ICD-10-CM | POA: Diagnosis not present

## 2021-08-14 DIAGNOSIS — I4891 Unspecified atrial fibrillation: Secondary | ICD-10-CM | POA: Insufficient documentation

## 2021-08-14 LAB — VITAMIN B12: Vitamin B-12: 1825 pg/mL — ABNORMAL HIGH (ref 180–914)

## 2021-08-14 LAB — CBC WITH DIFFERENTIAL/PLATELET
Abs Immature Granulocytes: 0.01 10*3/uL (ref 0.00–0.07)
Basophils Absolute: 0 10*3/uL (ref 0.0–0.1)
Basophils Relative: 1 %
Eosinophils Absolute: 0 10*3/uL (ref 0.0–0.5)
Eosinophils Relative: 0 %
HCT: 39.8 % (ref 36.0–46.0)
Hemoglobin: 12.2 g/dL (ref 12.0–15.0)
Immature Granulocytes: 0 %
Lymphocytes Relative: 22 %
Lymphs Abs: 1.1 10*3/uL (ref 0.7–4.0)
MCH: 31 pg (ref 26.0–34.0)
MCHC: 30.7 g/dL (ref 30.0–36.0)
MCV: 101 fL — ABNORMAL HIGH (ref 80.0–100.0)
Monocytes Absolute: 0.6 10*3/uL (ref 0.1–1.0)
Monocytes Relative: 11 %
Neutro Abs: 3.4 10*3/uL (ref 1.7–7.7)
Neutrophils Relative %: 66 %
Platelets: 118 10*3/uL — ABNORMAL LOW (ref 150–400)
RBC: 3.94 MIL/uL (ref 3.87–5.11)
RDW: 14.3 % (ref 11.5–15.5)
WBC: 5.1 10*3/uL (ref 4.0–10.5)
nRBC: 0 % (ref 0.0–0.2)

## 2021-08-14 LAB — LACTATE DEHYDROGENASE: LDH: 152 U/L (ref 98–192)

## 2021-08-14 LAB — FOLATE: Folate: 31.2 ng/mL (ref >5.9)

## 2021-08-15 LAB — METHYLMALONIC ACID, SERUM: Methylmalonic Acid, Quantitative: 200 nmol/L (ref 0–378)

## 2021-08-16 LAB — COPPER, SERUM: Copper: 115 ug/dL (ref 80–158)

## 2021-08-16 LAB — PROTEIN ELECTROPHORESIS, SERUM
A/G Ratio: 1.6 (ref 0.7–1.7)
Albumin ELP: 3.6 g/dL (ref 2.9–4.4)
Alpha-1-Globulin: 0.3 g/dL (ref 0.0–0.4)
Alpha-2-Globulin: 0.6 g/dL (ref 0.4–1.0)
Beta Globulin: 0.7 g/dL (ref 0.7–1.3)
Gamma Globulin: 0.7 g/dL (ref 0.4–1.8)
Globulin, Total: 2.2 g/dL (ref 2.2–3.9)
M-Spike, %: 0.2 g/dL — ABNORMAL HIGH
Total Protein ELP: 5.8 g/dL — ABNORMAL LOW (ref 6.0–8.5)

## 2021-08-20 NOTE — Progress Notes (Signed)
Kathryn Marquez, [place] [address]   CLINIC:  Medical Oncology/Hematology  PCP:  Redmond School, [place] / [place] [address]  [phone]  REASON FOR VISIT:  Follow-up for thrombocytopenia  PRIOR THERAPY: none  CURRENT THERAPY: Xarelto  INTERVAL HISTORY:  [name], a 87 y.o. female, returns for routine follow-up for her thrombocytopenia. Runa was last seen on 08/21/2020.  Today she reports feeling good. She denies any current bleeding and black stools, but she reports easy bruising on her arms. She is currently taking Xarelto. She denies any new pains. She walks with the assistance of a walker, and she denies any falls since her last visit. Her appetite is good and she denies weight loss.   REVIEW OF SYSTEMS:  Review of Systems  Constitutional:  Negative for appetite change (80%), fatigue (60%) and unexpected weight change.  HENT:   Negative for nosebleeds.   Respiratory:  Positive for shortness of breath. Negative for hemoptysis.   Gastrointestinal:  Negative for blood in stool.  Genitourinary:  Positive for difficulty urinating. Negative for hematuria and vaginal bleeding.   Musculoskeletal:  Negative for arthralgias and myalgias.  Neurological:  Positive for headaches and numbness.  Hematological:  Bruises/bleeds easily (arms).  All other systems reviewed and are negative.  PAST MEDICAL/SURGICAL HISTORY:  Past Medical History:  Diagnosis Date   Atrial fibrillation [facility])    Essential hypertension    History of stroke 04/29/2012   Recurrent Clostridioides difficile diarrhea    Thrombocytopenia (HCC)    TIA (transient ischemic attack)    Past Surgical History:  Procedure Laterality Date   No prior surgery      SOCIAL HISTORY:  Social History   Socioeconomic History   Marital status: Widowed    Spouse name: Not on file   Number of children: Not on file   Years of education: Not on file   Highest  education level: Not on file  Occupational History   Not on file  Tobacco Use   Smoking status: Never   Smokeless tobacco: Never  Vaping Use   Vaping Use: Never used  Substance and Sexual Activity   Alcohol use: No   Drug use: No   Sexual activity: Not on file  Other Topics Concern   Not on file  Social History Narrative   Not on file   Social Determinants of Health   Financial Resource Strain: Not on file  Food Insecurity: Not on file  Transportation Needs: Not on file  Physical Activity: Not on file  Stress: Not on file  Social Connections: Not on file  Intimate Partner Violence: Not on file    FAMILY HISTORY:  Family History  Problem Relation Age of Onset   Stroke Other    Cancer Other    Heart attack Mother    Fibromyalgia Mother    Hypercholesterolemia Mother    Leukemia Sister    Cancer Brother    Stroke Father    Heart attack Brother    Diabetes Son    Fibromyalgia Son    Hypertension Son     CURRENT MEDICATIONS:  Current Outpatient Medications  Medication Sig Dispense Refill   ascorbic acid (VITAMIN C) 500 MG tablet Take 500 mg by mouth daily.     calcium-vitamin D (OSCAL WITH D) 500-200 MG-UNIT tablet Take 1 tablet by mouth daily with breakfast.     cetirizine (ZYRTEC) 10 MG tablet Take 10 mg by mouth in  the morning.      Cyanocobalamin (B-12 PO) Take 1 tablet by mouth daily.     furosemide (LASIX) 40 MG tablet Take 1 tablet (40 mg total) by mouth daily. 90 tablet 3   guaiFENesin-dextromethorphan (ROBITUSSIN DM) 100-10 MG/5ML syrup Take 10 mLs by mouth every 4 (four) hours as needed for cough. 118 mL 0   metoprolol succinate (TOPROL-XL) 25 MG 24 hr tablet Take 1 tablet (25 mg total) by mouth daily. New blood pressure medicine. 30 tablet 2   Multiple Vitamins-Minerals (VITAMIN D3 COMPLETE PO) Take 1 tablet by mouth daily.     Omeprazole 20 MG TBEC Take 1 tablet (20 mg total) by mouth at bedtime. 30 tablet 0   potassium chloride (KLOR-CON) 10 MEQ tablet  Take 1 tablet (10 mEq total) by mouth daily. 90 tablet 3   predniSONE (DELTASONE) 10 MG tablet Take 3 tabs po QAM x 3 days, then 2 po QAM x 3 days, then 1 po QAM x 3 days then stop (Patient not taking: Reported on 06/26/2021) 18 tablet 0   saccharomyces boulardii (FLORASTOR) 250 MG capsule Take 250 mg by mouth 2 (two) times daily.     XARELTO 15 MG TABS tablet Take 15 mg by mouth in the morning.      zinc sulfate 220 (50 Zn) MG capsule Take 1 capsule (220 mg total) by mouth daily. 30 capsule 0   No current facility-administered medications for this visit.    ALLERGIES:  No Known Allergies  PHYSICAL EXAM:  Performance status (ECOG): 2 - Symptomatic, <50% confined to bed  There were no vitals filed for this visit. Wt Readings from Last 3 Encounters:  06/26/21 123 lb 9.6 oz (56.1 kg)  05/30/21 127 lb 10.3 oz (57.9 kg)  03/07/21 140 lb (63.5 kg)   Physical Exam Vitals reviewed.  Constitutional:      Appearance: Normal appearance.     Comments: In wheelchair  Cardiovascular:     Rate and Rhythm: Normal rate. Rhythm irregular.     Pulses: Normal pulses.     Heart sounds: Normal heart sounds.  Pulmonary:     Effort: Pulmonary effort is normal.     Breath sounds: Normal breath sounds.  Musculoskeletal:     Right lower leg: 2+ Edema present.     Left lower leg: 2+ Edema present.  Neurological:     General: No focal deficit present.     Mental Status: She is alert and oriented to person, place, and time.  Psychiatric:        Mood and Affect: Mood normal.        Behavior: Behavior normal.    LABORATORY DATA:  I have reviewed the labs as listed.  CBC Latest Ref Rng & Units 08/14/2021 05/30/2021 05/29/2021  WBC 4.0 - 10.5 K/uL 5.1 9.2 9.5  Hemoglobin 12.0 - 15.0 g/dL 12.2 13.6 13.9  Hematocrit 36.0 - 46.0 % 39.8 41.5 42.1  Platelets 150 - 400 K/uL 118(L) 184 142(L)   CMP Latest Ref Rng & Units 05/30/2021 05/29/2021 05/28/2021  Glucose 70 - 99 mg/dL 156(H) 148(H) 113(H)  BUN 8 - 23  mg/dL 38(H) 31(H) 28(H)  Creatinine 0.44 - 1.00 mg/dL 0.63 0.61 0.69  Sodium 135 - 145 mmol/L 134(L) 136 136  Potassium 3.5 - 5.1 mmol/L 3.7 4.0 3.7  Chloride 98 - 111 mmol/L 92(L) 92(L) 91(L)  CO2 22 - 32 mmol/L 34(H) 34(H) 35(H)  Calcium 8.9 - 10.3 mg/dL 8.5(L) 8.7(L) 9.1  Total Protein  6.5 - 8.1 g/dL 5.4(L) 6.0(L) 6.2(L)  Total Bilirubin 0.3 - 1.2 mg/dL 0.8 0.9 1.4(H)  Alkaline Phos 38 - 126 U/L 44 48 51  AST 15 - 41 U/L 19 21 22   ALT 0 - 44 U/L 14 14 14       Component Value Date/Time   RBC 3.94 08/14/2021 1225   MCV 101.0 (H) 08/14/2021 1225   MCH 31.0 08/14/2021 1225   MCHC 30.7 08/14/2021 1225   RDW 14.3 08/14/2021 1225   LYMPHSABS 1.1 08/14/2021 1225   MONOABS 0.6 08/14/2021 1225   EOSABS 0.0 08/14/2021 1225   BASOSABS 0.0 08/14/2021 1225    DIAGNOSTIC IMAGING:  I have independently reviewed the scans and discussed with the patient. No results found.   ASSESSMENT:  1.  Mild thrombocytopenia: -Patient seen at the request of Dr. Gerarda Fraction for thrombocytopenia. -CBC on 06/26/2020 shows platelet count 129 with normal white count and hemoglobin. -CBC on 07/24/2020 shows platelet count 132 with normal white count and hemoglobin. -She was recently hospitalized for recurrent C. difficile infection.  Her platelet count went down to as low as 75 during hospitalization. -Denies any easy bruising.  No prior history of blood transfusions.   2.  Social/family history: -She lives at home.  Daughter lives with her and helps her. -She is able to do her ADLs. -Never smoker.  Sister had leukemia.   PLAN:  1.  Mild thrombocytopenia: - She has mild thrombocytopenia for the last 1 year. - Nutritional deficiency work-up was negative. - Differential diagnosis includes immune mediated thrombocytopenia versus MDS. - Reviewed labs from 08/14/2021 which showed platelet count 118.  Hemoglobin and white count was normal.  LDH was normal.  Folic acid, copper, A35 and methylmalonic acid levels were  normal. - SPEP was positive at 0.2 g. - She reports easy bruising over the arms but denies any bleeding. - Mild thrombocytopenia stable at this time. - Plan to repeat labs in 6 months.  We will also check serum immunofixation and free light chains at next visit.   2.  Atrial fibrillation: - Continue Xarelto.  No bleeding issues.  Orders placed this encounter:  No orders of the defined types were placed in this encounter.    Derek Jack, MD [facility] [phone]   I, Thana Ates, am acting as a scribe for Dr. Derek Jack.  I, Derek Jack MD, have reviewed the above documentation for accuracy and completeness, and I agree with the above.

## 2021-08-21 ENCOUNTER — Inpatient Hospital Stay (HOSPITAL_COMMUNITY): Payer: PPO | Admitting: Hematology

## 2021-08-21 ENCOUNTER — Other Ambulatory Visit: Payer: Self-pay

## 2021-08-21 VITALS — BP 144/69 | HR 59 | Temp 96.9°F | Resp 16 | Wt 132.8 lb

## 2021-08-21 DIAGNOSIS — D485 Neoplasm of uncertain behavior of skin: Secondary | ICD-10-CM | POA: Diagnosis not present

## 2021-08-21 DIAGNOSIS — D696 Thrombocytopenia, unspecified: Secondary | ICD-10-CM

## 2021-08-21 DIAGNOSIS — L570 Actinic keratosis: Secondary | ICD-10-CM | POA: Diagnosis not present

## 2021-08-21 NOTE — Patient Instructions (Signed)
[facility] Discharge Instructions  You were seen and examined today by Dr. Katragadda. Please follow up as scheduled.    Thank you for choosing [facility] at [facility] to provide your oncology and hematology care.  To afford each patient quality time with our provider, please arrive at least 15 minutes before your scheduled appointment time.   If you have a lab appointment with the Cancer Center please come in thru the Main Entrance and check in at the main information desk.  You need to re-schedule your appointment should you arrive 10 or more minutes late.  We strive to give you quality time with our providers, and arriving late affects you and other patients whose appointments are after yours.  Also, if you no show three or more times for appointments you may be dismissed from the clinic at the providers discretion.     Again, thank you for choosing [facility].  Our hope is that these requests will decrease the amount of time that you wait before being seen by our physicians.       _____________________________________________________________  Should you have questions after your visit to [facility], please contact our office at [phone] and follow the prompts.  Our office hours are 8:00 a.m. and 4:30 p.m. Monday - Friday.  Please note that voicemails left after 4:00 p.m. may not be returned until the following business day.  We are closed weekends and major holidays.  You do have access to a nurse 24-7, just call the main number to the clinic [phone] and do not press any options, hold on the line and a nurse will answer the phone.    For prescription refill requests, have your pharmacy contact our office and allow 72 hours.    Due to Covid, you will need to wear a mask upon entering the hospital. If you do not have a mask, a mask will be given to you at the Main Entrance upon arrival. For  doctor visits, patients may have 1 support person age 18 or older with them. For treatment visits, patients can not have anyone with them due to social distancing guidelines and our immunocompromised population.      

## 2021-08-26 DIAGNOSIS — I509 Heart failure, unspecified: Secondary | ICD-10-CM | POA: Diagnosis not present

## 2021-08-26 DIAGNOSIS — R0902 Hypoxemia: Secondary | ICD-10-CM | POA: Diagnosis not present

## 2021-09-20 DIAGNOSIS — I890 Lymphedema, not elsewhere classified: Secondary | ICD-10-CM | POA: Diagnosis not present

## 2021-09-20 DIAGNOSIS — Z7901 Long term (current) use of anticoagulants: Secondary | ICD-10-CM | POA: Diagnosis not present

## 2021-09-20 DIAGNOSIS — R109 Unspecified abdominal pain: Secondary | ICD-10-CM | POA: Diagnosis not present

## 2021-09-20 DIAGNOSIS — D696 Thrombocytopenia, unspecified: Secondary | ICD-10-CM | POA: Diagnosis not present

## 2021-09-20 DIAGNOSIS — D692 Other nonthrombocytopenic purpura: Secondary | ICD-10-CM | POA: Diagnosis not present

## 2021-09-20 DIAGNOSIS — L259 Unspecified contact dermatitis, unspecified cause: Secondary | ICD-10-CM | POA: Diagnosis not present

## 2021-09-20 DIAGNOSIS — I10 Essential (primary) hypertension: Secondary | ICD-10-CM | POA: Diagnosis not present

## 2021-09-20 DIAGNOSIS — R531 Weakness: Secondary | ICD-10-CM | POA: Diagnosis not present

## 2021-09-20 DIAGNOSIS — G9341 Metabolic encephalopathy: Secondary | ICD-10-CM | POA: Diagnosis not present

## 2021-09-20 DIAGNOSIS — I480 Paroxysmal atrial fibrillation: Secondary | ICD-10-CM | POA: Diagnosis not present

## 2021-09-20 DIAGNOSIS — M1990 Unspecified osteoarthritis, unspecified site: Secondary | ICD-10-CM | POA: Diagnosis not present

## 2021-09-20 DIAGNOSIS — I69331 Monoplegia of upper limb following cerebral infarction affecting right dominant side: Secondary | ICD-10-CM | POA: Diagnosis not present

## 2021-09-26 DIAGNOSIS — I509 Heart failure, unspecified: Secondary | ICD-10-CM | POA: Diagnosis not present

## 2021-09-26 DIAGNOSIS — R0902 Hypoxemia: Secondary | ICD-10-CM | POA: Diagnosis not present

## 2021-09-27 DIAGNOSIS — L259 Unspecified contact dermatitis, unspecified cause: Secondary | ICD-10-CM | POA: Diagnosis not present

## 2021-10-20 DIAGNOSIS — G9341 Metabolic encephalopathy: Secondary | ICD-10-CM | POA: Diagnosis not present

## 2021-10-20 DIAGNOSIS — R109 Unspecified abdominal pain: Secondary | ICD-10-CM | POA: Diagnosis not present

## 2021-10-20 DIAGNOSIS — R531 Weakness: Secondary | ICD-10-CM | POA: Diagnosis not present

## 2021-10-26 DIAGNOSIS — R0902 Hypoxemia: Secondary | ICD-10-CM | POA: Diagnosis not present

## 2021-10-26 DIAGNOSIS — I509 Heart failure, unspecified: Secondary | ICD-10-CM | POA: Diagnosis not present

## 2021-11-07 DIAGNOSIS — L989 Disorder of the skin and subcutaneous tissue, unspecified: Secondary | ICD-10-CM | POA: Diagnosis not present

## 2021-11-07 DIAGNOSIS — I10 Essential (primary) hypertension: Secondary | ICD-10-CM | POA: Diagnosis not present

## 2021-11-07 DIAGNOSIS — Z515 Encounter for palliative care: Secondary | ICD-10-CM | POA: Diagnosis not present

## 2021-11-07 DIAGNOSIS — Z7901 Long term (current) use of anticoagulants: Secondary | ICD-10-CM | POA: Diagnosis not present

## 2021-11-07 DIAGNOSIS — Z66 Do not resuscitate: Secondary | ICD-10-CM | POA: Diagnosis not present

## 2021-11-07 DIAGNOSIS — M1990 Unspecified osteoarthritis, unspecified site: Secondary | ICD-10-CM | POA: Diagnosis not present

## 2021-11-07 DIAGNOSIS — D692 Other nonthrombocytopenic purpura: Secondary | ICD-10-CM | POA: Diagnosis not present

## 2021-11-20 DIAGNOSIS — G9341 Metabolic encephalopathy: Secondary | ICD-10-CM | POA: Diagnosis not present

## 2021-11-20 DIAGNOSIS — R109 Unspecified abdominal pain: Secondary | ICD-10-CM | POA: Diagnosis not present

## 2021-11-20 DIAGNOSIS — R531 Weakness: Secondary | ICD-10-CM | POA: Diagnosis not present

## 2021-11-26 DIAGNOSIS — I509 Heart failure, unspecified: Secondary | ICD-10-CM | POA: Diagnosis not present

## 2021-11-26 DIAGNOSIS — R0902 Hypoxemia: Secondary | ICD-10-CM | POA: Diagnosis not present

## 2021-12-10 ENCOUNTER — Other Ambulatory Visit: Payer: Self-pay | Admitting: Cardiology

## 2021-12-14 DIAGNOSIS — M1990 Unspecified osteoarthritis, unspecified site: Secondary | ICD-10-CM | POA: Diagnosis not present

## 2021-12-14 DIAGNOSIS — Z515 Encounter for palliative care: Secondary | ICD-10-CM | POA: Diagnosis not present

## 2021-12-14 DIAGNOSIS — I69331 Monoplegia of upper limb following cerebral infarction affecting right dominant side: Secondary | ICD-10-CM | POA: Diagnosis not present

## 2021-12-14 DIAGNOSIS — Z7901 Long term (current) use of anticoagulants: Secondary | ICD-10-CM | POA: Diagnosis not present

## 2021-12-14 DIAGNOSIS — D692 Other nonthrombocytopenic purpura: Secondary | ICD-10-CM | POA: Diagnosis not present

## 2021-12-14 DIAGNOSIS — I480 Paroxysmal atrial fibrillation: Secondary | ICD-10-CM | POA: Diagnosis not present

## 2021-12-14 DIAGNOSIS — I10 Essential (primary) hypertension: Secondary | ICD-10-CM | POA: Diagnosis not present

## 2021-12-14 DIAGNOSIS — M79644 Pain in right finger(s): Secondary | ICD-10-CM | POA: Diagnosis not present

## 2021-12-14 DIAGNOSIS — D696 Thrombocytopenia, unspecified: Secondary | ICD-10-CM | POA: Diagnosis not present

## 2021-12-27 DIAGNOSIS — R0902 Hypoxemia: Secondary | ICD-10-CM | POA: Diagnosis not present

## 2021-12-27 DIAGNOSIS — I509 Heart failure, unspecified: Secondary | ICD-10-CM | POA: Diagnosis not present

## 2022-01-08 DIAGNOSIS — E782 Mixed hyperlipidemia: Secondary | ICD-10-CM | POA: Diagnosis not present

## 2022-01-08 DIAGNOSIS — I10 Essential (primary) hypertension: Secondary | ICD-10-CM | POA: Diagnosis not present

## 2022-01-08 DIAGNOSIS — I4891 Unspecified atrial fibrillation: Secondary | ICD-10-CM | POA: Diagnosis not present

## 2022-01-24 DIAGNOSIS — R0902 Hypoxemia: Secondary | ICD-10-CM | POA: Diagnosis not present

## 2022-01-24 DIAGNOSIS — I509 Heart failure, unspecified: Secondary | ICD-10-CM | POA: Diagnosis not present

## 2022-02-14 DIAGNOSIS — Z515 Encounter for palliative care: Secondary | ICD-10-CM | POA: Diagnosis not present

## 2022-02-14 DIAGNOSIS — I69331 Monoplegia of upper limb following cerebral infarction affecting right dominant side: Secondary | ICD-10-CM | POA: Diagnosis not present

## 2022-02-14 DIAGNOSIS — I10 Essential (primary) hypertension: Secondary | ICD-10-CM | POA: Diagnosis not present

## 2022-02-14 DIAGNOSIS — Z66 Do not resuscitate: Secondary | ICD-10-CM | POA: Diagnosis not present

## 2022-02-14 DIAGNOSIS — D692 Other nonthrombocytopenic purpura: Secondary | ICD-10-CM | POA: Diagnosis not present

## 2022-02-14 DIAGNOSIS — Z7901 Long term (current) use of anticoagulants: Secondary | ICD-10-CM | POA: Diagnosis not present

## 2022-02-14 DIAGNOSIS — B372 Candidiasis of skin and nail: Secondary | ICD-10-CM | POA: Diagnosis not present

## 2022-02-24 DIAGNOSIS — I509 Heart failure, unspecified: Secondary | ICD-10-CM | POA: Diagnosis not present

## 2022-02-24 DIAGNOSIS — R0902 Hypoxemia: Secondary | ICD-10-CM | POA: Diagnosis not present

## 2022-02-25 ENCOUNTER — Inpatient Hospital Stay (HOSPITAL_COMMUNITY): Payer: PPO

## 2022-02-26 ENCOUNTER — Inpatient Hospital Stay (HOSPITAL_COMMUNITY): Payer: PPO | Attending: Hematology

## 2022-02-26 DIAGNOSIS — Z7901 Long term (current) use of anticoagulants: Secondary | ICD-10-CM | POA: Insufficient documentation

## 2022-02-26 DIAGNOSIS — D472 Monoclonal gammopathy: Secondary | ICD-10-CM | POA: Diagnosis not present

## 2022-02-26 DIAGNOSIS — L982 Febrile neutrophilic dermatosis [Sweet]: Secondary | ICD-10-CM | POA: Diagnosis not present

## 2022-02-26 DIAGNOSIS — I4891 Unspecified atrial fibrillation: Secondary | ICD-10-CM | POA: Insufficient documentation

## 2022-02-26 DIAGNOSIS — D696 Thrombocytopenia, unspecified: Secondary | ICD-10-CM | POA: Diagnosis not present

## 2022-02-26 DIAGNOSIS — Z806 Family history of leukemia: Secondary | ICD-10-CM | POA: Diagnosis not present

## 2022-02-26 LAB — CBC WITH DIFFERENTIAL/PLATELET
Abs Immature Granulocytes: 0.01 10*3/uL (ref 0.00–0.07)
Basophils Absolute: 0 10*3/uL (ref 0.0–0.1)
Basophils Relative: 1 %
Eosinophils Absolute: 0 10*3/uL (ref 0.0–0.5)
Eosinophils Relative: 1 %
HCT: 41.9 % (ref 36.0–46.0)
Hemoglobin: 13.1 g/dL (ref 12.0–15.0)
Immature Granulocytes: 0 %
Lymphocytes Relative: 28 %
Lymphs Abs: 1.5 10*3/uL (ref 0.7–4.0)
MCH: 29 pg (ref 26.0–34.0)
MCHC: 31.3 g/dL (ref 30.0–36.0)
MCV: 92.7 fL (ref 80.0–100.0)
Monocytes Absolute: 0.5 10*3/uL (ref 0.1–1.0)
Monocytes Relative: 9 %
Neutro Abs: 3.3 10*3/uL (ref 1.7–7.7)
Neutrophils Relative %: 61 %
Platelets: 136 10*3/uL — ABNORMAL LOW (ref 150–400)
RBC: 4.52 MIL/uL (ref 3.87–5.11)
RDW: 13.9 % (ref 11.5–15.5)
WBC: 5.4 10*3/uL (ref 4.0–10.5)
nRBC: 0 % (ref 0.0–0.2)

## 2022-02-26 LAB — COMPREHENSIVE METABOLIC PANEL
ALT: 11 U/L (ref 0–44)
AST: 21 U/L (ref 15–41)
Albumin: 4.1 g/dL (ref 3.5–5.0)
Alkaline Phosphatase: 58 U/L (ref 38–126)
Anion gap: 8 (ref 5–15)
BUN: 21 mg/dL (ref 8–23)
CO2: 32 mmol/L (ref 22–32)
Calcium: 9.5 mg/dL (ref 8.9–10.3)
Chloride: 102 mmol/L (ref 98–111)
Creatinine, Ser: 0.86 mg/dL (ref 0.44–1.00)
GFR, Estimated: >60 mL/min (ref >60)
Glucose, Bld: 99 mg/dL (ref 70–99)
Potassium: 4.6 mmol/L (ref 3.5–5.1)
Sodium: 142 mmol/L (ref 135–145)
Total Bilirubin: 1 mg/dL (ref 0.3–1.2)
Total Protein: 6.7 g/dL (ref 6.5–8.1)

## 2022-02-26 LAB — LACTATE DEHYDROGENASE: LDH: 150 U/L (ref 98–192)

## 2022-02-27 LAB — PROTEIN ELECTROPHORESIS, SERUM
A/G Ratio: 1.4 (ref 0.7–1.7)
Albumin ELP: 3.9 g/dL (ref 2.9–4.4)
Alpha-1-Globulin: 0.3 g/dL (ref 0.0–0.4)
Alpha-2-Globulin: 0.7 g/dL (ref 0.4–1.0)
Beta Globulin: 0.9 g/dL (ref 0.7–1.3)
Gamma Globulin: 0.9 g/dL (ref 0.4–1.8)
Globulin, Total: 2.7 g/dL (ref 2.2–3.9)
M-Spike, %: 0.3 g/dL — ABNORMAL HIGH
Total Protein ELP: 6.6 g/dL (ref 6.0–8.5)

## 2022-02-27 LAB — KAPPA/LAMBDA LIGHT CHAINS
Kappa free light chain: 22.1 mg/L — ABNORMAL HIGH (ref 3.3–19.4)
Kappa, lambda light chain ratio: 1.52 (ref 0.26–1.65)
Lambda free light chains: 14.5 mg/L (ref 5.7–26.3)

## 2022-02-28 LAB — IMMUNOFIXATION ELECTROPHORESIS
IgA: 110 mg/dL (ref 64–422)
IgG (Immunoglobin G), Serum: 809 mg/dL (ref 586–1602)
IgM (Immunoglobulin M), Srm: 120 mg/dL (ref 26–217)
Total Protein ELP: 6.5 g/dL (ref 6.0–8.5)

## 2022-03-04 ENCOUNTER — Inpatient Hospital Stay (HOSPITAL_BASED_OUTPATIENT_CLINIC_OR_DEPARTMENT_OTHER): Payer: PPO | Admitting: Hematology

## 2022-03-04 VITALS — BP 145/77 | HR 70 | Temp 97.3°F | Resp 18 | Ht 63.0 in | Wt 136.4 lb

## 2022-03-04 DIAGNOSIS — D696 Thrombocytopenia, unspecified: Secondary | ICD-10-CM | POA: Diagnosis not present

## 2022-03-04 NOTE — Patient Instructions (Signed)
[facility] ?Discharge Instructions ? ?You were seen and examined today by Dr. Delton Coombes. ? ?Dr. Delton Coombes discussed your most recent lab work and everything looks okay. ? ?Please keep follow-up appointments as scheduled in 6 months.  ? ? ?Thank you for choosing [facility] at [facility] to provide your oncology and hematology care.  To afford each patient quality time with our provider, please arrive at least 15 minutes before your scheduled appointment time.  ? ?If you have a lab appointment with the [facility] please come in thru the Main Entrance and check in at the main information desk. ? ?You need to re-schedule your appointment should you arrive 10 or more minutes late.  We strive to give you quality time with our providers, and arriving late affects you and other patients whose appointments are after yours.  Also, if you no show three or more times for appointments you may be dismissed from the clinic at the providers discretion.     ?Again, thank you for choosing [facility].  Our hope is that these requests will decrease the amount of time that you wait before being seen by our physicians.       ?_____________________________________________________________ ? ?Should you have questions after your visit to [facility], please contact our office at [phone] and follow the prompts.  Our office hours are 8:00 a.m. and 4:30 p.m. Monday - Friday.  Please note that voicemails left after 4:00 p.m. may not be returned until the following business day.  We are closed weekends and major holidays.  You do have access to a nurse 24-7, just call the main number to the clinic [phone] and do not press any options, hold on the line and a nurse will answer the phone.   ? ?For prescription refill requests, have your pharmacy contact our office and allow 72 hours.   ? ?Due to Covid, you will need to wear a mask upon entering  the hospital. If you do not have a mask, a mask will be given to you at the Main Entrance upon arrival. For doctor visits, patients may have 1 support person age 92 or older with them. For treatment visits, patients can not have anyone with them due to social distancing guidelines and our immunocompromised population.  ? ?  ?

## 2022-03-04 NOTE — Progress Notes (Signed)
? ?[facility] ?[address] ?[place], [place] [address] ? ? ?CLINIC:  ?Medical Oncology/Hematology ? ?PCP:  ?Redmond School, MD ?[address] / [place] [address]  ?[phone] ? ?REASON FOR VISIT:  ?Follow-up for thrombocytopenia ? ?PRIOR THERAPY: none ? ?CURRENT THERAPY: Xarelto ? ?INTERVAL HISTORY:  ?Kathryn Marquez, a 88 y.o. female, returns for routine follow-up for her thrombocytopenia. Kathryn Marquez was last seen on 08/21/2021. ? ?Today she reports feeling good. She denies new bone pains. 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 She denies recent falls. She walks with assistance of a walker. She denies current bleeding. Her appetite is good.  ? ?REVIEW OF SYSTEMS:  ?Review of Systems  ?Constitutional:  Negative for appetite change and fatigue.  ?HENT:   Positive for trouble swallowing. Negative for nosebleeds.   ?Respiratory:  Negative for hemoptysis.   ?Gastrointestinal:  Negative for blood in stool.  ?Genitourinary:  Negative for hematuria.   ?Skin:  Positive for rash.  ?Hematological:  Does not bruise/bleed easily.  ?All other systems reviewed and are negative. ? ?PAST MEDICAL/SURGICAL HISTORY:  ?Past Medical History:  ?Diagnosis Date  ? Atrial fibrillation ([facility])   ? Essential hypertension   ? History of stroke 04/29/2012  ? Recurrent Clostridioides difficile diarrhea   ? Thrombocytopenia ([facility])   ? TIA (transient ischemic attack)   ? ?Past Surgical History:  ?Procedure Laterality Date  ? No prior surgery    ? ? ?SOCIAL HISTORY:  ?Social History  ? ?Socioeconomic History  ? Marital status: Widowed  ?  Spouse name: Not on file  ? Number of children: Not on file  ? Years of education: Not on file  ? Highest education level: Not on file  ?Occupational History  ? Not on file  ?Tobacco Use  ? Smoking status: Never  ? Smokeless tobacco: Never  ?Vaping Use  ? Vaping Use: Never used  ?Substance and Sexual Activity  ? Alcohol use: No  ? Drug use: No  ? Sexual activity: Not on file  ?Other  Topics Concern  ? Not on file  ?Social History Narrative  ? Not on file  ? ?Social Determinants of Health  ? ?Financial Resource Strain: Not on file  ?Food Insecurity: Not on file  ?Transportation Needs: Not on file  ?Physical Activity: Not on file  ?Stress: Not on file  ?Social Connections: Not on file  ?Intimate Partner Violence: Not on file  ? ? ?FAMILY HISTORY:  ?Family History  ?Problem Relation Age of Onset  ? Stroke Other   ? Cancer Other   ? Heart attack Mother   ? Fibromyalgia Mother   ? Hypercholesterolemia Mother   ? Leukemia Sister   ? Cancer Brother   ? Stroke Father   ? Heart attack Brother   ? Diabetes Son   ? Fibromyalgia Son   ? Hypertension Son   ? ? ?CURRENT MEDICATIONS:  ?Current Outpatient Medications  ?Medication Sig Dispense Refill  ? ascorbic acid (VITAMIN C) 500 MG tablet Take 500 mg by mouth daily.    ? calcium-vitamin D (OSCAL WITH D) 500-200 MG-UNIT tablet Take 1 tablet by mouth daily with breakfast.    ? cetirizine (ZYRTEC) 10 MG tablet Take 10 mg by mouth in the morning.     ? Cyanocobalamin (B-12 PO) Take 1 tablet by mouth daily.    ? furosemide (LASIX) 40 MG tablet TAKE 1 TABLET BY MOUTH OCNE A DAY. 90 tablet 0  ? guaiFENesin-dextromethorphan (ROBITUSSIN DM) 100-10 MG/5ML  syrup Take 10 mLs by mouth every 4 (four) hours as needed for cough. (Patient not taking: Reported on 08/21/2021) 118 mL 0  ? metoprolol succinate (TOPROL-XL) 25 MG 24 hr tablet Take 1 tablet (25 mg total) by mouth daily. New blood pressure medicine. 30 tablet 2  ? Multiple Vitamins-Minerals (VITAMIN D3 COMPLETE PO) Take 1 tablet by mouth daily.    ? Omeprazole 20 MG TBEC Take 1 tablet (20 mg total) by mouth at bedtime. 30 tablet 0  ? potassium chloride (KLOR-CON) 10 MEQ tablet TAKE 1 TABLET BY MOUTH ONCE A DAY. 90 tablet 0  ? predniSONE (DELTASONE) 10 MG tablet Take 3 tabs po QAM x 3 days, then 2 po QAM x 3 days, then 1 po QAM x 3 days then stop 18 tablet 0  ? saccharomyces boulardii (FLORASTOR) 250 MG capsule Take  250 mg by mouth 2 (two) times daily.    ? XARELTO 15 MG TABS tablet Take 15 mg by mouth in the morning.     ? zinc sulfate 220 (50 Zn) MG capsule Take 1 capsule (220 mg total) by mouth daily. 30 capsule 0  ? ?No current facility-administered medications for this visit.  ? ? ?ALLERGIES:  ?No Known Allergies ? ?PHYSICAL EXAM:  ?Performance status (ECOG): 2 - Symptomatic, <50% confined to bed ? ?There were no vitals filed for this visit. ?Wt Readings from Last 3 Encounters:  ?08/21/21 132 lb 12.8 oz (60.2 kg)  ?06/26/21 123 lb 9.6 oz (56.1 kg)  ?05/30/21 127 lb 10.3 oz (57.9 kg)  ? ?Physical Exam ?Vitals reviewed.  ?Constitutional:   ?   Appearance: Normal appearance.  ?   Comments: In wheelchair  ?Cardiovascular:  ?   Rate and Rhythm: Normal rate and regular rhythm.  ?   Pulses: Normal pulses.  ?   Heart sounds: Normal heart sounds.  ?Pulmonary:  ?   Effort: Pulmonary effort is normal.  ?   Breath sounds: Normal breath sounds.  ?Neurological:  ?   General: No focal deficit present.  ?   Mental Status: She is alert and oriented to person, place, and time.  ?Psychiatric:     ?   Mood and Affect: Mood normal.     ?   Behavior: Behavior normal.  ? ? ?LABORATORY DATA:  ?I have reviewed the labs as listed.  ? ?  Latest Ref Rng & Units 02/26/2022  ? 10:56 AM 08/14/2021  ? 12:25 PM 05/30/2021  ?  4:53 AM  ?CBC  ?WBC 4.0 - 10.5 K/uL 5.4   5.1   9.2    ?Hemoglobin 12.0 - 15.0 g/dL 13.1   12.2   13.6    ?Hematocrit 36.0 - 46.0 % 41.9   39.8   41.5    ?Platelets 150 - 400 K/uL 136   118   184    ? ? ?  Latest Ref Rng & Units 02/26/2022  ? 10:56 AM 05/30/2021  ?  4:53 AM 05/29/2021  ?  3:56 AM  ?CMP  ?Glucose 70 - 99 mg/dL 99   156   148    ?BUN 8 - 23 mg/dL 21   38   31    ?Creatinine 0.44 - 1.00 mg/dL 0.86   0.63   0.61    ?Sodium 135 - 145 mmol/L 142   134   136    ?Potassium 3.5 - 5.1 mmol/L 4.6   3.7   4.0    ?Chloride 98 - 111 mmol/L  102   92   92    ?CO2 22 - 32 mmol/L 32   34   34    ?Calcium 8.9 - 10.3 mg/dL 9.5   8.5   8.7     ?Total Protein 6.5 - 8.1 g/dL 6.7   5.4   6.0    ?Total Bilirubin 0.3 - 1.2 mg/dL 1.0   0.8   0.9    ?Alkaline Phos 38 - 126 U/L 58   44   48    ?AST 15 - 41 U/L '21   19   21    '$ ?ALT 0 - 44 U/L '11   14   14    '$ ? ?   ?Component Value Date/Time  ? RBC 4.52 02/26/2022 1056  ? MCV 92.7 02/26/2022 1056  ? MCH 29.0 02/26/2022 1056  ? MCHC 31.3 02/26/2022 1056  ? RDW 13.9 02/26/2022 1056  ? LYMPHSABS 1.5 02/26/2022 1056  ? MONOABS 0.5 02/26/2022 1056  ? EOSABS 0.0 02/26/2022 1056  ? BASOSABS 0.0 02/26/2022 1056  ? ? ?DIAGNOSTIC IMAGING:  ?I have independently reviewed the scans and discussed with the patient. ?No results found.  ? ?ASSESSMENT:  ?1.  Mild thrombocytopenia: ?-Patient seen at the request of Dr. Gerarda Fraction for thrombocytopenia. ?-CBC on 06/26/2020 shows platelet count 129 with normal white count and hemoglobin. ?-CBC on 07/24/2020 shows platelet count 132 with normal white count and hemoglobin. ?-She was recently hospitalized for recurrent C. difficile infection.  Her platelet count went down to as low as 75 during hospitalization. ?-Denies any easy bruising.  No prior history of blood transfusions. ?  ?2.  Social/family history: ?-She lives at home.  Daughter lives with her and helps her. ?-She is able to do her ADLs. ?-Never smoker.  Sister had leukemia. ? ? ?PLAN:  ?1.  Mild thrombocytopenia: ?-She has mild thrombocytopenia for more than a year.  Nutritional deficiency work-up was negative. ?- Differential includes ITP versus MDS. ?- No bleeding issues reported.  Most recent CBC on 02/26/2022 shows platelet count 136. ?- She recently had a rash on the upper trunk, treated by Dr. Gerarda Fraction for Sweet syndrome with prednisone and topical triamcinolone cream.  Rash is improving. ? ?2.  IgM kappa MGUS: ?- Myeloma labs from 02/26/2022 with M spike 0.3 g.  Kappa light chains are elevated at 22.1, ratio of 1.52.  Immunofixation shows IgM kappa. ?- Hemoglobin is 13.1, calcium 9.5, creatinine 0.86. ?- No "crab" features.   Recommend follow-up in 6 months with repeat SPEP, free light chains, CBC and CMP. ?  ?3.  Atrial fibrillation: ?-Continue Xarelto.  No bleeding issues. ? ?Orders placed this encounter:  ?No orders of the defin

## 2022-03-26 DIAGNOSIS — R0902 Hypoxemia: Secondary | ICD-10-CM | POA: Diagnosis not present

## 2022-03-26 DIAGNOSIS — I509 Heart failure, unspecified: Secondary | ICD-10-CM | POA: Diagnosis not present

## 2022-04-26 DIAGNOSIS — R0902 Hypoxemia: Secondary | ICD-10-CM | POA: Diagnosis not present

## 2022-04-26 DIAGNOSIS — I509 Heart failure, unspecified: Secondary | ICD-10-CM | POA: Diagnosis not present

## 2022-05-26 DIAGNOSIS — I509 Heart failure, unspecified: Secondary | ICD-10-CM | POA: Diagnosis not present

## 2022-05-26 DIAGNOSIS — R0902 Hypoxemia: Secondary | ICD-10-CM | POA: Diagnosis not present

## 2022-06-10 ENCOUNTER — Other Ambulatory Visit: Payer: Self-pay | Admitting: Cardiology

## 2022-06-20 ENCOUNTER — Other Ambulatory Visit: Payer: Self-pay | Admitting: Cardiology

## 2022-06-26 DIAGNOSIS — R0902 Hypoxemia: Secondary | ICD-10-CM | POA: Diagnosis not present

## 2022-06-26 DIAGNOSIS — I509 Heart failure, unspecified: Secondary | ICD-10-CM | POA: Diagnosis not present

## 2022-07-23 DIAGNOSIS — M549 Dorsalgia, unspecified: Secondary | ICD-10-CM | POA: Diagnosis not present

## 2022-07-23 DIAGNOSIS — Z6830 Body mass index (BMI) 30.0-30.9, adult: Secondary | ICD-10-CM | POA: Diagnosis not present

## 2022-07-23 DIAGNOSIS — N390 Urinary tract infection, site not specified: Secondary | ICD-10-CM | POA: Diagnosis not present

## 2022-07-23 DIAGNOSIS — E6609 Other obesity due to excess calories: Secondary | ICD-10-CM | POA: Diagnosis not present

## 2022-07-27 DIAGNOSIS — R0902 Hypoxemia: Secondary | ICD-10-CM | POA: Diagnosis not present

## 2022-07-27 DIAGNOSIS — I509 Heart failure, unspecified: Secondary | ICD-10-CM | POA: Diagnosis not present

## 2022-08-06 DIAGNOSIS — N390 Urinary tract infection, site not specified: Secondary | ICD-10-CM | POA: Diagnosis not present

## 2022-08-19 DIAGNOSIS — M5137 Other intervertebral disc degeneration, lumbosacral region: Secondary | ICD-10-CM | POA: Diagnosis not present

## 2022-08-19 DIAGNOSIS — Z6830 Body mass index (BMI) 30.0-30.9, adult: Secondary | ICD-10-CM | POA: Diagnosis not present

## 2022-08-19 DIAGNOSIS — M5136 Other intervertebral disc degeneration, lumbar region: Secondary | ICD-10-CM | POA: Diagnosis not present

## 2022-08-19 DIAGNOSIS — N390 Urinary tract infection, site not specified: Secondary | ICD-10-CM | POA: Diagnosis not present

## 2022-08-19 DIAGNOSIS — M5134 Other intervertebral disc degeneration, thoracic region: Secondary | ICD-10-CM | POA: Diagnosis not present

## 2022-08-19 DIAGNOSIS — E6609 Other obesity due to excess calories: Secondary | ICD-10-CM | POA: Diagnosis not present

## 2022-08-26 DIAGNOSIS — R0902 Hypoxemia: Secondary | ICD-10-CM | POA: Diagnosis not present

## 2022-08-26 DIAGNOSIS — I509 Heart failure, unspecified: Secondary | ICD-10-CM | POA: Diagnosis not present

## 2022-08-27 ENCOUNTER — Inpatient Hospital Stay: Payer: PPO | Attending: Hematology

## 2022-08-27 DIAGNOSIS — D696 Thrombocytopenia, unspecified: Secondary | ICD-10-CM | POA: Diagnosis not present

## 2022-08-27 DIAGNOSIS — I4891 Unspecified atrial fibrillation: Secondary | ICD-10-CM | POA: Diagnosis not present

## 2022-08-27 DIAGNOSIS — D472 Monoclonal gammopathy: Secondary | ICD-10-CM | POA: Diagnosis not present

## 2022-08-27 DIAGNOSIS — Z7901 Long term (current) use of anticoagulants: Secondary | ICD-10-CM | POA: Insufficient documentation

## 2022-08-27 DIAGNOSIS — M549 Dorsalgia, unspecified: Secondary | ICD-10-CM | POA: Diagnosis not present

## 2022-08-27 DIAGNOSIS — G8929 Other chronic pain: Secondary | ICD-10-CM | POA: Insufficient documentation

## 2022-08-27 DIAGNOSIS — Z79899 Other long term (current) drug therapy: Secondary | ICD-10-CM | POA: Insufficient documentation

## 2022-08-27 LAB — COMPREHENSIVE METABOLIC PANEL
ALT: 13 U/L (ref 0–44)
AST: 20 U/L (ref 15–41)
Albumin: 3.8 g/dL (ref 3.5–5.0)
Alkaline Phosphatase: 83 U/L (ref 38–126)
Anion gap: 9 (ref 5–15)
BUN: 15 mg/dL (ref 8–23)
CO2: 29 mmol/L (ref 22–32)
Calcium: 9 mg/dL (ref 8.9–10.3)
Chloride: 98 mmol/L (ref 98–111)
Creatinine, Ser: 0.78 mg/dL (ref 0.44–1.00)
GFR, Estimated: >60 mL/min (ref >60)
Glucose, Bld: 109 mg/dL — ABNORMAL HIGH (ref 70–99)
Potassium: 3.7 mmol/L (ref 3.5–5.1)
Sodium: 136 mmol/L (ref 135–145)
Total Bilirubin: 1.2 mg/dL (ref 0.3–1.2)
Total Protein: 6.7 g/dL (ref 6.5–8.1)

## 2022-08-27 LAB — CBC WITH DIFFERENTIAL/PLATELET
Abs Immature Granulocytes: 0.02 10*3/uL (ref 0.00–0.07)
Basophils Absolute: 0 10*3/uL (ref 0.0–0.1)
Basophils Relative: 1 %
Eosinophils Absolute: 0.1 10*3/uL (ref 0.0–0.5)
Eosinophils Relative: 1 %
HCT: 42.5 % (ref 36.0–46.0)
Hemoglobin: 14 g/dL (ref 12.0–15.0)
Immature Granulocytes: 0 %
Lymphocytes Relative: 25 %
Lymphs Abs: 1.9 10*3/uL (ref 0.7–4.0)
MCH: 29.7 pg (ref 26.0–34.0)
MCHC: 32.9 g/dL (ref 30.0–36.0)
MCV: 90.2 fL (ref 80.0–100.0)
Monocytes Absolute: 0.8 10*3/uL (ref 0.1–1.0)
Monocytes Relative: 10 %
Neutro Abs: 4.8 10*3/uL (ref 1.7–7.7)
Neutrophils Relative %: 63 %
Platelets: 164 10*3/uL (ref 150–400)
RBC: 4.71 MIL/uL (ref 3.87–5.11)
RDW: 13.8 % (ref 11.5–15.5)
WBC: 7.6 10*3/uL (ref 4.0–10.5)
nRBC: 0 % (ref 0.0–0.2)

## 2022-08-29 DIAGNOSIS — D692 Other nonthrombocytopenic purpura: Secondary | ICD-10-CM | POA: Diagnosis not present

## 2022-08-29 DIAGNOSIS — Z515 Encounter for palliative care: Secondary | ICD-10-CM | POA: Diagnosis not present

## 2022-08-29 DIAGNOSIS — M1990 Unspecified osteoarthritis, unspecified site: Secondary | ICD-10-CM | POA: Diagnosis not present

## 2022-08-29 LAB — PROTEIN ELECTROPHORESIS, SERUM
A/G Ratio: 1.7 (ref 0.7–1.7)
Albumin ELP: 3.8 g/dL (ref 2.9–4.4)
Alpha-1-Globulin: 0.3 g/dL (ref 0.0–0.4)
Alpha-2-Globulin: 0.7 g/dL (ref 0.4–1.0)
Beta Globulin: 0.7 g/dL (ref 0.7–1.3)
Gamma Globulin: 0.5 g/dL (ref 0.4–1.8)
Globulin, Total: 2.2 g/dL (ref 2.2–3.9)
Total Protein ELP: 6 g/dL (ref 6.0–8.5)

## 2022-08-29 LAB — KAPPA/LAMBDA LIGHT CHAINS
Kappa free light chain: 18 mg/L (ref 3.3–19.4)
Kappa, lambda light chain ratio: 1.54 (ref 0.26–1.65)
Lambda free light chains: 11.7 mg/L (ref 5.7–26.3)

## 2022-09-03 ENCOUNTER — Inpatient Hospital Stay: Payer: PPO | Admitting: Hematology

## 2022-09-03 VITALS — BP 136/77 | HR 101 | Temp 97.0°F | Resp 19 | Ht 64.0 in | Wt 138.2 lb

## 2022-09-03 DIAGNOSIS — D696 Thrombocytopenia, unspecified: Secondary | ICD-10-CM

## 2022-09-03 NOTE — Patient Instructions (Signed)
[facility]  Discharge Instructions  You were seen and examined today by Dr. Delton Coombes.  Dr. Delton Coombes discussed your most recent lab work which revealed that your labs look good.  Follow-up as scheduled in 6 months.    Thank you for choosing [facility] to provide your oncology and hematology care.   To afford each patient quality time with our provider, please arrive at least 15 minutes before your scheduled appointment time. You may need to reschedule your appointment if you arrive late (10 or more minutes). Arriving late affects you and other patients whose appointments are after yours.  Also, if you miss three or more appointments without notifying the office, you may be dismissed from the clinic at the provider's discretion.    Again, thank you for choosing [facility].  Our hope is that these requests will decrease the amount of time that you wait before being seen by our physicians.   If you have a lab appointment with the [facility] please come in thru the Main Entrance and check in at the main information desk.           _____________________________________________________________  Should you have questions after your visit to [facility], please contact our office at [phone] and follow the prompts.  Our office hours are 8:00 a.m. to 4:30 p.m. Monday - Thursday and 8:00 a.m. to 2:30 p.m. Friday.  Please note that voicemails left after 4:00 p.m. may not be returned until the following business day.  We are closed weekends and all major holidays.  You do have access to a nurse 24-7, just call the main number to the clinic [phone] and do not press any options, hold on the line and a nurse will answer the phone.    For prescription refill requests, have your pharmacy contact our office and allow 72 hours.    Masks are optional in the cancer centers. If you would like for your care team  to wear a mask while they are taking care of you, please let them know. You may have one support person who is at least 86 years old accompany you for your appointments.

## 2022-09-03 NOTE — Progress Notes (Signed)
[facility] [place], [place] [address]   CLINIC:  Medical Oncology/Hematology  PCP:  Redmond School, [place] / [place] [address]  [phone]  REASON FOR VISIT:  Follow-up for thrombocytopenia  PRIOR THERAPY: none  CURRENT THERAPY: Observation  INTERVAL HISTORY:  Kathryn Marquez, a 88 y.o. female, seen for follow-up of thrombocytopenia and MGUS.  Denies any new onset pains.  Chronic back pain, 6 out of 10 is stable.  Denies any easy bruising or bleeding.  REVIEW OF SYSTEMS:  Review of Systems  Constitutional:  Negative for appetite change and fatigue.  HENT:   Negative for nosebleeds.   Respiratory:  Negative for hemoptysis.   Gastrointestinal:  Negative for blood in stool.  Genitourinary:  Negative for hematuria.   Musculoskeletal:  Positive for back pain.  Hematological:  Does not bruise/bleed easily.  All other systems reviewed and are negative.   PAST MEDICAL/SURGICAL HISTORY:  Past Medical History:  Diagnosis Date   Atrial fibrillation [facility])    Essential hypertension    History of stroke 04/29/2012   Recurrent Clostridioides difficile diarrhea    Thrombocytopenia ([facility])    TIA (transient ischemic attack)    Past Surgical History:  Procedure Laterality Date   No prior surgery      SOCIAL HISTORY:  Social History   Socioeconomic History   Marital status: Widowed    Spouse name: Not on file   Number of children: Not on file   Years of education: Not on file   Highest education level: Not on file  Occupational History   Not on file  Tobacco Use   Smoking status: Never   Smokeless tobacco: Never  Vaping Use   Vaping Use: Never used  Substance and Sexual Activity   Alcohol use: No   Drug use: No   Sexual activity: Not on file  Other Topics Concern   Not on file  Social History Narrative   Not on file   Social Determinants of Health   Financial Resource Strain: Not on file  Food Insecurity:  Not on file  Transportation Needs: Not on file  Physical Activity: Not on file  Stress: Not on file  Social Connections: Not on file  Intimate Partner Violence: Not on file    FAMILY HISTORY:  Family History  Problem Relation Age of Onset   Stroke Other    Cancer Other    Heart attack Mother    Fibromyalgia Mother    Hypercholesterolemia Mother    Leukemia Sister    Cancer Brother    Stroke Father    Heart attack Brother    Diabetes Son    Fibromyalgia Son    Hypertension Son     CURRENT MEDICATIONS:  Current Outpatient Medications  Medication Sig Dispense Refill   ascorbic acid (VITAMIN C) 500 MG tablet Take 500 mg by mouth daily.     calcium-vitamin D (OSCAL WITH D) 500-200 MG-UNIT tablet Take 1 tablet by mouth daily with breakfast.     cetirizine (ZYRTEC) 10 MG tablet Take 10 mg by mouth in the morning.      ciprofloxacin (CIPRO) 500 MG tablet Take 500 mg by mouth 2 (two) times daily.     Cyanocobalamin (B-12 PO) Take 1 tablet by mouth daily.     furosemide (LASIX) 40 MG tablet TAKE 1 TABLET BY MOUTH ONCE A DAY. 90 tablet 0   guaiFENesin-dextromethorphan (ROBITUSSIN DM) 100-10 MG/5ML syrup Take 10 mLs by  mouth every 4 (four) hours as needed for cough. 118 mL 0   Multiple Vitamins-Minerals (VITAMIN D3 COMPLETE PO) Take 1 tablet by mouth daily.     Omeprazole 20 MG TBEC Take 1 tablet (20 mg total) by mouth at bedtime. 30 tablet 0   potassium chloride (KLOR-CON) 10 MEQ tablet TAKE 1 TABLET BY MOUTH ONCE A DAY. 90 tablet 1   predniSONE (DELTASONE) 10 MG tablet Take 3 tabs po QAM x 3 days, then 2 po QAM x 3 days, then 1 po QAM x 3 days then stop 18 tablet 0   saccharomyces boulardii (FLORASTOR) 250 MG capsule Take 250 mg by mouth 2 (two) times daily.     traMADol (ULTRAM) 50 MG tablet Take 50 mg by mouth every 6 (six) hours as needed.     triamcinolone cream (KENALOG) 0.1 % Apply topically 2 (two) times daily.     XARELTO 15 MG TABS tablet Take 15 mg by mouth in the morning.       zinc sulfate 220 (50 Zn) MG capsule Take 1 capsule (220 mg total) by mouth daily. 30 capsule 0   No current facility-administered medications for this visit.    ALLERGIES:  No Known Allergies  PHYSICAL EXAM:  Performance status (ECOG): 2 - Symptomatic, <50% confined to bed  Vitals:   09/03/22 1505  BP: 136/77  Pulse: (!) 101  Resp: 19  Temp: (!) 97 F (36.1 C)  SpO2: (!) 88%   Wt Readings from Last 3 Encounters:  09/03/22 138 lb 3.2 oz (62.7 kg)  03/04/22 136 lb 6.4 oz (61.9 kg)  08/21/21 132 lb 12.8 oz (60.2 kg)   Physical Exam Vitals reviewed.  Constitutional:      Appearance: Normal appearance.     Comments: In wheelchair  Cardiovascular:     Rate and Rhythm: Normal rate and regular rhythm.     Pulses: Normal pulses.     Heart sounds: Normal heart sounds.  Pulmonary:     Effort: Pulmonary effort is normal.     Breath sounds: Normal breath sounds.  Neurological:     General: No focal deficit present.     Mental Status: She is alert and oriented to person, place, and time.  Psychiatric:        Mood and Affect: Mood normal.        Behavior: Behavior normal.     LABORATORY DATA:  I have reviewed the labs as listed.     Latest Ref Rng & Units 08/27/2022    1:43 PM 02/26/2022   10:56 AM 08/14/2021   12:25 PM  CBC  WBC 4.0 - 10.5 K/uL 7.6  5.4  5.1   Hemoglobin 12.0 - 15.0 g/dL 14.0  13.1  12.2   Hematocrit 36.0 - 46.0 % 42.5  41.9  39.8   Platelets 150 - 400 K/uL 164  136  118       Latest Ref Rng & Units 08/27/2022    1:43 PM 02/26/2022   10:56 AM 05/30/2021    4:53 AM  CMP  Glucose 70 - 99 mg/dL 109  99  156   BUN 8 - 23 mg/dL 15  21  38   Creatinine 0.44 - 1.00 mg/dL 0.78  0.86  0.63   Sodium 135 - 145 mmol/L 136  142  134   Potassium 3.5 - 5.1 mmol/L 3.7  4.6  3.7   Chloride 98 - 111 mmol/L 98  102  92   CO2  22 - 32 mmol/L 29  32  34   Calcium 8.9 - 10.3 mg/dL 9.0  9.5  8.5   Total Protein 6.5 - 8.1 g/dL 6.7  6.7  5.4   Total Bilirubin 0.3 -  1.2 mg/dL 1.2  1.0  0.8   Alkaline Phos 38 - 126 U/L 83  58  44   AST 15 - 41 U/L '20  21  19   '$ ALT 0 - 44 U/L '13  11  14       '$ Component Value Date/Time   RBC 4.71 08/27/2022 1343   MCV 90.2 08/27/2022 1343   MCH 29.7 08/27/2022 1343   MCHC 32.9 08/27/2022 1343   RDW 13.8 08/27/2022 1343   LYMPHSABS 1.9 08/27/2022 1343   MONOABS 0.8 08/27/2022 1343   EOSABS 0.1 08/27/2022 1343   BASOSABS 0.0 08/27/2022 1343    DIAGNOSTIC IMAGING:  I have independently reviewed the scans and discussed with the patient. No results found.   ASSESSMENT:  1.  Mild thrombocytopenia: -Patient seen at the request of Dr. Gerarda Fraction for thrombocytopenia. -CBC on 06/26/2020 shows platelet count 129 with normal white count and hemoglobin. -CBC on 07/24/2020 shows platelet count 132 with normal white count and hemoglobin. -She was recently hospitalized for recurrent C. difficile infection.  Her platelet count went down to as low as 75 during hospitalization. -Denies any easy bruising.  No prior history of blood transfusions.   2.  Social/family history: -She lives at home.  Daughter lives with her and helps her. -She is able to do her ADLs. -Never smoker.  Sister had leukemia.   PLAN:  1.  Mild thrombocytopenia: - She has mild intermittent thrombocytopenia for more than a year.  Nutritional deficiency work-up was negative. - Differential diagnosis includes ITP versus MDS. - CBC on 08/27/2022 showed platelet count normalized to 164. - We will recheck in 6 months.  2.  IgM kappa MGUS: - We reviewed myeloma labs from 08/27/2022.  M spike is not observed.  Previously it was 0.3 g.  Free light chain ratio is 1.54, kappa light chains normalized at 18. - Hemoglobin is 14.  Creatinine is 0.78 and calcium 9.  No "crab" features. - She reported some back pain from spasms getting better.  This pain started with bladder infection and she was treated with Cipro. - RTC follow-up in 6 months with repeat labs.   3.   Atrial fibrillation: - Continue Xarelto.  No bleeding issues.  Orders placed this encounter:  Orders Placed This Encounter  Procedures   CBC with Differential/Platelet   Comprehensive metabolic panel   Kappa/lambda light chains   Protein electrophoresis, serum      Derek Jack, MD [facility] [phone]   I, Kathryn Marquez, am acting as a scribe for Dr. Derek Jack.  I, Derek Jack MD, have reviewed the above documentation for accuracy and completeness, and I agree with the above.

## 2022-09-26 ENCOUNTER — Other Ambulatory Visit: Payer: Self-pay | Admitting: Cardiology

## 2022-09-26 DIAGNOSIS — R0902 Hypoxemia: Secondary | ICD-10-CM | POA: Diagnosis not present

## 2022-09-26 DIAGNOSIS — I509 Heart failure, unspecified: Secondary | ICD-10-CM | POA: Diagnosis not present

## 2022-10-08 NOTE — Progress Notes (Unsigned)
Cardiology Office Note  Date: 10/09/2022   ID: Kathryn Marquez, DOB 1921/07/11, MRN [id number]  PCP:  Redmond School, MD  Cardiologist:  Rozann Lesches, MD Electrophysiologist:  None   Chief Complaint  Patient presents with   Cardiac follow-up    History of Present Illness: Kathryn Marquez is a 88 y.o. female last seen in August 2022.  She is here for a routine visit with her daughter.  Overall doing reasonably well, does not report any sense of palpitations or shortness of breath at rest.  States leg swelling has been adequately controlled on current dose of Lasix.  She does have easy bruising, but does not report any major spontaneous bleeding problems, no changes in stools.  She had a visit with Dr. Delton Coombes recently for follow-up of thrombocytopenia and MGUS, I reviewed the note.  I also reviewed her recent lab work.  I personally reviewed her ECG today which shows rate controlled atrial fibrillation with left posterior fascicular block and nonspecific ST-T changes.  Past Medical History:  Diagnosis Date   Atrial fibrillation [facility])    Essential hypertension    History of stroke 04/29/2012   Recurrent Clostridioides difficile diarrhea    Thrombocytopenia (HCC)    TIA (transient ischemic attack)     Past Surgical History:  Procedure Laterality Date   No prior surgery      Current Outpatient Medications  Medication Sig Dispense Refill   ascorbic acid (VITAMIN C) 500 MG tablet Take 500 mg by mouth daily.     calcium-vitamin D (OSCAL WITH D) 500-200 MG-UNIT tablet Take 1 tablet by mouth daily with breakfast.     cetirizine (ZYRTEC) 10 MG tablet Take 10 mg by mouth in the morning.      ciprofloxacin (CIPRO) 500 MG tablet Take 500 mg by mouth 2 (two) times daily.     Cyanocobalamin (B-12 PO) Take 1 tablet by mouth daily.     furosemide (LASIX) 40 MG tablet TAKE 1 TABLET BY MOUTH ONCE A DAY. 90 tablet 0   guaiFENesin-dextromethorphan (ROBITUSSIN DM) 100-10 MG/5ML syrup  Take 10 mLs by mouth every 4 (four) hours as needed for cough. 118 mL 0   Multiple Vitamins-Minerals (VITAMIN D3 COMPLETE PO) Take 1 tablet by mouth daily.     Omeprazole 20 MG TBEC Take 1 tablet (20 mg total) by mouth at bedtime. 30 tablet 0   potassium chloride (KLOR-CON) 10 MEQ tablet TAKE 1 TABLET BY MOUTH ONCE A DAY. 90 tablet 1   predniSONE (DELTASONE) 10 MG tablet Take 3 tabs po QAM x 3 days, then 2 po QAM x 3 days, then 1 po QAM x 3 days then stop 18 tablet 0   saccharomyces boulardii (FLORASTOR) 250 MG capsule Take 250 mg by mouth 2 (two) times daily.     traMADol (ULTRAM) 50 MG tablet Take 50 mg by mouth every 6 (six) hours as needed.     triamcinolone cream (KENALOG) 0.1 % Apply topically 2 (two) times daily.     XARELTO 15 MG TABS tablet Take 15 mg by mouth in the morning.      zinc sulfate 220 (50 Zn) MG capsule Take 1 capsule (220 mg total) by mouth daily. 30 capsule 0   No current facility-administered medications for this visit.   Allergies:  Patient has no known allergies.   ROS: In wheelchair today.  No orthopnea or PND.  No syncope.  Physical Exam: VS:  BP 132/84   Pulse 74  Ht '5\' 3"'$  (1.6 m)   Wt 134 lb 9.6 oz (61.1 kg)   SpO2 92%   BMI 23.84 kg/m , BMI Body mass index is 23.84 kg/m.  Wt Readings from Last 3 Encounters:  10/09/22 134 lb 9.6 oz (61.1 kg)  09/03/22 138 lb 3.2 oz (62.7 kg)  03/04/22 136 lb 6.4 oz (61.9 kg)    General: Patient appears comfortable at rest. HEENT: Conjunctiva and lids normal. Neck: Supple, no elevated JVP or carotid bruits. Lungs: Clear to auscultation, nonlabored breathing at rest. Cardiac: Irregularly irregular, 1/6 systolic murmur.. Extremities: Mild lymphedema.  ECG:  An ECG dated 05/26/2021 was personally reviewed today and demonstrated:  Atrial fibrillation with rightward axis and nonspecific T wave changes.  Recent Labwork: 08/27/2022: ALT 13; AST 20; BUN 15; Creatinine, Ser 0.78; Hemoglobin 14.0; Platelets 164; Potassium  3.7; Sodium 136   Other Studies Reviewed Today:  Echocardiogram 05/27/2021:  1. Left ventricular ejection fraction, by estimation, is 55 to 60%. The  left ventricle has normal function. Left ventricular diastolic function  could not be evaluated. There is the interventricular septum is flattened  in systole and diastole, consistent   with right ventricular pressure and volume overload.   2. Right ventricular systolic function is mildly reduced. The right  ventricular size is severely enlarged. There is mildly elevated pulmonary  artery systolic pressure. The estimated right ventricular systolic  pressure is 94.7 mmHg.   3. Left atrial size was severely dilated.   4. Right atrial size was severely dilated.   5. A small pericardial effusion is present. The pericardial effusion is  circumferential.   6. The mitral valve is grossly normal. Mild mitral valve regurgitation.  No evidence of mitral stenosis.   7. The tricuspid valve is abnormal. Tricuspid valve regurgitation is  moderate to severe.   8. The aortic valve is tricuspid. Aortic valve regurgitation is not  visualized. No aortic stenosis is present.   9. The inferior vena cava is dilated in size with >50% respiratory  variability, suggesting right atrial pressure of 8 mmHg.  Assessment and Plan:  1.  Permanent atrial fibrillation with CHA2DS2-VASc score of 7.  She is on Xarelto 15 mg daily, lab work reviewed, creatinine clearance 38.  Heart rate normal at rest, currently not on any AV nodal blockers.  2.  Leg edema/lymphedema.  Adequately controlled on Lasix with potassium supplement.  3.  Essential hypertension by history, blood pressure reasonable today.  No changes were made.  Medication Adjustments/Labs and Tests Ordered: Current medicines are reviewed at length with the patient today.  Concerns regarding medicines are outlined above.   Tests Ordered: Orders Placed This Encounter  Procedures   EKG 12-Lead     Medication Changes: No orders of the defined types were placed in this encounter.   Disposition:  Follow up  6 months.  Signed, Satira Sark, MD, [facility] 10/09/2022 11:35 AM    [facility] at [facility] [address] [place], [place] [address] Phone: [phone]; Fax: [phone]

## 2022-10-09 ENCOUNTER — Encounter: Payer: Self-pay | Admitting: Cardiology

## 2022-10-09 ENCOUNTER — Ambulatory Visit: Payer: PPO | Attending: Cardiology | Admitting: Cardiology

## 2022-10-09 VITALS — BP 132/84 | HR 74 | Ht 63.0 in | Wt 134.6 lb

## 2022-10-09 DIAGNOSIS — I10 Essential (primary) hypertension: Secondary | ICD-10-CM | POA: Diagnosis not present

## 2022-10-09 DIAGNOSIS — I4821 Permanent atrial fibrillation: Secondary | ICD-10-CM | POA: Diagnosis not present

## 2022-10-09 DIAGNOSIS — I890 Lymphedema, not elsewhere classified: Secondary | ICD-10-CM | POA: Diagnosis not present

## 2022-10-09 NOTE — Patient Instructions (Signed)
Medication Instructions:  Your physician recommends that you continue on your current medications as directed. Please refer to the Current Medication list given to you today.   Labwork: None today  Testing/Procedures: None today  Follow-Up: 6 months  Any Other Special Instructions Will Be Listed Below (If Applicable).  If you need a refill on your cardiac medications before your next appointment, please call your pharmacy.  

## 2022-10-26 DIAGNOSIS — I509 Heart failure, unspecified: Secondary | ICD-10-CM | POA: Diagnosis not present

## 2022-10-26 DIAGNOSIS — R0902 Hypoxemia: Secondary | ICD-10-CM | POA: Diagnosis not present

## 2022-11-26 DIAGNOSIS — I509 Heart failure, unspecified: Secondary | ICD-10-CM | POA: Diagnosis not present

## 2022-11-26 DIAGNOSIS — R0902 Hypoxemia: Secondary | ICD-10-CM | POA: Diagnosis not present

## 2022-12-27 DIAGNOSIS — I509 Heart failure, unspecified: Secondary | ICD-10-CM | POA: Diagnosis not present

## 2022-12-27 DIAGNOSIS — R0902 Hypoxemia: Secondary | ICD-10-CM | POA: Diagnosis not present

## 2022-12-30 ENCOUNTER — Other Ambulatory Visit: Payer: Self-pay | Admitting: Cardiology

## 2023-01-25 DIAGNOSIS — I509 Heart failure, unspecified: Secondary | ICD-10-CM | POA: Diagnosis not present

## 2023-01-25 DIAGNOSIS — R0902 Hypoxemia: Secondary | ICD-10-CM | POA: Diagnosis not present

## 2023-02-14 DIAGNOSIS — Z0001 Encounter for general adult medical examination with abnormal findings: Secondary | ICD-10-CM | POA: Diagnosis not present

## 2023-02-14 DIAGNOSIS — Z6823 Body mass index (BMI) 23.0-23.9, adult: Secondary | ICD-10-CM | POA: Diagnosis not present

## 2023-02-14 DIAGNOSIS — Z1331 Encounter for screening for depression: Secondary | ICD-10-CM | POA: Diagnosis not present

## 2023-02-14 DIAGNOSIS — E7849 Other hyperlipidemia: Secondary | ICD-10-CM | POA: Diagnosis not present

## 2023-02-14 DIAGNOSIS — M1991 Primary osteoarthritis, unspecified site: Secondary | ICD-10-CM | POA: Diagnosis not present

## 2023-02-14 DIAGNOSIS — E441 Mild protein-calorie malnutrition: Secondary | ICD-10-CM | POA: Diagnosis not present

## 2023-02-14 DIAGNOSIS — E782 Mixed hyperlipidemia: Secondary | ICD-10-CM | POA: Diagnosis not present

## 2023-02-14 DIAGNOSIS — I4891 Unspecified atrial fibrillation: Secondary | ICD-10-CM | POA: Diagnosis not present

## 2023-02-14 DIAGNOSIS — I10 Essential (primary) hypertension: Secondary | ICD-10-CM | POA: Diagnosis not present

## 2023-02-14 DIAGNOSIS — I509 Heart failure, unspecified: Secondary | ICD-10-CM | POA: Diagnosis not present

## 2023-02-25 DIAGNOSIS — I509 Heart failure, unspecified: Secondary | ICD-10-CM | POA: Diagnosis not present

## 2023-02-25 DIAGNOSIS — R0902 Hypoxemia: Secondary | ICD-10-CM | POA: Diagnosis not present

## 2023-02-26 ENCOUNTER — Inpatient Hospital Stay: Payer: PPO | Attending: Hematology

## 2023-02-26 DIAGNOSIS — Z79899 Other long term (current) drug therapy: Secondary | ICD-10-CM | POA: Diagnosis not present

## 2023-02-26 DIAGNOSIS — D696 Thrombocytopenia, unspecified: Secondary | ICD-10-CM | POA: Diagnosis not present

## 2023-02-26 DIAGNOSIS — D472 Monoclonal gammopathy: Secondary | ICD-10-CM | POA: Insufficient documentation

## 2023-02-26 DIAGNOSIS — I4891 Unspecified atrial fibrillation: Secondary | ICD-10-CM | POA: Diagnosis not present

## 2023-02-26 DIAGNOSIS — Z7901 Long term (current) use of anticoagulants: Secondary | ICD-10-CM | POA: Diagnosis not present

## 2023-02-26 LAB — COMPREHENSIVE METABOLIC PANEL
ALT: 9 U/L (ref 0–44)
AST: 17 U/L (ref 15–41)
Albumin: 4.1 g/dL (ref 3.5–5.0)
Alkaline Phosphatase: 59 U/L (ref 38–126)
Anion gap: 10 (ref 5–15)
BUN: 16 mg/dL (ref 8–23)
CO2: 31 mmol/L (ref 22–32)
Calcium: 9.3 mg/dL (ref 8.9–10.3)
Chloride: 97 mmol/L — ABNORMAL LOW (ref 98–111)
Creatinine, Ser: 0.84 mg/dL (ref 0.44–1.00)
GFR, Estimated: >60 mL/min (ref >60)
Glucose, Bld: 96 mg/dL (ref 70–99)
Potassium: 3.6 mmol/L (ref 3.5–5.1)
Sodium: 138 mmol/L (ref 135–145)
Total Bilirubin: 1.5 mg/dL — ABNORMAL HIGH (ref 0.3–1.2)
Total Protein: 6.7 g/dL (ref 6.5–8.1)

## 2023-02-26 LAB — CBC WITH DIFFERENTIAL/PLATELET
Abs Immature Granulocytes: 0.02 10*3/uL (ref 0.00–0.07)
Basophils Absolute: 0 10*3/uL (ref 0.0–0.1)
Basophils Relative: 0 %
Eosinophils Absolute: 0 10*3/uL (ref 0.0–0.5)
Eosinophils Relative: 1 %
HCT: 40.6 % (ref 36.0–46.0)
Hemoglobin: 13.2 g/dL (ref 12.0–15.0)
Immature Granulocytes: 0 %
Lymphocytes Relative: 22 %
Lymphs Abs: 1.2 10*3/uL (ref 0.7–4.0)
MCH: 30 pg (ref 26.0–34.0)
MCHC: 32.5 g/dL (ref 30.0–36.0)
MCV: 92.3 fL (ref 80.0–100.0)
Monocytes Absolute: 0.6 10*3/uL (ref 0.1–1.0)
Monocytes Relative: 10 %
Neutro Abs: 3.9 10*3/uL (ref 1.7–7.7)
Neutrophils Relative %: 67 %
Platelets: 138 10*3/uL — ABNORMAL LOW (ref 150–400)
RBC: 4.4 MIL/uL (ref 3.87–5.11)
RDW: 14.3 % (ref 11.5–15.5)
WBC: 5.8 10*3/uL (ref 4.0–10.5)
nRBC: 0 % (ref 0.0–0.2)

## 2023-02-27 LAB — KAPPA/LAMBDA LIGHT CHAINS
Kappa free light chain: 17.7 mg/L (ref 3.3–19.4)
Kappa, lambda light chain ratio: 1.32 (ref 0.26–1.65)
Lambda free light chains: 13.4 mg/L (ref 5.7–26.3)

## 2023-02-28 LAB — PROTEIN ELECTROPHORESIS, SERUM
A/G Ratio: 1.7 (ref 0.7–1.7)
Albumin ELP: 3.9 g/dL (ref 2.9–4.4)
Alpha-1-Globulin: 0.3 g/dL (ref 0.0–0.4)
Alpha-2-Globulin: 0.6 g/dL (ref 0.4–1.0)
Beta Globulin: 0.7 g/dL (ref 0.7–1.3)
Gamma Globulin: 0.7 g/dL (ref 0.4–1.8)
Globulin, Total: 2.3 g/dL (ref 2.2–3.9)
Total Protein ELP: 6.2 g/dL (ref 6.0–8.5)

## 2023-03-05 ENCOUNTER — Inpatient Hospital Stay (HOSPITAL_BASED_OUTPATIENT_CLINIC_OR_DEPARTMENT_OTHER): Payer: PPO | Admitting: Hematology

## 2023-03-05 ENCOUNTER — Encounter: Payer: Self-pay | Admitting: Hematology

## 2023-03-05 VITALS — BP 159/81 | HR 52 | Temp 97.6°F | Resp 18 | Wt 138.0 lb

## 2023-03-05 DIAGNOSIS — D696 Thrombocytopenia, unspecified: Secondary | ICD-10-CM

## 2023-03-05 DIAGNOSIS — D472 Monoclonal gammopathy: Secondary | ICD-10-CM | POA: Diagnosis not present

## 2023-03-05 NOTE — Patient Instructions (Signed)
[facility]  Discharge Instructions  You were seen and examined today by Dr. Ellin Saba.  Dr. Ellin Saba discussed your most recent lab work which revealed that everything looks good and stable.  Follow-up as scheduled in 1 year.    Thank you for choosing [facility] to provide your oncology and hematology care.   To afford each patient quality time with our provider, please arrive at least 15 minutes before your scheduled appointment time. You may need to reschedule your appointment if you arrive late (10 or more minutes). Arriving late affects you and other patients whose appointments are after yours.  Also, if you miss three or more appointments without notifying the office, you may be dismissed from the clinic at the provider's discretion.    Again, thank you for choosing [facility].  Our hope is that these requests will decrease the amount of time that you wait before being seen by our physicians.   If you have a lab appointment with the Cancer Center - please note that after April 8th, all labs will be drawn in the cancer center.  You do not have to check in or register with the main entrance as you have in the past but will complete your check-in at the cancer center.            _____________________________________________________________  Should you have questions after your visit to [facility], please contact our office at [phone] and follow the prompts.  Our office hours are 8:00 a.m. to 4:30 p.m. Monday - Thursday and 8:00 a.m. to 2:30 p.m. Friday.  Please note that voicemails left after 4:00 p.m. may not be returned until the following business day.  We are closed weekends and all major holidays.  You do have access to a nurse 24-7, just call the main number to the clinic [phone] and do not press any options, hold on the line and a nurse will answer the phone.    For prescription refill  requests, have your pharmacy contact our office and allow 72 hours.    Masks are no longer required in the cancer centers. If you would like for your care team to wear a mask while they are taking care of you, please let them know. You may have one support person who is at least 87 years old accompany you for your appointments.

## 2023-03-05 NOTE — Progress Notes (Signed)
[facility] [address], [place] [address]    Clinic Day:  03/05/2023  Referring physician: Elfredia Nevins, MD  Patient Care Team: Elfredia Nevins, MD as PCP - General (Internal Medicine) Jonelle Sidle, MD as PCP - Cardiology (Cardiology)   ASSESSMENT & PLAN:   Assessment: 1.  Mild thrombocytopenia: -Patient seen at the request of Dr. Sherwood Gambler for thrombocytopenia. -CBC on 06/26/2020 shows platelet count 129 with normal white count and hemoglobin. -CBC on 07/24/2020 shows platelet count 132 with normal white count and hemoglobin. -She was recently hospitalized for recurrent C. difficile infection.  Her platelet count went down to as low as 75 during hospitalization. -Denies any easy bruising.  No prior history of blood transfusions.   2.  Social/family history: -She lives at home.  Daughter lives with her and helps her. -She is able to do her ADLs. -Never smoker.  Sister had leukemia.    Plan: 1.  Mild thrombocytopenia: - Differential diagnosis includes ITP versus MDS. - Latest platelet count is 138.  No bleeding issues.  Will check back in a year.  2.  IgM kappa MGUS: - We reviewed myeloma panel from 02/25/2022: M spike is not observed.  Free light chain ratio is normal.  Previous immunofixation showed IgM kappa. - Creatinine and calcium and hemoglobin are normal. - RTC 1 year for follow-up with repeat labs.   3.  Atrial fibrillation: - Continue Xarelto.  No bleeding issues.  Orders Placed This Encounter  Procedures   CBC with Differential/Platelet    Standing Status:   Future    Standing Expiration Date:   03/04/2024    Order Specific Question:   Release to patient    Answer:   Immediate   Comprehensive metabolic panel    Standing Status:   Future    Standing Expiration Date:   03/04/2024    Order Specific Question:   Release to patient    Answer:   Immediate   Protein electrophoresis, serum    Standing Status:   Future    Standing  Expiration Date:   03/04/2024    Order Specific Question:   Release to patient    Answer:   Immediate   Kappa/lambda light chains    Standing Status:   Future    Standing Expiration Date:   03/04/2024      I,Katie Daubenspeck,acting as a scribe for Doreatha Massed, MD.,have documented all relevant documentation on the behalf of Doreatha Massed, MD,as directed by  Doreatha Massed, MD while in the presence of Doreatha Massed, MD.   I, Doreatha Massed MD, have reviewed the above documentation for accuracy and completeness, and I agree with the above.   Doreatha Massed, MD   4/24/20244:40 PM  CHIEF COMPLAINT:   Diagnosis: thrombocytopenia    Cancer Staging  No matching staging information was found for the patient.   Prior Therapy: none  Current Therapy:  observation   HISTORY OF PRESENT ILLNESS:   Oncology History   No history exists.     INTERVAL HISTORY:   Kathryn Marquez is a 89 y.o. female presenting to clinic today for follow up of thrombocytopenia. She was last seen by me on 09/03/22.  Today, she states that she is doing well overall. Her appetite level is at 100%. Her energy level is at 100%.  PAST MEDICAL HISTORY:   Past Medical History: Past Medical History:  Diagnosis Date   Atrial fibrillation    Essential hypertension    History of  stroke 04/29/2012   Recurrent Clostridioides difficile diarrhea    Thrombocytopenia    TIA (transient ischemic attack)     Surgical History: Past Surgical History:  Procedure Laterality Date   No prior surgery      Social History: Social History   Socioeconomic History   Marital status: Widowed    Spouse name: Not on file   Number of children: Not on file   Years of education: Not on file   Highest education level: Not on file  Occupational History   Not on file  Tobacco Use   Smoking status: Never   Smokeless tobacco: Never  Vaping Use   Vaping Use: Never used  Substance and Sexual Activity    Alcohol use: No   Drug use: No   Sexual activity: Not on file  Other Topics Concern   Not on file  Social History Narrative   Not on file   Social Determinants of Health   Financial Resource Strain: Not on file  Food Insecurity: Not on file  Transportation Needs: Not on file  Physical Activity: Not on file  Stress: Not on file  Social Connections: Not on file  Intimate Partner Violence: Not on file    Family History: Family History  Problem Relation Age of Onset   Stroke Other    Cancer Other    Heart attack Mother    Fibromyalgia Mother    Hypercholesterolemia Mother    Leukemia Sister    Cancer Brother    Stroke Father    Heart attack Brother    Diabetes Son    Fibromyalgia Son    Hypertension Son     Current Medications:  Current Outpatient Medications:    ascorbic acid (VITAMIN C) 500 MG tablet, Take 500 mg by mouth daily., Disp: , Rfl:    calcium-vitamin D (OSCAL WITH D) 500-200 MG-UNIT tablet, Take 1 tablet by mouth daily with breakfast., Disp: , Rfl:    cetirizine (ZYRTEC) 10 MG tablet, Take 10 mg by mouth in the morning. , Disp: , Rfl:    ciprofloxacin (CIPRO) 500 MG tablet, Take 500 mg by mouth 2 (two) times daily., Disp: , Rfl:    Cyanocobalamin (B-12 PO), Take 1 tablet by mouth daily., Disp: , Rfl:    furosemide (LASIX) 40 MG tablet, TAKE 1 TABLET BY MOUTH ONCE A DAY., Disp: 90 tablet, Rfl: 3   guaiFENesin-dextromethorphan (ROBITUSSIN DM) 100-10 MG/5ML syrup, Take 10 mLs by mouth every 4 (four) hours as needed for cough., Disp: 118 mL, Rfl: 0   Multiple Vitamins-Minerals (VITAMIN D3 COMPLETE PO), Take 1 tablet by mouth daily., Disp: , Rfl:    Omeprazole 20 MG TBEC, Take 1 tablet (20 mg total) by mouth at bedtime., Disp: 30 tablet, Rfl: 0   potassium chloride (KLOR-CON) 10 MEQ tablet, TAKE 1 TABLET BY MOUTH ONCE A DAY., Disp: 90 tablet, Rfl: 3   predniSONE (DELTASONE) 10 MG tablet, Take 3 tabs po QAM x 3 days, then 2 po QAM x 3 days, then 1 po QAM x 3  days then stop, Disp: 18 tablet, Rfl: 0   saccharomyces boulardii (FLORASTOR) 250 MG capsule, Take 250 mg by mouth 2 (two) times daily., Disp: , Rfl:    traMADol (ULTRAM) 50 MG tablet, Take 50 mg by mouth every 6 (six) hours as needed., Disp: , Rfl:    triamcinolone cream (KENALOG) 0.1 %, Apply topically 2 (two) times daily., Disp: , Rfl:    XARELTO 15 MG TABS tablet, Take  15 mg by mouth in the morning. , Disp: , Rfl:    zinc sulfate 220 (50 Zn) MG capsule, Take 1 capsule (220 mg total) by mouth daily., Disp: 30 capsule, Rfl: 0   Allergies: No Known Allergies  REVIEW OF SYSTEMS:   Review of Systems  Constitutional:  Negative for chills, fatigue and fever.  HENT:   Negative for lump/mass, mouth sores, nosebleeds, sore throat and trouble swallowing.   Eyes:  Negative for eye problems.  Respiratory:  Negative for cough and shortness of breath.   Cardiovascular:  Positive for leg swelling. Negative for chest pain and palpitations.  Gastrointestinal:  Negative for abdominal pain, constipation, diarrhea, nausea and vomiting.  Genitourinary:  Negative for bladder incontinence, difficulty urinating, dysuria, frequency, hematuria and nocturia.   Musculoskeletal:  Negative for arthralgias, back pain, flank pain, myalgias and neck pain.  Skin:  Negative for itching and rash.  Neurological:  Negative for dizziness, headaches and numbness.  Hematological:  Does not bruise/bleed easily.  Psychiatric/Behavioral:  Negative for depression, sleep disturbance and suicidal ideas. The patient is not nervous/anxious.   All other systems reviewed and are negative.    VITALS:   Blood pressure (!) 159/81, pulse (!) 52, temperature 97.6 F (36.4 C), temperature source Oral, resp. rate 18, weight 138 lb (62.6 kg), SpO2 98 %.  Wt Readings from Last 3 Encounters:  03/05/23 138 lb (62.6 kg)  10/09/22 134 lb 9.6 oz (61.1 kg)  09/03/22 138 lb 3.2 oz (62.7 kg)    Body mass index is 24.45 kg/m.  Performance  status (ECOG): 1 - Symptomatic but completely ambulatory  PHYSICAL EXAM:   Physical Exam Vitals and nursing note reviewed. Exam conducted with a chaperone present.  Constitutional:      Appearance: Normal appearance.  Cardiovascular:     Rate and Rhythm: Normal rate and regular rhythm.     Pulses: Normal pulses.     Heart sounds: Normal heart sounds.  Pulmonary:     Effort: Pulmonary effort is normal.     Breath sounds: Normal breath sounds.  Abdominal:     Palpations: Abdomen is soft. There is no hepatomegaly, splenomegaly or mass.     Tenderness: There is no abdominal tenderness.  Musculoskeletal:     Right lower leg: Edema present.     Left lower leg: Edema present.  Lymphadenopathy:     Cervical: No cervical adenopathy.     Right cervical: No superficial, deep or posterior cervical adenopathy.    Left cervical: No superficial, deep or posterior cervical adenopathy.     Upper Body:     Right upper body: No supraclavicular or axillary adenopathy.     Left upper body: No supraclavicular or axillary adenopathy.  Neurological:     General: No focal deficit present.     Mental Status: She is alert and oriented to person, place, and time.  Psychiatric:        Mood and Affect: Mood normal.        Behavior: Behavior normal.     LABS:      Latest Ref Rng & Units 02/26/2023    1:44 PM 08/27/2022    1:43 PM 02/26/2022   10:56 AM  CBC  WBC 4.0 - 10.5 K/uL 5.8  7.6  5.4   Hemoglobin 12.0 - 15.0 g/dL 40.9  81.1  91.4   Hematocrit 36.0 - 46.0 % 40.6  42.5  41.9   Platelets 150 - 400 K/uL 138  164  136  Latest Ref Rng & Units 02/26/2023    1:44 PM 08/27/2022    1:43 PM 02/26/2022   10:56 AM  CMP  Glucose 70 - 99 mg/dL 96  161  99   BUN 8 - 23 mg/dL Creatinine 0.44 - 1.00 mg/dL 0.96  0.45  4.09   Sodium 135 - 145 mmol/L 138  136  142   Potassium 3.5 - 5.1 mmol/L 3.6  3.7  4.6   Chloride 98 - 111 mmol/L 97  98  102   CO2 22 - 32 mmol/L 31  29  32    Calcium 8.9 - 10.3 mg/dL 9.3  9.0  9.5   Total Protein 6.5 - 8.1 g/dL 6.7  6.7  6.7   Total Bilirubin 0.3 - 1.2 mg/dL 1.5  1.2  1.0   Alkaline Phos 38 - 126 U/L 59  83  58   AST 15 - 41 U/L ALT 0 - 44 U/L No results found for: "CEA1", "CEA" / No results found for: "CEA1", "CEA" No results found for: "PSA1" No results found for: "CAN199" No results found for: "CAN125"  Lab Results  Component Value Date   TOTALPROTELP 6.2 02/26/2023   ALBUMINELP 3.9 02/26/2023   A1GS 0.3 02/26/2023   A2GS 0.6 02/26/2023   BETS 0.7 02/26/2023   GAMS 0.7 02/26/2023   MSPIKE Not Observed 02/26/2023   SPEI Comment 02/26/2023   Lab Results  Component Value Date   FERRITIN 331 (H) 05/30/2021   FERRITIN 313 (H) 05/29/2021   FERRITIN 290 05/28/2021   Lab Results  Component Value Date   LDH 150 02/26/2022   LDH 152 08/14/2021   LDH 149 05/26/2021     STUDIES:   No results found.

## 2023-03-27 DIAGNOSIS — R0902 Hypoxemia: Secondary | ICD-10-CM | POA: Diagnosis not present

## 2023-03-27 DIAGNOSIS — I509 Heart failure, unspecified: Secondary | ICD-10-CM | POA: Diagnosis not present

## 2023-04-27 DIAGNOSIS — R0902 Hypoxemia: Secondary | ICD-10-CM | POA: Diagnosis not present

## 2023-04-27 DIAGNOSIS — I509 Heart failure, unspecified: Secondary | ICD-10-CM | POA: Diagnosis not present

## 2023-05-27 DIAGNOSIS — I509 Heart failure, unspecified: Secondary | ICD-10-CM | POA: Diagnosis not present

## 2023-05-27 DIAGNOSIS — R0902 Hypoxemia: Secondary | ICD-10-CM | POA: Diagnosis not present

## 2023-06-27 DIAGNOSIS — R0902 Hypoxemia: Secondary | ICD-10-CM | POA: Diagnosis not present

## 2023-06-27 DIAGNOSIS — I509 Heart failure, unspecified: Secondary | ICD-10-CM | POA: Diagnosis not present

## 2023-07-28 DIAGNOSIS — R0902 Hypoxemia: Secondary | ICD-10-CM | POA: Diagnosis not present

## 2023-07-28 DIAGNOSIS — I509 Heart failure, unspecified: Secondary | ICD-10-CM | POA: Diagnosis not present

## 2023-07-30 ENCOUNTER — Ambulatory Visit: Payer: PPO | Attending: Cardiology | Admitting: Cardiology

## 2023-07-30 ENCOUNTER — Encounter: Payer: Self-pay | Admitting: Cardiology

## 2023-07-30 VITALS — BP 130/84 | HR 76 | Ht 64.0 in | Wt 139.2 lb

## 2023-07-30 DIAGNOSIS — I10 Essential (primary) hypertension: Secondary | ICD-10-CM

## 2023-07-30 DIAGNOSIS — I4821 Permanent atrial fibrillation: Secondary | ICD-10-CM | POA: Diagnosis not present

## 2023-07-30 DIAGNOSIS — I890 Lymphedema, not elsewhere classified: Secondary | ICD-10-CM

## 2023-07-30 NOTE — Patient Instructions (Signed)
Medication Instructions:  Your physician recommends that you continue on your current medications as directed. Please refer to the Current Medication list given to you today.   Labwork: None today  Testing/Procedures: None today  Follow-Up: 6 months  Any Other Special Instructions Will Be Listed Below (If Applicable).  If you need a refill on your cardiac medications before your next appointment, please call your pharmacy.  

## 2023-07-30 NOTE — Progress Notes (Signed)
Cardiology Office Note  Date: 07/30/2023   ID: Kathryn Marquez, DOB [birth year], MRN [id number]  History of Present Illness: Kathryn Marquez is a 89 y.o. female last seen in November 2023.  She is here today with family member for a follow-up visit.  Recently celebrated her 89th birthday.  She does not report any palpitations and has had good control of leg swelling with Lasix.  I reviewed her medications and updated the list.  She is on Toprol-XL 25 mg daily along with Xarelto and Lasix as before.  Lab work is noted below.  Physical Exam: VS:  BP 130/84 (BP Location: Left Arm, Patient Position: Sitting, Cuff Size: Normal)   Pulse 76   Ht 5\' 4"  (1.626 m)   Wt 139 lb 3.2 oz (63.1 kg)   SpO2 92%   BMI 23.89 kg/m , BMI Body mass index is 23.89 kg/m.  Wt Readings from Last 3 Encounters:  07/30/23 139 lb 3.2 oz (63.1 kg)  03/05/23 138 lb (62.6 kg)  10/09/22 134 lb 9.6 oz (61.1 kg)    General: Patient appears comfortable at rest. HEENT: Conjunctiva and lids normal. Neck: Supple, no elevated JVP or carotid bruits. Lungs: Clear to auscultation, nonlabored breathing at rest. Cardiac: Irregularly irregular, 1/6 systolic murmur, no gallop. Extremities: Mild leg edema.  ECG:  An ECG dated 10/09/2022 was personally reviewed today and demonstrated:  Rate controlled atrial fibrillation with left posterior fascicular block and nonspecific ST-T changes.  Labwork: 02/26/2023: ALT 9; AST 17; BUN 16; Creatinine, Ser 0.84; Hemoglobin 13.2; Platelets 138; Potassium 3.6; Sodium 138  April 2024: Cholesterol 159, triglycerides 54, HDL 88, LDL 60, TSH 1.8  Other Studies Reviewed Today:  No interval cardiac testing for review today.  Assessment and Plan:  1.  Permanent atrial fibrillation with CHA2DS2-VASc score of 7.  Creatinine clearance 35 by most recent creatinine.  She continues on Xarelto 15 mg daily and does not report any obvious spontaneous bleeding problems or stool changes.  She is on  Toprol XL 25 mg daily as well.  No changes were made today.  2.  Leg edema/lymphedema.  Continue Lasix with potassium supplement.  3.  Essential hypertension.  Blood pressure is reasonable today.  Disposition:  Follow up  6 months.  Signed, Jonelle Sidle, M.D., F.A.C.C. [facility]

## 2023-08-01 ENCOUNTER — Ambulatory Visit: Payer: PPO | Admitting: Cardiology

## 2023-08-27 DIAGNOSIS — I509 Heart failure, unspecified: Secondary | ICD-10-CM | POA: Diagnosis not present

## 2023-08-27 DIAGNOSIS — R0902 Hypoxemia: Secondary | ICD-10-CM | POA: Diagnosis not present

## 2023-09-19 DIAGNOSIS — I10 Essential (primary) hypertension: Secondary | ICD-10-CM | POA: Diagnosis not present

## 2023-09-19 DIAGNOSIS — I4891 Unspecified atrial fibrillation: Secondary | ICD-10-CM | POA: Diagnosis not present

## 2023-09-19 DIAGNOSIS — H6120 Impacted cerumen, unspecified ear: Secondary | ICD-10-CM | POA: Diagnosis not present

## 2023-09-19 DIAGNOSIS — M549 Dorsalgia, unspecified: Secondary | ICD-10-CM | POA: Diagnosis not present

## 2023-09-27 DIAGNOSIS — I509 Heart failure, unspecified: Secondary | ICD-10-CM | POA: Diagnosis not present

## 2023-09-27 DIAGNOSIS — R0902 Hypoxemia: Secondary | ICD-10-CM | POA: Diagnosis not present

## 2023-10-27 DIAGNOSIS — R0902 Hypoxemia: Secondary | ICD-10-CM | POA: Diagnosis not present

## 2023-10-27 DIAGNOSIS — I509 Heart failure, unspecified: Secondary | ICD-10-CM | POA: Diagnosis not present

## 2023-11-09 ENCOUNTER — Emergency Department (HOSPITAL_COMMUNITY)
Admission: EM | Admit: 2023-11-09 | Discharge: 2023-11-09 | Disposition: A | Discharge status: Home / Self Care | Payer: PPO | Attending: Emergency Medicine | Admitting: Emergency Medicine

## 2023-11-09 ENCOUNTER — Encounter (HOSPITAL_COMMUNITY): Payer: Self-pay | Admitting: Emergency Medicine

## 2023-11-09 ENCOUNTER — Other Ambulatory Visit: Payer: Self-pay

## 2023-11-09 ENCOUNTER — Emergency Department (HOSPITAL_COMMUNITY): Payer: PPO

## 2023-11-09 DIAGNOSIS — R059 Cough, unspecified: Secondary | ICD-10-CM | POA: Diagnosis not present

## 2023-11-09 DIAGNOSIS — I517 Cardiomegaly: Secondary | ICD-10-CM | POA: Diagnosis not present

## 2023-11-09 DIAGNOSIS — Z8673 Personal history of transient ischemic attack (TIA), and cerebral infarction without residual deficits: Secondary | ICD-10-CM | POA: Insufficient documentation

## 2023-11-09 DIAGNOSIS — R4182 Altered mental status, unspecified: Secondary | ICD-10-CM | POA: Diagnosis not present

## 2023-11-09 DIAGNOSIS — R9082 White matter disease, unspecified: Secondary | ICD-10-CM | POA: Diagnosis not present

## 2023-11-09 DIAGNOSIS — I10 Essential (primary) hypertension: Secondary | ICD-10-CM | POA: Insufficient documentation

## 2023-11-09 DIAGNOSIS — U071 COVID-19: Secondary | ICD-10-CM | POA: Insufficient documentation

## 2023-11-09 DIAGNOSIS — I700 Atherosclerosis of aorta: Secondary | ICD-10-CM | POA: Diagnosis not present

## 2023-11-09 DIAGNOSIS — R410 Disorientation, unspecified: Secondary | ICD-10-CM | POA: Insufficient documentation

## 2023-11-09 DIAGNOSIS — Z7901 Long term (current) use of anticoagulants: Secondary | ICD-10-CM | POA: Diagnosis not present

## 2023-11-09 DIAGNOSIS — Z79899 Other long term (current) drug therapy: Secondary | ICD-10-CM | POA: Insufficient documentation

## 2023-11-09 DIAGNOSIS — R918 Other nonspecific abnormal finding of lung field: Secondary | ICD-10-CM | POA: Diagnosis not present

## 2023-11-09 DIAGNOSIS — J984 Other disorders of lung: Secondary | ICD-10-CM | POA: Diagnosis not present

## 2023-11-09 DIAGNOSIS — N3000 Acute cystitis without hematuria: Secondary | ICD-10-CM | POA: Insufficient documentation

## 2023-11-09 LAB — COMPREHENSIVE METABOLIC PANEL
ALT: 12 U/L (ref 0–44)
AST: 22 U/L (ref 15–41)
Albumin: 4 g/dL (ref 3.5–5.0)
Alkaline Phosphatase: 54 U/L (ref 38–126)
Anion gap: 13 (ref 5–15)
BUN: 17 mg/dL (ref 8–23)
CO2: 29 mmol/L (ref 22–32)
Calcium: 9.3 mg/dL (ref 8.9–10.3)
Chloride: 93 mmol/L — ABNORMAL LOW (ref 98–111)
Creatinine, Ser: 0.85 mg/dL (ref 0.44–1.00)
GFR, Estimated: >60 mL/min (ref >60)
Glucose, Bld: 110 mg/dL — ABNORMAL HIGH (ref 70–99)
Potassium: 3.5 mmol/L (ref 3.5–5.1)
Sodium: 135 mmol/L (ref 135–145)
Total Bilirubin: 1 mg/dL (ref <1.2)
Total Protein: 6.9 g/dL (ref 6.5–8.1)

## 2023-11-09 LAB — URINALYSIS, ROUTINE W REFLEX MICROSCOPIC
Bilirubin Urine: NEGATIVE
Glucose, UA: NEGATIVE mg/dL
Hgb urine dipstick: NEGATIVE
Ketones, ur: NEGATIVE mg/dL
Nitrite: NEGATIVE
Protein, ur: NEGATIVE mg/dL
Specific Gravity, Urine: 1.01 (ref 1.005–1.030)
pH: 6 (ref 5.0–8.0)

## 2023-11-09 LAB — CBC WITH DIFFERENTIAL/PLATELET
Abs Immature Granulocytes: 0.02 10*3/uL (ref 0.00–0.07)
Basophils Absolute: 0 10*3/uL (ref 0.0–0.1)
Basophils Relative: 0 %
Eosinophils Absolute: 0 10*3/uL (ref 0.0–0.5)
Eosinophils Relative: 0 %
HCT: 44 % (ref 36.0–46.0)
Hemoglobin: 14.3 g/dL (ref 12.0–15.0)
Immature Granulocytes: 0 %
Lymphocytes Relative: 15 %
Lymphs Abs: 0.8 10*3/uL (ref 0.7–4.0)
MCH: 29.4 pg (ref 26.0–34.0)
MCHC: 32.5 g/dL (ref 30.0–36.0)
MCV: 90.5 fL (ref 80.0–100.0)
Monocytes Absolute: 0.8 10*3/uL (ref 0.1–1.0)
Monocytes Relative: 16 %
Neutro Abs: 3.5 10*3/uL (ref 1.7–7.7)
Neutrophils Relative %: 69 %
Platelets: 110 10*3/uL — ABNORMAL LOW (ref 150–400)
RBC: 4.86 MIL/uL (ref 3.87–5.11)
RDW: 14 % (ref 11.5–15.5)
WBC: 5.2 10*3/uL (ref 4.0–10.5)
nRBC: 0 % (ref 0.0–0.2)

## 2023-11-09 LAB — SARS CORONAVIRUS 2 BY RT PCR: SARS Coronavirus 2 by RT PCR: POSITIVE — AB

## 2023-11-09 LAB — LIPASE, BLOOD: Lipase: 57 U/L — ABNORMAL HIGH (ref 11–51)

## 2023-11-09 MED ORDER — CEPHALEXIN 500 MG PO CAPS: 500.0000 mg | ORAL_CAPSULE | Freq: Two times a day (BID) | ORAL | 0 refills | Status: AC

## 2023-11-09 MED ORDER — CEPHALEXIN 500 MG PO CAPS
500.0000 mg | ORAL_CAPSULE | ORAL | Status: AC
  Administered 2023-11-09: 500 mg via ORAL
  Filled 2023-11-09: qty 1

## 2023-11-09 NOTE — ED Provider Notes (Signed)
[place] [facility] DE[facility]MENT AT [facility] Provider Note   CSN: [id number] Arrival date & time: 11/09/23  1519     History  Chief Complaint  Patient presents with   Altered Mental Status    Kathryn Marquez is a 89 y.o. female.  89 year old female with a history of thrombocytopenia, recurrent C. difficile, hypertension, atrial fibrillation, and stroke on Xarelto who presents to the emergency department with confusion.  History obtained per the patient's daughter who reports that she tested positive for COVID yesterday.  Has been having some cough and subjective fevers for several days as well.  Noted that she started becoming confused and would point outside thinking that her son was there when he was not.  Has also been in a normal train of thought and said a "you know..." And then trail off which is atypical for her.  Typically is alert and oriented to self and place but not year.       Home Medications Prior to Admission medications   Medication Sig Start Date End Date Taking? Authorizing Provider  cephALEXin (KEFLEX) 500 MG capsule Take 1 capsule (500 mg total) by mouth 2 (two) times daily for 5 days. 11/09/23 11/14/23 Yes Rondel Baton, MD  ascorbic acid (VITAMIN C) 500 MG tablet Take 500 mg by mouth daily.    [provider]  calcium-vitamin D (OSCAL WITH D) 500-200 MG-UNIT tablet Take 1 tablet by mouth daily with breakfast.    [provider]  cetirizine (ZYRTEC) 10 MG tablet Take 10 mg by mouth in the morning.     [provider]  Cyanocobalamin (B-12 PO) Take 1 tablet by mouth daily.    [provider]  furosemide (LASIX) 40 MG tablet TAKE 1 TABLET BY MOUTH ONCE A DAY. 12/30/22   Jonelle Sidle, MD  guaiFENesin-dextromethorphan (ROBITUSSIN DM) 100-10 MG/5ML syrup Take 10 mLs by mouth every 4 (four) hours as needed for cough. 05/30/21   Johnson, Clanford L, MD  metoprolol succinate (TOPROL-XL) 25 MG 24 hr tablet Take 25  mg by mouth daily.    [provider]  Multiple Vitamins-Minerals (VITAMIN D3 COMPLETE PO) Take 1 tablet by mouth daily.    [provider]  potassium chloride (KLOR-CON) 10 MEQ tablet TAKE 1 TABLET BY MOUTH ONCE A DAY. 12/30/22   Jonelle Sidle, MD  saccharomyces boulardii (FLORASTOR) 250 MG capsule Take 250 mg by mouth 2 (two) times daily.    [provider]  triamcinolone cream (KENALOG) 0.1 % Apply topically 2 (two) times daily. 02/26/22   [provider]  XARELTO 15 MG TABS tablet Take 15 mg by mouth in the morning.  12/14/19   [provider]  zinc sulfate 220 (50 Zn) MG capsule Take 1 capsule (220 mg total) by mouth daily. 05/31/21   Cleora Fleet, MD      Allergies    Patient has no known allergies.    Review of Systems   Review of Systems  Physical Exam Updated Vital Signs BP 132/72   Pulse 68   Temp 99.1 F (37.3 C) (Oral)   Resp 19   Ht 5\' 4"  (1.626 m)   Wt 63 kg   SpO2 94%   BMI 23.84 kg/m  Physical Exam Vitals and nursing note reviewed.  Constitutional:      General: She is not in acute distress.    Appearance: She is well-developed.  HENT:     Head: Normocephalic and atraumatic.  Right Ear: External ear normal.     Left Ear: External ear normal.     Nose: Nose normal.  Eyes:     Extraocular Movements: Extraocular movements intact.     Conjunctiva/sclera: Conjunctivae normal.     Pupils: Pupils are equal, round, and reactive to light.  Cardiovascular:     Rate and Rhythm: Normal rate and regular rhythm.     Heart sounds: No murmur heard. Pulmonary:     Effort: Pulmonary effort is normal. No respiratory distress.     Breath sounds: Normal breath sounds.  Musculoskeletal:     Cervical back: Normal range of motion and neck supple.     Right lower leg: No edema.     Left lower leg: No edema.  Skin:    General: Skin is warm and dry.  Neurological:     Mental Status: She is alert. Mental status is at  baseline.     Cranial Nerves: No cranial nerve deficit.     Sensory: No sensory deficit.     Motor: No weakness.     Coordination: Coordination normal.     Comments: Alert and oriented to self, place, and the fact that it was recently Christmas.  Did not know the year.  Psychiatric:        Mood and Affect: Mood normal.     ED Results / Procedures / Treatments   Labs (all labs ordered are listed, but only abnormal results are displayed) Labs Reviewed  SARS CORONAVIRUS 2 BY RT PCR - Abnormal; Notable for the following components:      Result Value   SARS Coronavirus 2 by RT PCR POSITIVE (*)    All other components within normal limits  CBC WITH DIFFERENTIAL/PLATELET - Abnormal; Notable for the following components:   Platelets 110 (*)    All other components within normal limits  COMPREHENSIVE METABOLIC PANEL - Abnormal; Notable for the following components:   Chloride 93 (*)    Glucose, Bld 110 (*)    All other components within normal limits  LIPASE, BLOOD - Abnormal; Notable for the following components:   Lipase 57 (*)    All other components within normal limits  URINALYSIS, ROUTINE W REFLEX MICROSCOPIC - Abnormal; Notable for the following components:   APPearance HAZY (*)    Leukocytes,Ua SMALL (*)    Bacteria, UA RARE (*)    All other components within normal limits  URINE CULTURE    EKG None  Radiology DG Chest 2 View Result Date: 11/09/2023 CLINICAL DATA:  Cough. EXAM: CHEST - 2 VIEW COMPARISON:  Same day. FINDINGS: Stable cardiomegaly. Stable dextroscoliosis of thoracic spine. Stable large left pleural calcification. Right lung is clear. Left basilar atelectasis, scarring or infiltrate is noted. IMPRESSION: Stable large left pleural calcification with left basilar opacity concerning for atelectasis, scarring or infiltrate. Electronically Signed   By: Lupita Raider M.D.   On: 11/09/2023 18:36   CT Head Wo Contrast Result Date: 11/09/2023 CLINICAL DATA:   Altered mental status. Confusion. Patient was diagnosed with COVID yesterday. EXAM: CT HEAD WITHOUT CONTRAST TECHNIQUE: Contiguous axial images were obtained from the base of the skull through the vertex without intravenous contrast. RADIATION DOSE REDUCTION: This exam was performed according to the departmental dose-optimization program which includes automated exposure control, adjustment of the mA and/or kV according to patient size and/or use of iterative reconstruction technique. COMPARISON:  MR head without contrast 06/11/2020 FINDINGS: Brain: Remote left parietal and occipital lobe infarcts are again  noted. Remote infarcts of the left middle frontal gyrus are stable. Generalized atrophy and white matter disease is otherwise similar the prior exam. No acute infarct, hemorrhage, or mass lesion is present. The ventricles are proportionate to the degree of atrophy. No significant extraaxial fluid collection is present. The brainstem and cerebellum are within normal limits. Midline structures are within normal limits. Vascular: No hyperdense vessel or unexpected calcification. Skull: Calvarium is intact. No focal lytic or blastic lesions are present. No significant extracranial soft tissue lesion is present. Sinuses/Orbits: The paranasal sinuses and mastoid air cells are clear. Bilateral lens replacements are noted. Globes and orbits are otherwise unremarkable. IMPRESSION: 1. No acute intracranial abnormality or significant interval change. 2. Remote left parietal and occipital lobe infarcts. 3. Remote infarcts of the left middle frontal gyrus. 4. Generalized atrophy and white matter disease is otherwise similar the prior exam and likely reflects the sequela of chronic microvascular ischemia. Electronically Signed   By: Marin Roberts M.D.   On: 11/09/2023 18:28   DG Chest Portable 1 View Result Date: 11/09/2023 CLINICAL DATA:  Cough, COVID. EXAM: PORTABLE CHEST 1 VIEW COMPARISON:  08/27/2018. FINDINGS:  Chronic deformity with pleural calcification left lower chest wall laterally. Densely calcified aorta. Volume loss left hemithorax. Shift of mediastinal structures towards the right. Right lung clear. Diffuse consolidation left hemithorax. No pneumothorax. Images of the upper left hemithorax obscured by positioning. IMPRESSION: Volume loss and diffuse consolidation left hemithorax. Electronically Signed   By: Layla Maw M.D.   On: 11/09/2023 17:03    Procedures Procedures    Medications Ordered in ED Medications  cephALEXin (KEFLEX) capsule 500 mg (500 mg Oral Given 11/09/23 2008)    ED Course/ Medical Decision Making/ A&P                                 Medical Decision Making Amount and/or Complexity of Data Reviewed Labs: ordered. Radiology: ordered.  Risk Prescription drug management.   Kathryn Marquez is a 89 y.o. female with comorbidities that complicate the patient evaluation including thrombocytopenia, recurrent C. difficile, hypertension, atrial fibrillation, and stroke on Xarelto who presents to the emergency department with confusion.     Initial Ddx:  Delirium, COVID-19, stroke, ICH, UTI  MDM/Course:  Patient presents to the emergency department with confusion.  Appears to be at her mental baseline at this point in time though.  Has a nonfocal neuroexam.  With her history of thrombocytopenia and blood thinner use did obtain a CT of the head which did not show evidence of ICH.  Platelets appear only marginally low today at 110.  Had a urinalysis that shows possible UTI.  Urine culture was added.  Given Keflex for her UTI.  She did have a COVID test here to confirm that was positive.  Suspect she likely is having some mild delirium from COVID and possibly UTI.  Upon re-evaluation patient remained stable.  Do not feel that there would be any benefit in admitting at this time as it may make her delirium worse especially since she is at her baseline at this time.  With her  Xarelto use not a candidate for Paxlovid.  Will have her follow-up with her primary doctor.  This patient presents to the ED for concern of complaints listed in HPI, this involves an extensive number of treatment options, and is a complaint that carries with it a high risk of complications and morbidity. Disposition  including potential need for admission considered.   Dispo: DC Home. Return precautions discussed including, but not limited to, those listed in the AVS. Allowed pt time to ask questions which were answered fully prior to dc.  Additional history obtained from daughter Records reviewed Outpatient Clinic Notes The following labs were independently interpreted: Chemistry and show no acute abnormality I independently reviewed the following imaging with scope of interpretation limited to determining acute life threatening conditions related to emergency care: Chest x-ray and agree with the radiologist interpretation with the following exceptions: none I personally reviewed and interpreted cardiac monitoring: normal sinus rhythm  I personally reviewed and interpreted the pt's EKG: see above for interpretation  I have reviewed the patients home medications and made adjustments as needed Social Determinants of health:  Elderly  Portions of this note were generated with Scientist, clinical (histocompatibility and immunogenetics). Dictation errors may occur despite best attempts at proofreading.     Final Clinical Impression(s) / ED Diagnoses Final diagnoses:  Disorientation  COVID-19  Acute cystitis without hematuria    Rx / DC Orders ED Discharge Orders          Ordered    cephALEXin (KEFLEX) 500 MG capsule  2 times daily        11/09/23 1952              Rondel Baton, MD 11/10/23 1742

## 2023-11-09 NOTE — ED Triage Notes (Signed)
Per daughter pt is more confused since being diagnosed with covid yesterday.

## 2023-11-09 NOTE — ED Provider Triage Note (Signed)
Emergency Medicine Provider Triage Evaluation Note  Kathryn Marquez , a 89 y.o. female  was evaluated in triage.  Pt complains of ams since completing a positive home Covid test yesterday.  Dg states she has been reaching for items that are not there.  She has a moderate cough, she denies sob, cp, dysuria, diarrhea but endorses some discomfort across her upper abdomen.  Denies fever.  Review of Systems  Positive: Cough, upper abd pain, confusion Negative: Cp, sob, n/v/d fever  Physical Exam  BP (!) 147/81   Pulse 82   Temp 99.2 F (37.3 C) (Oral)   Resp 20   Ht 5\' 4"  (1.626 m)   Wt 63 kg   SpO2 94%   BMI 23.84 kg/m  Gen:   Awake, no distress   Resp:  Normal effort  MSK:   Moves extremities without difficulty  Other:  Discomfort bilateral upper abd without guarding  Medical Decision Making  Medically screening exam initiated at 4:25 PM.  Appropriate orders placed.  Kathryn Marquez was informed that the remainder of the evaluation will be completed by another provider, this initial triage assessment does not replace that evaluation, and the importance of remaining in the ED until their evaluation is complete.     Burgess Amor, PA-C 11/09/23 9811

## 2023-11-09 NOTE — Discharge Instructions (Signed)
You were seen for your COVID infection and urinary tract infection in the emergency department.   At home, please take the antibiotics we prescribed you.    Check your MyChart online for the results of any tests that had not resulted by the time you left the emergency department.   Follow-up with your primary doctor in 2-3 days regarding your visit.    Return immediately to the emergency department if you experience any of the following: Difficulty breathing, or any other concerning symptoms.    Thank you for visiting our Emergency Department. It was a pleasure taking care of you today.

## 2023-11-13 LAB — URINE CULTURE: Culture: >=100000 — AB

## 2023-11-14 ENCOUNTER — Telehealth (HOSPITAL_BASED_OUTPATIENT_CLINIC_OR_DEPARTMENT_OTHER): Payer: Self-pay | Admitting: *Deleted

## 2023-11-14 NOTE — Telephone Encounter (Signed)
 Post ED Visit - Positive Culture Follow-up  Culture report reviewed by antimicrobial stewardship pharmacist: [facility] [x]  [place], [place].D. []  Venetia Gully, Pharm.D., BCPS AQ-ID []  Garrel Crews, Pharm.D., BCPS []  Almarie Lunger, Pharm.D., BCPS []  [place], [address].D., BCPS, AAHIVP []  Rosaline Bihari, Pharm.D., BCPS, AAHIVP []  Vernell Meier, PharmD, BCPS []  Latanya Hint, PharmD, BCPS []  Donald Medley, PharmD, BCPS []  Rocky Bold, PharmD []  Dorothyann Alert, PharmD, BCPS []  Morene Babe, PharmD  [facility] []  Rosaline Edison, PharmD []  Romona Bliss, PharmD []  Dolphus Roller, PharmD []  Veva Seip, Rph []  Vernell Daunt) Leonce, PharmD []  Eva Allis, PharmD []  Rosaline Millet, PharmD []  Iantha Batch, PharmD []  Arvin Gauss, PharmD []  Wanda Hasting, PharmD []  Ronal Rav, PharmD []  Rocky Slade, PharmD []  Bard Jeans, PharmD   Positive urine culture Treated with Cephalexin , organism sensitive to the same and no further patient follow-up is required at this time.  Jama Wyman Kipper 11/14/2023, 9:41 AM

## 2023-11-27 DIAGNOSIS — R0902 Hypoxemia: Secondary | ICD-10-CM | POA: Diagnosis not present

## 2023-11-27 DIAGNOSIS — I509 Heart failure, unspecified: Secondary | ICD-10-CM | POA: Diagnosis not present

## 2024-01-26 ENCOUNTER — Other Ambulatory Visit: Payer: Self-pay | Admitting: Cardiology

## 2024-02-03 DIAGNOSIS — I509 Heart failure, unspecified: Secondary | ICD-10-CM | POA: Diagnosis not present

## 2024-02-03 DIAGNOSIS — E782 Mixed hyperlipidemia: Secondary | ICD-10-CM | POA: Diagnosis not present

## 2024-02-03 DIAGNOSIS — E441 Mild protein-calorie malnutrition: Secondary | ICD-10-CM | POA: Diagnosis not present

## 2024-02-03 DIAGNOSIS — N390 Urinary tract infection, site not specified: Secondary | ICD-10-CM | POA: Diagnosis not present

## 2024-02-03 DIAGNOSIS — Z6823 Body mass index (BMI) 23.0-23.9, adult: Secondary | ICD-10-CM | POA: Diagnosis not present

## 2024-02-03 DIAGNOSIS — R7301 Impaired fasting glucose: Secondary | ICD-10-CM | POA: Diagnosis not present

## 2024-02-03 DIAGNOSIS — E7849 Other hyperlipidemia: Secondary | ICD-10-CM | POA: Diagnosis not present

## 2024-02-26 ENCOUNTER — Inpatient Hospital Stay: Payer: PPO | Attending: Hematology

## 2024-02-26 DIAGNOSIS — Z79899 Other long term (current) drug therapy: Secondary | ICD-10-CM | POA: Diagnosis not present

## 2024-02-26 DIAGNOSIS — D472 Monoclonal gammopathy: Secondary | ICD-10-CM | POA: Diagnosis not present

## 2024-02-26 DIAGNOSIS — Z7901 Long term (current) use of anticoagulants: Secondary | ICD-10-CM | POA: Diagnosis not present

## 2024-02-26 DIAGNOSIS — I4891 Unspecified atrial fibrillation: Secondary | ICD-10-CM | POA: Diagnosis not present

## 2024-02-26 DIAGNOSIS — D696 Thrombocytopenia, unspecified: Secondary | ICD-10-CM | POA: Diagnosis not present

## 2024-02-26 LAB — COMPREHENSIVE METABOLIC PANEL WITH GFR
ALT: 9 U/L (ref 0–44)
AST: 16 U/L (ref 15–41)
Albumin: 3.8 g/dL (ref 3.5–5.0)
Alkaline Phosphatase: 63 U/L (ref 38–126)
Anion gap: 9 (ref 5–15)
BUN: 15 mg/dL (ref 8–23)
CO2: 33 mmol/L — ABNORMAL HIGH (ref 22–32)
Calcium: 9.2 mg/dL (ref 8.9–10.3)
Chloride: 95 mmol/L — ABNORMAL LOW (ref 98–111)
Creatinine, Ser: 0.81 mg/dL (ref 0.44–1.00)
GFR, Estimated: >60 mL/min (ref >60)
Glucose, Bld: 105 mg/dL — ABNORMAL HIGH (ref 70–99)
Potassium: 3.6 mmol/L (ref 3.5–5.1)
Sodium: 137 mmol/L (ref 135–145)
Total Bilirubin: 0.9 mg/dL (ref 0.0–1.2)
Total Protein: 6.4 g/dL — ABNORMAL LOW (ref 6.5–8.1)

## 2024-02-26 LAB — CBC WITH DIFFERENTIAL/PLATELET
Abs Immature Granulocytes: 0.02 10*3/uL (ref 0.00–0.07)
Basophils Absolute: 0 10*3/uL (ref 0.0–0.1)
Basophils Relative: 0 %
Eosinophils Absolute: 0.1 10*3/uL (ref 0.0–0.5)
Eosinophils Relative: 1 %
HCT: 43.1 % (ref 36.0–46.0)
Hemoglobin: 13.8 g/dL (ref 12.0–15.0)
Immature Granulocytes: 0 %
Lymphocytes Relative: 27 %
Lymphs Abs: 1.5 10*3/uL (ref 0.7–4.0)
MCH: 29.9 pg (ref 26.0–34.0)
MCHC: 32 g/dL (ref 30.0–36.0)
MCV: 93.3 fL (ref 80.0–100.0)
Monocytes Absolute: 0.5 10*3/uL (ref 0.1–1.0)
Monocytes Relative: 9 %
Neutro Abs: 3.4 10*3/uL (ref 1.7–7.7)
Neutrophils Relative %: 63 %
Platelets: 143 10*3/uL — ABNORMAL LOW (ref 150–400)
RBC: 4.62 MIL/uL (ref 3.87–5.11)
RDW: 13.6 % (ref 11.5–15.5)
WBC: 5.5 10*3/uL (ref 4.0–10.5)
nRBC: 0 % (ref 0.0–0.2)

## 2024-03-01 LAB — KAPPA/LAMBDA LIGHT CHAINS
Kappa free light chain: 18.8 mg/L (ref 3.3–19.4)
Kappa, lambda light chain ratio: 1.22 (ref 0.26–1.65)
Lambda free light chains: 15.4 mg/L (ref 5.7–26.3)

## 2024-03-01 LAB — PROTEIN ELECTROPHORESIS, SERUM
A/G Ratio: 1.5 (ref 0.7–1.7)
Albumin ELP: 3.6 g/dL (ref 2.9–4.4)
Alpha-1-Globulin: 0.3 g/dL (ref 0.0–0.4)
Alpha-2-Globulin: 0.6 g/dL (ref 0.4–1.0)
Beta Globulin: 0.8 g/dL (ref 0.7–1.3)
Gamma Globulin: 0.7 g/dL (ref 0.4–1.8)
Globulin, Total: 2.4 g/dL (ref 2.2–3.9)
M-Spike, %: 0.3 g/dL — ABNORMAL HIGH
Total Protein ELP: 6 g/dL (ref 6.0–8.5)

## 2024-03-04 ENCOUNTER — Inpatient Hospital Stay (HOSPITAL_BASED_OUTPATIENT_CLINIC_OR_DEPARTMENT_OTHER): Payer: PPO | Admitting: Hematology

## 2024-03-04 VITALS — BP 143/80 | HR 70 | Temp 97.5°F | Resp 16 | Wt 126.1 lb

## 2024-03-04 DIAGNOSIS — D696 Thrombocytopenia, unspecified: Secondary | ICD-10-CM | POA: Diagnosis not present

## 2024-03-04 NOTE — Progress Notes (Signed)
 [facility] [address] [place] [address]    Clinic Day:  03/04/2024  Referring physician: Kathyleen Parkins, MD  Patient Care Team: Kathyleen Parkins, MD as PCP - General (Internal Medicine) Gerard Knight, MD as PCP - Cardiology (Cardiology)   ASSESSMENT & PLAN:   Assessment: 1.  Mild thrombocytopenia: -Patient seen at the request of Dr. Lewayne Records for thrombocytopenia. -CBC on 06/26/2020 shows platelet count 129 with normal white count and hemoglobin. -CBC on 07/24/2020 shows platelet count 132 with normal white count and hemoglobin. -She was recently hospitalized for recurrent C. difficile infection.  Her platelet count went down to as low as 75 during hospitalization. -Denies any easy bruising.  No prior history of blood transfusions.   2.  Social/family history: -She lives at home.  Daughter lives with her and helps her. -She is able to do her ADLs. -Never smoker.  Sister had leukemia.    Plan: 1.  Mild thrombocytopenia: - Differential diagnosis includes ITP versus MDS.  Platelet count is mildly low but has been stable.  No further workup needed.  2.  IgM kappa MGUS: - She denies any new onset bone pains. - Reviewed labs from 02/26/2024: Calcium  and creatinine normal.  M spike is 0.3 g and stable.  Free light chain ratio is normal at 1.22. - She does not have any "crab" features.  MGUS has been stable since 2021.  Because of her advanced age, I am not giving her any follow-ups as her MGUS will not likely transformed into myeloma during her lifetime.  I will be glad to see her on an as-needed basis.   3.  Atrial fibrillation: - She is taking Xarelto  without any bleeding issues.  No falls reported.  No orders of the defined types were placed in this encounter.     [name],acting as a Neurosurgeon for Paulett Boros, MD.,have documented all relevant documentation on the behalf of Paulett Boros, MD,as directed by  Paulett Boros, MD  while in the presence of Paulett Boros, MD.  I, Paulett Boros MD, have reviewed the above documentation for accuracy and completeness, and I agree with the above.    Paulett Boros, MD   4/24/20254:43 PM  CHIEF COMPLAINT:   Diagnosis: thrombocytopenia    Cancer Staging  No matching staging information was found for the patient.    Prior Therapy: none  Current Therapy:  observation   HISTORY OF PRESENT ILLNESS:   Oncology History   No history exists.     INTERVAL HISTORY:   Kathryn Marquez is a 89 y.o. female presenting to clinic today for follow up of thrombocytopenia. She was last seen by me on 03/05/23.  Since her last visit, she presented to the ED on 11/09/23 for delirium and was found to have UTI and Covid. Kathryn Marquez was discharged with Keflex  500 mg BID x 5 days.   Today, she states that she is doing well overall. Her appetite level is at 75%. Her energy level is at 25%. She denies any medical issues in the last year, including new onset pains.   Her daughter notes since a UTI a few weeks ago, Kathryn Marquez has not been as active as she used to be and now requires a walker to ambulate. She is s/p an antibiotic x 2 weeks. She drinks plenty of water, has a normal appetite and has a balanced diet.  She is taking Xarelto  as prescribed. She denies any recent falls.   PAST MEDICAL HISTORY:  Past Medical History: Past Medical History:  Diagnosis Date   Atrial fibrillation [facility])    Essential hypertension    History of stroke 04/29/2012   Recurrent Clostridioides difficile diarrhea    Thrombocytopenia (HCC)    TIA (transient ischemic attack)     Surgical History: Past Surgical History:  Procedure Laterality Date   No prior surgery      Social History: Social History   Socioeconomic History   Marital status: Widowed    Spouse name: Not on file   Number of children: Not on file   Years of education: Not on file   Highest education level: Not on file   Occupational History   Not on file  Tobacco Use   Smoking status: Never   Smokeless tobacco: Never  Vaping Use   Vaping status: Never Used  Substance and Sexual Activity   Alcohol use: No   Drug use: No   Sexual activity: Not on file  Other Topics Concern   Not on file  Social History Narrative   Not on file   Social Drivers of Health   Financial Resource Strain: Not on file  Food Insecurity: Not on file  Transportation Needs: Not on file  Physical Activity: Not on file  Stress: Not on file  Social Connections: Not on file  Intimate Partner Violence: Not on file    Family History: Family History  Problem Relation Age of Onset   Stroke Other    Cancer Other    Heart attack Mother    Fibromyalgia Mother    Hypercholesterolemia Mother    Leukemia Sister    Cancer Brother    Stroke Father    Heart attack Brother    Diabetes Son    Fibromyalgia Son    Hypertension Son     Current Medications:  Current Outpatient Medications:    ascorbic acid  (VITAMIN C) 500 MG tablet, Take 500 mg by mouth daily., Disp: , Rfl:    calcium -vitamin D (OSCAL WITH D) 500-200 MG-UNIT tablet, Take 1 tablet by mouth daily with breakfast., Disp: , Rfl:    cetirizine (ZYRTEC) 10 MG tablet, Take 10 mg by mouth in the morning. , Disp: , Rfl:    Cyanocobalamin  (B-12 PO), Take 1 tablet by mouth daily., Disp: , Rfl:    furosemide  (LASIX ) 40 MG tablet, TAKE 1 TABLET BY MOUTH ONCE A DAY., Disp: 90 tablet, Rfl: 3   guaiFENesin -dextromethorphan  (ROBITUSSIN DM) 100-10 MG/5ML syrup, Take 10 mLs by mouth every 4 (four) hours as needed for cough., Disp: 118 mL, Rfl: 0   metoprolol  succinate (TOPROL -XL) 25 MG 24 hr tablet, Take 25 mg by mouth daily., Disp: , Rfl:    Multiple Vitamins-Minerals (VITAMIN D3 COMPLETE PO), Take 1 tablet by mouth daily., Disp: , Rfl:    potassium chloride  (KLOR-CON ) 10 MEQ tablet, TAKE 1 TABLET BY MOUTH ONCE A DAY., Disp: 90 tablet, Rfl: 3   saccharomyces boulardii (FLORASTOR)  250 MG capsule, Take 250 mg by mouth 2 (two) times daily., Disp: , Rfl:    triamcinolone cream (KENALOG) 0.1 %, Apply topically 2 (two) times daily., Disp: , Rfl:    XARELTO  15 MG TABS tablet, Take 15 mg by mouth in the morning. , Disp: , Rfl:    zinc  sulfate 220 (50 Zn) MG capsule, Take 1 capsule (220 mg total) by mouth daily., Disp: 30 capsule, Rfl: 0   Allergies: No Known Allergies  REVIEW OF SYSTEMS:   Review of Systems  Constitutional:  Negative  for chills, fatigue and fever.  HENT:   Negative for lump/mass, mouth sores, nosebleeds, sore throat and trouble swallowing.   Eyes:  Negative for eye problems.  Respiratory:  Negative for cough and shortness of breath.   Cardiovascular:  Negative for chest pain, leg swelling and palpitations.  Gastrointestinal:  Negative for abdominal pain, constipation, diarrhea, nausea and vomiting.  Genitourinary:  Negative for bladder incontinence, difficulty urinating, dysuria, frequency, hematuria and nocturia.   Musculoskeletal:  Negative for arthralgias, back pain, flank pain, myalgias and neck pain.  Skin:  Positive for itching (on right upper back) and rash.  Neurological:  Positive for dizziness and headaches (occasional). Negative for numbness.  Hematological:  Does not bruise/bleed easily.  Psychiatric/Behavioral:  Negative for depression, sleep disturbance and suicidal ideas. The patient is not nervous/anxious.   All other systems reviewed and are negative.    VITALS:   Blood pressure (!) 143/80, pulse 70, temperature (!) 97.5 F (36.4 C), temperature source Oral, resp. rate 16, weight 126 lb 1.7 oz (57.2 kg), SpO2 98%.  Wt Readings from Last 3 Encounters:  03/04/24 126 lb 1.7 oz (57.2 kg)  11/09/23 138 lb 14.2 oz (63 kg)  07/30/23 139 lb 3.2 oz (63.1 kg)    Body mass index is 21.65 kg/m.  Performance status (ECOG): 1 - Symptomatic but completely ambulatory  PHYSICAL EXAM:   Physical Exam Vitals and nursing note reviewed. Exam  conducted with a chaperone present.  Constitutional:      Appearance: Normal appearance.  Cardiovascular:     Rate and Rhythm: Normal rate and regular rhythm.     Pulses: Normal pulses.     Heart sounds: Normal heart sounds.  Pulmonary:     Effort: Pulmonary effort is normal.     Breath sounds: Normal breath sounds.  Abdominal:     Palpations: Abdomen is soft. There is no hepatomegaly, splenomegaly or mass.     Tenderness: There is no abdominal tenderness.  Musculoskeletal:     Right lower leg: No edema.     Left lower leg: No edema.  Lymphadenopathy:     Cervical: No cervical adenopathy.     Right cervical: No superficial, deep or posterior cervical adenopathy.    Left cervical: No superficial, deep or posterior cervical adenopathy.     Upper Body:     Right upper body: No supraclavicular or axillary adenopathy.     Left upper body: No supraclavicular or axillary adenopathy.  Neurological:     General: No focal deficit present.     Mental Status: She is alert and oriented to person, place, and time.  Psychiatric:        Mood and Affect: Mood normal.        Behavior: Behavior normal.     LABS:      Latest Ref Rng & Units 02/26/2024    1:40 PM 11/09/2023    5:07 PM 02/26/2023    1:44 PM  CBC  WBC 4.0 - 10.5 K/uL 5.5  5.2  5.8   Hemoglobin 12.0 - 15.0 g/dL 40.9  81.1  91.4   Hematocrit 36.0 - 46.0 % 43.1  44.0  40.6   Platelets 150 - 400 K/uL 143  110  138       Latest Ref Rng & Units 02/26/2024    1:40 PM 11/09/2023    5:07 PM 02/26/2023    1:44 PM  CMP  Glucose 70 - 99 mg/dL 782  956  96   BUN 8 -  23 mg/dL 15  17  16    Creatinine 0.44 - 1.00 mg/dL 1.61  0.96  0.45   Sodium 135 - 145 mmol/L 137  135  138   Potassium 3.5 - 5.1 mmol/L 3.6  3.5  3.6   Chloride 98 - 111 mmol/L 95  93  97   CO2 22 - 32 mmol/L 33  29  31   Calcium  8.9 - 10.3 mg/dL 9.2  9.3  9.3   Total Protein 6.5 - 8.1 g/dL 6.4  6.9  6.7   Total Bilirubin 0.0 - 1.2 mg/dL 0.9  1.0  1.5   Alkaline  Phos 38 - 126 U/L 63  54  59   AST 15 - 41 U/L 16  22  17    ALT 0 - 44 U/L 9  12  9       No results found for: "CEA1", "CEA" / No results found for: "CEA1", "CEA" No results found for: "PSA1" No results found for: "WUJ811" No results found for: "CAN125"  Lab Results  Component Value Date   TOTALPROTELP 6.0 02/26/2024   ALBUMINELP 3.6 02/26/2024   A1GS 0.3 02/26/2024   A2GS 0.6 02/26/2024   BETS 0.8 02/26/2024   GAMS 0.7 02/26/2024   MSPIKE 0.3 (H) 02/26/2024   SPEI Comment 02/26/2024   Lab Results  Component Value Date   FERRITIN 331 (H) 05/30/2021   FERRITIN 313 (H) 05/29/2021   FERRITIN 290 05/28/2021   Lab Results  Component Value Date   LDH 150 02/26/2022   LDH 152 08/14/2021   LDH 149 05/26/2021     STUDIES:   No results found.

## 2024-03-04 NOTE — Patient Instructions (Signed)
 [facility] at [facility] Discharge Instructions   You were seen and examined today by Dr. Cheree Cords.  He reviewed the results of your lab work which are normal/stable.   We will let you continue your follow-ups with Dr. Lewayne Records. You will not need to see us  in the clinic anymore unless a problem arises.    Return as needed.    Thank you for choosing [facility] at [facility] to provide your oncology and hematology care.  To afford each patient quality time with our provider, please arrive at least 15 minutes before your scheduled appointment time.   If you have a lab appointment with the Cancer Center please come in thru the Main Entrance and check in at the main information desk.  You need to re-schedule your appointment should you arrive 10 or more minutes late.  We strive to give you quality time with our providers, and arriving late affects you and other patients whose appointments are after yours.  Also, if you no show three or more times for appointments you may be dismissed from the clinic at the providers discretion.     Again, thank you for choosing [facility].  Our hope is that these requests will decrease the amount of time that you wait before being seen by our physicians.       _____________________________________________________________  Should you have questions after your visit to [facility], please contact our office at [phone] and follow the prompts.  Our office hours are 8:00 a.m. and 4:30 p.m. Monday - Friday.  Please note that voicemails left after 4:00 p.m. may not be returned until the following business day.  We are closed weekends and major holidays.  You do have access to a nurse 24-7, just call the main number to the clinic [phone] and do not press any options, hold on the line and a nurse will answer the phone.    For prescription refill requests, have your pharmacy contact our  office and allow 72 hours.    Due to Covid, you will need to wear a mask upon entering the hospital. If you do not have a mask, a mask will be given to you at the Main Entrance upon arrival. For doctor visits, patients may have 1 support person age 88 or older with them. For treatment visits, patients can not have anyone with them due to social distancing guidelines and our immunocompromised population.

## 2024-03-18 ENCOUNTER — Ambulatory Visit: Admitting: Cardiology

## 2024-07-21 ENCOUNTER — Encounter: Payer: Self-pay | Admitting: Cardiology

## 2024-07-21 ENCOUNTER — Ambulatory Visit: Attending: Cardiology | Admitting: Cardiology

## 2024-07-21 VITALS — BP 126/60 | HR 62 | Ht 62.0 in | Wt 130.0 lb

## 2024-07-21 DIAGNOSIS — I10 Essential (primary) hypertension: Secondary | ICD-10-CM

## 2024-07-21 DIAGNOSIS — I4821 Permanent atrial fibrillation: Secondary | ICD-10-CM | POA: Diagnosis not present

## 2024-07-21 DIAGNOSIS — I482 Chronic atrial fibrillation, unspecified: Secondary | ICD-10-CM

## 2024-07-21 DIAGNOSIS — I890 Lymphedema, not elsewhere classified: Secondary | ICD-10-CM

## 2024-07-21 NOTE — Progress Notes (Signed)
    Cardiology Office Note  Date: 07/21/2024   ID: Kathryn Marquez, DOB 04-06-1921, MRN [id number]  History of Present Illness: Kathryn Marquez is a 89 y.o. female last seen in April 2024.  She is here today with family member for a follow-up visit.  She celebrated her 89st birthday in August with family.  She does not report any sense of palpitations or chest pain.  She has had no syncope.  Hearing loss has been more prominent.  Leg swelling controlled on current dose of Lasix .  Medications reviewed.  She does not report any bleeding problems on Xarelto .  Heart rate is well-controlled on Toprol -XL.  I reviewed her interval lab work, creatinine clearance 34.  I reviewed her ECG today which shows atrial fibrillation at 63 bpm.  Physical Exam: VS:  BP 126/60 (BP Location: Right Arm, Patient Position: Sitting, Cuff Size: Normal)   Pulse 62   Ht 5' 2 (1.575 m)   Wt 130 lb (59 kg)   SpO2 94%   BMI 23.78 kg/m , BMI Body mass index is 23.78 kg/m.  Wt Readings from Last 3 Encounters:  07/21/24 130 lb (59 kg)  03/04/24 126 lb 1.7 oz (57.2 kg)  11/09/23 138 lb 14.2 oz (63 kg)    General: Patient appears comfortable at rest. HEENT: Conjunctiva and lids normal. Neck: Supple, no elevated JVP or carotid bruits. Lungs: Clear to auscultation, nonlabored breathing at rest. Cardiac: Irregularly irregular, soft systolic murmur, no gallop. Extremities: Mild lower leg edema.  ECG:  An ECG dated 10/09/2022 was personally reviewed today and demonstrated:  Rate controlled atrial fibrillation with left posterior fascicular block and nonspecific ST-T changes.  Labwork: March 2025: Cholesterol 148, triglycerides 70, HDL 86, LDL 48 02/26/2024: ALT 9; AST 16; BUN 15; Creatinine, Ser 0.81; Hemoglobin 13.8; Platelets 143; Potassium 3.6; Sodium 137   Other Studies Reviewed Today:  No interval cardiac testing for review today.  Assessment and Plan:  1.  Permanent atrial fibrillation with CHA2DS2-VASc  score of 7.  She does not describe any sense of palpitations and continues on Toprol -XL 25 mg daily for heart rate control.  ECG reviewed.  She remains on Xarelto  15 mg daily for stroke prophylaxis.  Creatinine clearance is 34.  No spontaneous bleeding problems reported.   2.  Leg edema/lymphedema.  Continue Lasix  40 mg daily with potassium supplement.   3.  Primary hypertension.  Blood pressure is well-controlled today.  No change in current regimen.  Disposition:  Follow up 6 months.  Signed, Jayson JUDITHANN Sierras, M.D., F.A.C.C. [facility] at [facility]

## 2024-07-21 NOTE — Patient Instructions (Signed)
 Medication Instructions:  Your physician recommends that you continue on your current medications as directed. Please refer to the Current Medication list given to you today.   Labwork: None today  Testing/Procedures: None today  Follow-Up: 6 months Dr.Mcdowell  Any Other Special Instructions Will Be Listed Below (If Applicable).  If you need a refill on your cardiac medications before your next appointment, please call your pharmacy.

## 2024-08-25 ENCOUNTER — Encounter (HOSPITAL_COMMUNITY): Payer: Self-pay

## 2024-08-25 ENCOUNTER — Observation Stay (HOSPITAL_COMMUNITY)
Admission: EM | Admit: 2024-08-25 | Discharge: 2024-08-28 | Disposition: A | Discharge status: Skilled Nursing Facility | Attending: Hospitalist | Admitting: Hospitalist

## 2024-08-25 ENCOUNTER — Emergency Department (HOSPITAL_COMMUNITY)

## 2024-08-25 ENCOUNTER — Other Ambulatory Visit: Payer: Self-pay

## 2024-08-25 DIAGNOSIS — N390 Urinary tract infection, site not specified: Secondary | ICD-10-CM | POA: Diagnosis not present

## 2024-08-25 DIAGNOSIS — S8392XA Sprain of unspecified site of left knee, initial encounter: Secondary | ICD-10-CM

## 2024-08-25 DIAGNOSIS — M25552 Pain in left hip: Secondary | ICD-10-CM | POA: Insufficient documentation

## 2024-08-25 DIAGNOSIS — I10 Essential (primary) hypertension: Secondary | ICD-10-CM | POA: Diagnosis not present

## 2024-08-25 DIAGNOSIS — W19XXXA Unspecified fall, initial encounter: Principal | ICD-10-CM

## 2024-08-25 DIAGNOSIS — Z8673 Personal history of transient ischemic attack (TIA), and cerebral infarction without residual deficits: Secondary | ICD-10-CM | POA: Diagnosis not present

## 2024-08-25 DIAGNOSIS — L03012 Cellulitis of left finger: Secondary | ICD-10-CM | POA: Diagnosis not present

## 2024-08-25 DIAGNOSIS — M25562 Pain in left knee: Secondary | ICD-10-CM | POA: Diagnosis not present

## 2024-08-25 DIAGNOSIS — W19XXXD Unspecified fall, subsequent encounter: Secondary | ICD-10-CM

## 2024-08-25 DIAGNOSIS — I6789 Other cerebrovascular disease: Secondary | ICD-10-CM | POA: Insufficient documentation

## 2024-08-25 DIAGNOSIS — W1811XA Fall from or off toilet without subsequent striking against object, initial encounter: Secondary | ICD-10-CM | POA: Insufficient documentation

## 2024-08-25 DIAGNOSIS — Z7901 Long term (current) use of anticoagulants: Secondary | ICD-10-CM | POA: Insufficient documentation

## 2024-08-25 DIAGNOSIS — I482 Chronic atrial fibrillation, unspecified: Secondary | ICD-10-CM | POA: Diagnosis present

## 2024-08-25 DIAGNOSIS — S0990XA Unspecified injury of head, initial encounter: Secondary | ICD-10-CM | POA: Insufficient documentation

## 2024-08-25 DIAGNOSIS — Z23 Encounter for immunization: Secondary | ICD-10-CM | POA: Diagnosis not present

## 2024-08-25 DIAGNOSIS — S80919A Unspecified superficial injury of unspecified knee, initial encounter: Secondary | ICD-10-CM | POA: Diagnosis not present

## 2024-08-25 MED ORDER — OXYCODONE-ACETAMINOPHEN 5-325 MG PO TABS
1.0000 | ORAL_TABLET | Freq: Once | ORAL | Status: AC
  Administered 2024-08-25: 1 via ORAL
  Filled 2024-08-25: qty 1

## 2024-08-25 NOTE — ED Triage Notes (Signed)
 Pt fell getting on toilet and hit left knee on trash can. 8/10 pain.

## 2024-08-25 NOTE — ED Provider Notes (Signed)
 Patient presenting via EMS from home after mechanical fall.  While attempting to use the bathroom, she had an abnormal twisting movement on her left leg.  This caused her to fall to the left side.  She did strike a trash can.  She has since had pain in left hip and left knee.  She states that she did strike her head.  She is on Xarelto .  Percocet ordered for analgesia.  Imaging studies ordered.  Care of patient to be assumed by oncoming ED provider.   Melvenia Motto, MD 08/25/24 [phone]

## 2024-08-25 NOTE — ED Provider Notes (Signed)
 [place] EMERGENCY DEPARTMENT AT [facility]  Provider Note  CSN: [id number] Arrival date & time: 08/25/24 2240  History Chief Complaint  Patient presents with   Kathryn Marquez is a 89 y.o. female with history of afib on Xarelto  brought to the ED via EMS from home where she lives with her daughter. She still gets around fairly independently with her walker and reports she lost her balance and fell while trying to use the bathroom tonight. She does not think she hit her head, but she landed on a trash can and injured her L leg. Complaining of pain from mid-thigh to knee, worse with movement.    Home Medications Prior to Admission medications   Medication Sig Start Date End Date Taking? Authorizing Provider  ascorbic acid  (VITAMIN C) 500 MG tablet Take 500 mg by mouth daily.    [provider]  calcium -vitamin D (OSCAL WITH D) 500-200 MG-UNIT tablet Take 1 tablet by mouth daily with breakfast.    [provider]  cetirizine (ZYRTEC) 10 MG tablet Take 10 mg by mouth in the morning.     [provider]  Cyanocobalamin  (B-12 PO) Take 1 tablet by mouth daily.    [provider]  furosemide  (LASIX ) 40 MG tablet TAKE 1 TABLET BY MOUTH ONCE A DAY. 01/26/24   Debera Jayson MATSU, MD  guaiFENesin -dextromethorphan  (ROBITUSSIN DM) 100-10 MG/5ML syrup Take 10 mLs by mouth every 4 (four) hours as needed for cough. 05/30/21   Marquez, Clanford L, MD  metoprolol  succinate (TOPROL -XL) 25 MG 24 hr tablet Take 25 mg by mouth daily.    [provider]  Multiple Vitamins-Minerals (VITAMIN D3 COMPLETE PO) Take 1 tablet by mouth daily.    [provider]  potassium chloride  (KLOR-CON ) 10 MEQ tablet TAKE 1 TABLET BY MOUTH ONCE A DAY. 01/26/24   Debera Jayson MATSU, MD  saccharomyces boulardii (FLORASTOR) 250 MG capsule Take 250 mg by mouth 2 (two) times daily.    [provider]  triamcinolone cream (KENALOG) 0.1 % Apply topically 2  (two) times daily. 02/26/22   [provider]  XARELTO  15 MG TABS tablet Take 15 mg by mouth in the morning.  12/14/19   [provider]  zinc  sulfate 220 (50 Zn) MG capsule Take 1 capsule (220 mg total) by mouth daily. 05/31/21   Vicci Afton CROME, MD     Allergies    Patient has no known allergies.   Review of Systems   Review of Systems Please see HPI for pertinent positives and negatives  Physical Exam BP (!) 149/90   Pulse 89   Temp 97.6 F (36.4 C) (Oral)   Resp 18   Ht 5' 2 (1.575 m)   Wt 59 kg   SpO2 95%   BMI 23.79 kg/m   Physical Exam Vitals and nursing note reviewed.  Constitutional:      Appearance: Normal appearance.  HENT:     Head: Normocephalic and atraumatic.     Nose: Nose normal.     Mouth/Throat:     Mouth: Mucous membranes are moist.  Eyes:     Extraocular Movements: Extraocular movements intact.     Conjunctiva/sclera: Conjunctivae normal.  Cardiovascular:     Rate and Rhythm: Normal rate.     Pulses: Normal pulses.  Pulmonary:     Effort: Pulmonary effort is normal.     Breath sounds: Normal breath sounds.  Abdominal:     General:  Abdomen is flat.     Palpations: Abdomen is soft.     Tenderness: There is no abdominal tenderness. There is no guarding.  Musculoskeletal:        General: Tenderness (L thigh/knee) present. No swelling or deformity.     Cervical back: Neck supple. No tenderness.  Skin:    General: Skin is warm and dry.  Neurological:     General: No focal deficit present.     Mental Status: She is alert.  Psychiatric:        Mood and Affect: Mood normal.     ED Results / Procedures / Treatments   EKG None  Procedures Procedures  Medications Ordered in the ED Medications  oxyCODONE -acetaminophen  (PERCOCET/ROXICET) 5-325 MG per tablet 1 tablet (1 tablet Oral Given 08/25/24 2254)    Initial Impression and Plan  Patient here after mechanical fall with L leg injury. She is on anticoagulation, will  check basic labs and imaging. Pain medication given for comfort.   ED Course       MDM Rules/Calculators/A&P Medical Decision Making Amount and/or Complexity of Data Reviewed Labs: ordered.     Final Clinical Impression(s) / ED Diagnoses Final diagnoses:  None    Rx / DC Orders ED Discharge Orders     None

## 2024-08-26 ENCOUNTER — Emergency Department (HOSPITAL_COMMUNITY)

## 2024-08-26 DIAGNOSIS — Y92009 Unspecified place in unspecified non-institutional (private) residence as the place of occurrence of the external cause: Secondary | ICD-10-CM

## 2024-08-26 DIAGNOSIS — M25562 Pain in left knee: Secondary | ICD-10-CM

## 2024-08-26 DIAGNOSIS — I10 Essential (primary) hypertension: Secondary | ICD-10-CM | POA: Diagnosis not present

## 2024-08-26 DIAGNOSIS — I482 Chronic atrial fibrillation, unspecified: Secondary | ICD-10-CM | POA: Diagnosis not present

## 2024-08-26 DIAGNOSIS — L03012 Cellulitis of left finger: Secondary | ICD-10-CM

## 2024-08-26 DIAGNOSIS — W19XXXA Unspecified fall, initial encounter: Secondary | ICD-10-CM

## 2024-08-26 DIAGNOSIS — W19XXXD Unspecified fall, subsequent encounter: Secondary | ICD-10-CM

## 2024-08-26 LAB — CBC WITH DIFFERENTIAL/PLATELET
Abs Immature Granulocytes: 0.07 K/uL (ref 0.00–0.07)
Basophils Absolute: 0 K/uL (ref 0.0–0.1)
Basophils Relative: 0 %
Eosinophils Absolute: 0 K/uL (ref 0.0–0.5)
Eosinophils Relative: 0 %
HCT: 40.5 % (ref 36.0–46.0)
Hemoglobin: 13.2 g/dL (ref 12.0–15.0)
Immature Granulocytes: 1 %
Lymphocytes Relative: 9 %
Lymphs Abs: 1 K/uL (ref 0.7–4.0)
MCH: 30.1 pg (ref 26.0–34.0)
MCHC: 32.6 g/dL (ref 30.0–36.0)
MCV: 92.3 fL (ref 80.0–100.0)
Monocytes Absolute: 0.6 K/uL (ref 0.1–1.0)
Monocytes Relative: 6 %
Neutro Abs: 9.9 K/uL — ABNORMAL HIGH (ref 1.7–7.7)
Neutrophils Relative %: 84 %
Platelets: 117 K/uL — ABNORMAL LOW (ref 150–400)
RBC: 4.39 MIL/uL (ref 3.87–5.11)
RDW: 13.6 % (ref 11.5–15.5)
WBC: 11.6 K/uL — ABNORMAL HIGH (ref 4.0–10.5)
nRBC: 0 % (ref 0.0–0.2)

## 2024-08-26 LAB — BASIC METABOLIC PANEL WITH GFR
Anion gap: 11 (ref 5–15)
BUN: 16 mg/dL (ref 8–23)
CO2: 30 mmol/L (ref 22–32)
Calcium: 9.7 mg/dL (ref 8.9–10.3)
Chloride: 97 mmol/L — ABNORMAL LOW (ref 98–111)
Creatinine, Ser: 1.03 mg/dL — ABNORMAL HIGH (ref 0.44–1.00)
GFR, Estimated: 48 mL/min — ABNORMAL LOW (ref >60)
Glucose, Bld: 128 mg/dL — ABNORMAL HIGH (ref 70–99)
Potassium: 3.7 mmol/L (ref 3.5–5.1)
Sodium: 138 mmol/L (ref 135–145)

## 2024-08-26 LAB — URINALYSIS, COMPLETE (UACMP) WITH MICROSCOPIC
Bilirubin Urine: NEGATIVE
Glucose, UA: NEGATIVE mg/dL
Ketones, ur: NEGATIVE mg/dL
Nitrite: NEGATIVE
Protein, ur: NEGATIVE mg/dL
Specific Gravity, Urine: 1.014 (ref 1.005–1.030)
pH: 5 (ref 5.0–8.0)

## 2024-08-26 LAB — PHOSPHORUS: Phosphorus: 3.1 mg/dL (ref 2.5–4.6)

## 2024-08-26 LAB — MAGNESIUM: Magnesium: 2.2 mg/dL (ref 1.7–2.4)

## 2024-08-26 MED ORDER — ONDANSETRON HCL 4 MG/2ML IJ SOLN: 4.0000 mg | Freq: Four times a day (QID) | INTRAMUSCULAR | Status: DC | PRN

## 2024-08-26 MED ORDER — CEPHALEXIN 500 MG PO CAPS
500.0000 mg | ORAL_CAPSULE | Freq: Four times a day (QID) | ORAL | Status: DC
  Administered 2024-08-26 – 2024-08-28 (×10): 500 mg via ORAL
  Filled 2024-08-26 (×10): qty 1

## 2024-08-26 MED ORDER — PROCHLORPERAZINE EDISYLATE 10 MG/2ML IJ SOLN: 10.0000 mg | Freq: Four times a day (QID) | INTRAMUSCULAR | Status: DC | PRN

## 2024-08-26 MED ORDER — FUROSEMIDE 40 MG PO TABS
40.0000 mg | ORAL_TABLET | Freq: Every day | ORAL | Status: DC
  Administered 2024-08-26 – 2024-08-28 (×3): 40 mg via ORAL
  Filled 2024-08-26 (×3): qty 1

## 2024-08-26 MED ORDER — POTASSIUM CHLORIDE CRYS ER 10 MEQ PO TBCR
10.0000 meq | EXTENDED_RELEASE_TABLET | Freq: Every day | ORAL | Status: DC
  Administered 2024-08-26 – 2024-08-28 (×3): 10 meq via ORAL
  Filled 2024-08-26 (×3): qty 1

## 2024-08-26 MED ORDER — OXYCODONE-ACETAMINOPHEN 5-325 MG PO TABS
1.0000 | ORAL_TABLET | ORAL | Status: DC | PRN
  Administered 2024-08-26 – 2024-08-27 (×5): 1 via ORAL
  Filled 2024-08-26 (×5): qty 1

## 2024-08-26 MED ORDER — DOXYCYCLINE HYCLATE 100 MG PO TABS
100.0000 mg | ORAL_TABLET | Freq: Once | ORAL | Status: AC
  Administered 2024-08-26: 100 mg via ORAL
  Filled 2024-08-26: qty 1

## 2024-08-26 MED ORDER — INFLUENZA VAC SPLIT HIGH-DOSE 0.5 ML IM SUSY
0.5000 mL | PREFILLED_SYRINGE | INTRAMUSCULAR | Status: AC
Start: 2024-08-27 — End: ?
  Administered 2024-08-28: 0.5 mL via INTRAMUSCULAR
  Filled 2024-08-26: qty 0.5

## 2024-08-26 MED ORDER — POTASSIUM CHLORIDE ER 10 MEQ PO TBCR
10.0000 meq | EXTENDED_RELEASE_TABLET | Freq: Every day | ORAL | Status: DC
  Filled 2024-08-26 (×2): qty 1

## 2024-08-26 MED ORDER — METOPROLOL SUCCINATE ER 25 MG PO TB24
25.0000 mg | ORAL_TABLET | Freq: Every day | ORAL | Status: DC
  Administered 2024-08-28: 25 mg via ORAL
  Filled 2024-08-26: qty 1

## 2024-08-26 MED ORDER — ACETAMINOPHEN 650 MG RE SUPP: 650.0000 mg | Freq: Four times a day (QID) | RECTAL | Status: DC | PRN

## 2024-08-26 MED ORDER — RIVAROXABAN 15 MG PO TABS
15.0000 mg | ORAL_TABLET | Freq: Every morning | ORAL | Status: DC
  Administered 2024-08-27 – 2024-08-28 (×2): 15 mg via ORAL
  Filled 2024-08-26 (×2): qty 1

## 2024-08-26 MED ORDER — ACETAMINOPHEN 325 MG PO TABS: 650.0000 mg | ORAL_TABLET | Freq: Four times a day (QID) | ORAL | Status: DC | PRN

## 2024-08-26 MED ORDER — ONDANSETRON HCL 4 MG PO TABS: 4.0000 mg | ORAL_TABLET | Freq: Four times a day (QID) | ORAL | Status: DC | PRN

## 2024-08-26 NOTE — Progress Notes (Signed)
 PROGRESS NOTE    Kathryn Marquez  FMW:[id number] DOB: 13-Apr-1921 DOA: 08/25/2024 PCP: Pcp, No   Brief Narrative:  Kathryn Marquez is a 89 y.o. female with medical history significant of chronic atrial fibrillation on Xarelto , hypertension, thrombocytopenia and prior ischemic stroke who presents to the emergency department accompanied by daughter and son due to fall sustained at home.  Patient lives with daughter and she ambulates with walker fairly independently.  She went to the bathroom tonight and while trying to use the bathroom she lost balance and fell landing on a trash can next to the commode thereby injuring her left leg and now complaining of left knee to distal thigh pain which worsens on movement.  She denies hitting her head.   Extensive evaluation in the ER included x-ray of hip, lower extremity negative for acute fracture or dislocation.  CT of the head, CT cervical spine also negative.  CT of the hip, knee was also done rules out occult fracture.  Patient admitted due to ongoing pain and ambulation deficits.  Assessment & Plan:   Principal Problem:   Fall at home, initial encounter Active Problems:   Essential hypertension   Atrial fibrillation, chronic (HCC)   Left knee pain   Cellulitis of left little finger   Fall at home, initial encounter Left knee pain Continue Percocet as needed Concern for progression Continue PT/OT eval and treat   Cellulitis of left little finger Patient was treated with doxycycline, we shall continue with Keflex   Concern for UTI: Granddaughter requested UA, sample sent.  She is already on Abx as above   Chronic atrial fibrillation Continue metoprolol , Xarelto    Essential hypertension Continue metoprolol , Lasix  Continue Klor-Con        DVT prophylaxis: Xarelto    Code Status: DNR   Family Communication:    Consults: None   Severity of Illness: The appropriate patient status for this patient is OBSERVATION. Observation status  is judged to be reasonable and necessary in order to provide the required intensity of service to ensure the patient's safety. The patient's presenting symptoms, physical exam findings, and initial radiographic and laboratory data in the context of their medical condition is felt to place them at decreased risk for further clinical deterioration. Furthermore, it is anticipated that the patient will be medically stable for discharge from the hospital within 2 midnights of admission.       Subjective: Patient seen at the bedside.  She reported some pain in her left leg.  Was alert and oriented x 2 on my visit.  Vital signs are stable.  Granddaughter in law requested UA which is ordered.  Of note she is already on antibiotics for cellulitis on her finger  Objective: Vitals:   08/26/24 0500 08/26/24 0645 08/26/24 0700 08/26/24 0732  BP: 121/89 (!) 147/72 (!) 156/72 (!) 147/76  Pulse: 79 70 71 86  Resp:    18  Temp:    98.5 F (36.9 C)  TempSrc:    Oral  SpO2: 93% 93% 93% 94%  Weight:      Height:       No intake or output data in the 24 hours ending 08/26/24 0824 Filed Weights   08/25/24 2245  Weight: 59 kg    Examination:  General: Elderly female not in any acute distress HEENT: Moist oral mucosa Chest: Clear to auscultation bilaterally, no wheezes CVS: S1, S2, murmur present Abdomen: Soft, nontender Extremities: Left lower extremity in knee brace.  Data Reviewed: I have personally  reviewed following labs and imaging studies  CBC: Recent Labs  Lab 08/25/24 0115  WBC 11.6*  NEUTROABS 9.9*  HGB 13.2  HCT 40.5  MCV 92.3  PLT 117*   Basic Metabolic Panel: Recent Labs  Lab 08/25/24 0115 08/26/24 0115  NA 138  --   K 3.7  --   CL 97*  --   CO2 30  --   GLUCOSE 128*  --   BUN 16  --   CREATININE 1.03*  --   CALCIUM  9.7  --   MG  --  2.2  PHOS  --  3.1   GFR: Estimated Creatinine Clearance: 22.4 mL/min (A) (by C-G formula based on SCr of 1.03 mg/dL (H)). Liver  Function Tests: No results for input(s): AST, ALT, ALKPHOS, BILITOT, PROT, ALBUMIN in the last 168 hours. No results for input(s): LIPASE, AMYLASE in the last 168 hours. No results for input(s): AMMONIA in the last 168 hours. Coagulation Profile: No results for input(s): INR, PROTIME in the last 168 hours. Cardiac Enzymes: No results for input(s): CKTOTAL, CKMB, CKMBINDEX, TROPONINI in the last 168 hours. BNP (last 3 results) No results for input(s): PROBNP in the last 8760 hours. HbA1C: No results for input(s): HGBA1C in the last 72 hours. CBG: No results for input(s): GLUCAP in the last 168 hours. Lipid Profile: No results for input(s): CHOL, HDL, LDLCALC, TRIG, CHOLHDL, LDLDIRECT in the last 72 hours. Thyroid  Function Tests: No results for input(s): TSH, T4TOTAL, FREET4, T3FREE, THYROIDAB in the last 72 hours. Anemia Panel: No results for input(s): VITAMINB12, FOLATE, FERRITIN, TIBC, IRON, RETICCTPCT in the last 72 hours. Sepsis Labs: No results for input(s): PROCALCITON, LATICACIDVEN in the last 168 hours.  No results found for this or any previous visit (from the past 240 hours).       Radiology Studies: CT Knee Left Wo Contrast Result Date: 08/26/2024 EXAM: CT LEFT KNEE, WITHOUT IV CONTRAST 08/26/2024 02:15:53 AM TECHNIQUE: Axial images were acquired through the left knee without IV contrast. Reformatted images were reviewed. Automated exposure control, iterative reconstruction, and/or weight based adjustment of the mA/kV was utilized to reduce the radiation dose to as low as reasonably achievable. COMPARISON: Left knee radiographs earlier today. CLINICAL HISTORY: Knee trauma, occult fracture suspected, xray done. Pt fell getting on toilet and hit left knee on trash can. 8/10 pain. FINDINGS: BONES: No fracture is seen. JOINTS: No dislocation. The joint spaces are normal. Mild degenerative changes. No  suprapatellar joint effusion. SOFT TISSUES: The soft tissues are unremarkable. IMPRESSION: 1. No acute osseous abnormality. Electronically signed by: Pinkie Pebbles MD 08/26/2024 02:25 AM EDT RP Workstation: [id number]   CT PELVIS WO CONTRAST Result Date: 08/26/2024 EXAM: CT Pelvis, Without IV Contrast 08/26/2024 02:15:53 AM TECHNIQUE: Axial images were acquired through the pelvis without IV contrast. Reformatted images were reviewed. Automated exposure control, iterative reconstruction, and/or weight based adjustment of the mA/kV was utilized to reduce the radiation dose to as low as reasonably achievable. COMPARISON: Right hip radiograph dated 08/26/2024. CLINICAL HISTORY: Hip trauma, fracture suspected, xray done. Pt fell getting on toilet and hit left knee on trash can. 8/10 pain. FINDINGS: BONES: No fracture is seen. The visualized bony pelvis appears intact. JOINTS: Bilateral hip joint spaces are preserved. No dislocation. SOFT TISSUES: Lobulated fluid density lesion in the right inguinal canal, chronic/benign. INTRAPELVIC CONTENTS: Known large exophytic right lower pole cyst incompletely visualized. Sigmoid diverticulosis, without evidence of diverticulitis. IMPRESSION: 1. No acute osseous abnormality. Electronically signed by: Pinkie Pebbles MD 08/26/2024  02:24 AM EDT RP Workstation: [id number]   CT Head Wo Contrast Result Date: 08/26/2024 CLINICAL DATA:  Fall EXAM: CT HEAD WITHOUT CONTRAST TECHNIQUE: Contiguous axial images were obtained from the base of the skull through the vertex without intravenous contrast. RADIATION DOSE REDUCTION: This exam was performed according to the departmental dose-optimization program which includes automated exposure control, adjustment of the mA and/or kV according to patient size and/or use of iterative reconstruction technique. COMPARISON:  11/09/2023 FINDINGS: Brain: There is atrophy and chronic small vessel disease changes. There is atrophy and chronic small  vessel disease changes. Old infarcts in the left frontal, parietal and occipital lobes. No acute intracranial abnormality. Specifically, no hemorrhage, hydrocephalus, mass lesion, acute infarction, or significant intracranial injury. Vascular: No hyperdense vessel or unexpected calcification. Skull: No acute calvarial abnormality. Sinuses/Orbits: No acute findings Other: None IMPRESSION: Old infarcts in the left cerebral hemisphere as above. Atrophy, chronic microvascular disease. No acute intracranial abnormality. Electronically Signed   By: Franky Crease M.D.   On: 08/26/2024 00:41   CT Cervical Spine Wo Contrast Result Date: 08/26/2024 CLINICAL DATA:  Fall. EXAM: CT CERVICAL SPINE WITHOUT CONTRAST TECHNIQUE: Multidetector CT imaging of the cervical spine was performed without intravenous contrast. Multiplanar CT image reconstructions were also generated. RADIATION DOSE REDUCTION: This exam was performed according to the departmental dose-optimization program which includes automated exposure control, adjustment of the mA and/or kV according to patient size and/or use of iterative reconstruction technique. COMPARISON:  None Available. FINDINGS: Alignment: Normal Skull base and vertebrae: No acute fracture. No primary bone lesion or focal pathologic process. Soft tissues and spinal canal: No prevertebral fluid or swelling. No visible canal hematoma. Disc levels: Anterior spurring throughout the cervical spine. Mild bilateral degenerative facet disease. Upper chest: No acute findings Other: None IMPRESSION: No acute bony abnormality. Electronically Signed   By: Franky Crease M.D.   On: 08/26/2024 00:39   DG Chest Portable 1 View Result Date: 08/26/2024 CLINICAL DATA:  Fall EXAM: PORTABLE CHEST 1 VIEW COMPARISON:  11/09/2023 FINDINGS: Cardiomegaly, stable. Aortic atherosclerosis. Left pleural calcifications again noted, unchanged. No acute confluent airspace opacities. No effusions. IMPRESSION: Cardiomegaly.  Chronic changes in the left lung with pleural calcifications. No acute cardiopulmonary disease. Electronically Signed   By: Franky Crease M.D.   On: 08/26/2024 00:38   DG Knee 2 Views Left Result Date: 08/26/2024 CLINICAL DATA:  Fall, left knee pain EXAM: LEFT KNEE - 1-2 VIEW COMPARISON:  None Available. FINDINGS: Early joint space narrowing. No joint effusion. No acute bony abnormality. Specifically, no fracture, subluxation, or dislocation. IMPRESSION: Negative. Electronically Signed   By: Franky Crease M.D.   On: 08/26/2024 00:37   DG Hip Unilat W or Wo Pelvis 2-3 Views Left Result Date: 08/26/2024 CLINICAL DATA:  Fall, left hip pain EXAM: DG HIP (WITH OR WITHOUT PELVIS) 2-3V LEFT COMPARISON:  None Available. FINDINGS: There is no evidence of hip fracture or dislocation. There is no evidence of arthropathy or other focal bone abnormality. IMPRESSION: Negative. Electronically Signed   By: Franky Crease M.D.   On: 08/26/2024 00:37        Scheduled Meds:  cephALEXin   500 mg Oral Q6H   Continuous Infusions:        Derryl Duval, MD [facility] 08/26/2024, 8:24 AM

## 2024-08-26 NOTE — ED Notes (Signed)
 family called this nurse in the room to look at something, she has a spot on her left pinky and its red inflammed looking with a white bump on it. She said she's been putting a salve on unaware what salve but it does look wet in appearance and she said it was tender/painful

## 2024-08-26 NOTE — ED Notes (Signed)
 Call [name] Her Daughter When Her Mother gets room

## 2024-08-26 NOTE — TOC Initial Note (Signed)
 Transition of Care [facility]) - Initial/Assessment Note    Patient Details  Name: Kathryn Marquez MRN: [id number] Date of Birth: Feb 28, 1921  [facility] CM/SW Contact:    Noreen KATHEE Cleotilde ISRAEL Phone Number: 08/26/2024, 11:57 AM  Clinical Narrative:                   CSW spoke with patient daughter earlier about PT recommendation for SNF. Daughter stated that she would call CSW back after she discussed it with her brother. Patient lives with daughter, uses a walker , and is independent with bathing and dressing. Daughter is very supportive of patient needs.   Addendum 11:55 AM   Daughter called CSW back and asked if patient referral could be sent to [facility] for short-term rehab. Referral was sent via HUB.CSW will continue to follow.    Expected Discharge Plan: Skilled Nursing Facility Barriers to Discharge: Continued Medical Work up   Patient Goals and CMS Choice Patient states their goals for this hospitalization and ongoing recovery are:: get better CMS Medicare.gov Compare Post Acute Care list provided to:: Patient Represenative (must comment) (Daughter- Kathryn Marquez) Choice offered to / list presented to : Adult Children      Expected Discharge Plan and Services In-house Referral: Clinical Social Work   Post Acute Care Choice: Durable Medical Equipment Living arrangements for the past 2 months: Single Family Home                                      Prior Living Arrangements/Services Living arrangements for the past 2 months: Single Family Home Lives with:: Adult Children Patient language and need for interpreter reviewed:: Yes Do you feel safe going back to the place where you live?: Yes      Need for Family Participation in Patient Care: Yes (Comment) Care giver support system in place?: No (comment) Current home services: DME Criminal Activity/Legal Involvement Pertinent to Current Situation/Hospitalization: No - Comment as needed  Activities of Daily  Living      Permission Sought/Granted      Share Information with NAME: Kathryn Marquez     Permission granted to share info w Relationship: Daughter     Emotional Assessment Appearance:: Appears stated age     Orientation: : Oriented to Self Alcohol / Substance Use: Not Applicable Psych Involvement: No (comment)  Admission diagnosis:  Fall at home, initial encounter [W19.[email or web address], Y92.009] Patient Active Problem List   Diagnosis Date Noted   Fall at home, initial encounter 08/26/2024   Left knee pain 08/26/2024   Cellulitis of left little finger 08/26/2024   Pressure injury of skin 05/30/2021   COVID-19 05/27/2021   COVID-19 virus infection 05/26/2021   Elevated brain natriuretic peptide (BNP) level 05/26/2021   Prolonged QT interval 05/26/2021   Thrombocytopenia 05/26/2021   Chronic respiratory failure with hypoxia (HCC) 05/26/2021   Recurrent Clostridioides difficile diarrhea 06/11/2020   Acute metabolic encephalopathy 06/10/2020   Dehydration 05/09/2020   Colitis 05/08/2020   C. 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 04/29/2012   Essential hypertension  04/29/2012   Atrial fibrillation, chronic (HCC) 04/29/2012   CLOSED COLLES FRACTURE 08/02/2010   PCP:  Freddrick Johns Pharmacy:   [facility] - [place], [place] - [address] [place] [address] Phone: [phone] Fax: [phone]     Social Drivers of Health (SDOH) Social History: SDOH Screenings   Tobacco Use: Low Risk  (08/25/2024)   SDOH Interventions:     Readmission Risk Interventions     No data to display

## 2024-08-26 NOTE — ED Notes (Signed)
 Pt has been undressed and placed in a gown. Changed and placed in a clean brief and purewick in place. Pt is resting comfortably at this time.

## 2024-08-26 NOTE — H&P (Signed)
 History and Physical    Patient: Kathryn Marquez FMW:[id number] DOB: 1921/01/23 DOA: 08/25/2024 DOS: the patient was seen and examined on 08/26/2024 PCP: Pcp, No  Patient coming from: Home  Chief Complaint:  Chief Complaint  Patient presents with   Fall   HPI: Kathryn Marquez is a 89 y.o. female with medical history significant of chronic atrial fibrillation on Xarelto , hypertension, thrombocytopenia and prior ischemic stroke who presents to the emergency department accompanied by daughter and son due to fall sustained at home.  Patient lives with daughter and she ambulates with walker fairly independently.  She went to the bathroom tonight and while trying to use the bathroom she lost balance and fell landing on a trash can next to the commode thereby injuring her left leg and now complaining of left knee to distal thigh pain which worsens on movement.  She denies hitting her head.  ED Course:  In the emergency department, BP was 162/78, other vital signs were within normal range.  Workup in the ED showed normal CBC except for WBC of 11.2.  BMP was normal except for chloride of 97, blood glucose 128, creatinine 1.03 (baseline creatinine at 0.8-0.9). CT of left knee and pelvis showed no acute osseous abnormality CT head without contrast showed no acute intracranial abnormality CT cervical spine without contrast showed no acute bony abnormality Chest x-ray showed no acute cardiopulmonary disease Patient was treated with Percocet.  Doxycycline was given due to the lesion in the left fifth finger.   Review of Systems: Review of systems as noted in the HPI. All other systems reviewed and are negative.   Past Medical History:  Diagnosis Date   Atrial fibrillation [facility]    Essential hypertension    History of stroke 04/29/2012   Recurrent Clostridioides difficile diarrhea    Thrombocytopenia    TIA (transient ischemic attack)    Past Surgical History:  Procedure Laterality Date   No  prior surgery      Social History:  reports that she has never smoked. She has never used smokeless tobacco. She reports that she does not drink alcohol and does not use drugs.   No Known Allergies  Family History  Problem Relation Age of Onset   Stroke Other    Cancer Other    Heart attack Mother    Fibromyalgia Mother    Hypercholesterolemia Mother    Leukemia Sister    Cancer Brother    Stroke Father    Heart attack Brother    Diabetes Son    Fibromyalgia Son    Hypertension Son       Prior to Admission medications   Medication Sig Start Date End Date Taking? Authorizing Provider  ascorbic acid  (VITAMIN C) 500 MG tablet Take 500 mg by mouth daily.    [provider]  calcium -vitamin D (OSCAL WITH D) 500-200 MG-UNIT tablet Take 1 tablet by mouth daily with breakfast.    [provider]  cetirizine (ZYRTEC) 10 MG tablet Take 10 mg by mouth in the morning.     [provider]  Cyanocobalamin  (B-12 PO) Take 1 tablet by mouth daily.    [provider]  furosemide  (LASIX ) 40 MG tablet TAKE 1 TABLET BY MOUTH ONCE A DAY. 01/26/24   Debera Jayson MATSU, MD  guaiFENesin -dextromethorphan  (ROBITUSSIN DM) 100-10 MG/5ML syrup Take 10 mLs by mouth every 4 (four) hours as needed for cough. 05/30/21   Johnson, Clanford L, MD  metoprolol  succinate (TOPROL -XL) 25 MG 24  hr tablet Take 25 mg by mouth daily.    [provider]  Multiple Vitamins-Minerals (VITAMIN D3 COMPLETE PO) Take 1 tablet by mouth daily.    [provider]  potassium chloride  (KLOR-CON ) 10 MEQ tablet TAKE 1 TABLET BY MOUTH ONCE A DAY. 01/26/24   Debera Jayson MATSU, MD  saccharomyces boulardii (FLORASTOR) 250 MG capsule Take 250 mg by mouth 2 (two) times daily.    [provider]  triamcinolone cream (KENALOG) 0.1 % Apply topically 2 (two) times daily. 02/26/22   [provider]  XARELTO  15 MG TABS tablet Take 15 mg by mouth in the morning.  12/14/19   [provider]  zinc  sulfate 220 (50 Zn) MG capsule Take 1 capsule (220 mg total) by mouth daily. 05/31/21   Vicci Afton CROME, MD    Physical Exam: BP (!) 147/72   Pulse 70   Temp 97.6 F (36.4 C) (Oral)   Resp 18   Ht 5' 2 (1.575 m)   Wt 59 kg   SpO2 93%   BMI 23.79 kg/m   General: 89 y.o. year-old female well developed well nourished in no acute distress.  Alert and oriented x3. HEENT: NCAT, EOMI Neck: Supple, trachea medial Cardiovascular: Regular rate and rhythm with no rubs or gallops.  No thyromegaly or JVD noted.  No lower extremity edema. 2/4 pulses in all 4 extremities. Respiratory: Clear to auscultation with no wheezes or rales. Good inspiratory effort. Abdomen: Soft, nontender nondistended with normal bowel sounds x4 quadrants. Muskuloskeletal: TTP of left knee.  Left knee in brace with restricted ROM.  Left pinky finger with soft induration and surrounding erythema, warm and tender to touch. Neuro: CN II-XII intact, sensation, reflexes intact Skin: No ulcerative lesions noted or rashes Psychiatry: Mood is appropriate for condition and setting          Labs on Admission:  Basic Metabolic Panel: Recent Labs  Lab 08/25/24 0115  NA 138  K 3.7  CL 97*  CO2 30  GLUCOSE 128*  BUN 16  CREATININE 1.03*  CALCIUM  9.7   Liver Function Tests: No results for input(s): AST, ALT, ALKPHOS, BILITOT, PROT, ALBUMIN in the last 168 hours. No results for input(s): LIPASE, AMYLASE in the last 168 hours. No results for input(s): AMMONIA in the last 168 hours. CBC: Recent Labs  Lab 08/25/24 0115  WBC 11.6*  NEUTROABS 9.9*  HGB 13.2  HCT 40.5  MCV 92.3  PLT 117*   Cardiac Enzymes: No results for input(s): CKTOTAL, CKMB, CKMBINDEX, TROPONINI in the last 168 hours.  BNP (last 3 results) No results for input(s): BNP in the last 8760 hours.  ProBNP (last 3 results) No results for input(s): PROBNP in the last 8760 hours.  CBG: No  results for input(s): GLUCAP in the last 168 hours.  Radiological Exams on Admission: CT Knee Left Wo Contrast Result Date: 08/26/2024 EXAM: CT LEFT KNEE, WITHOUT IV CONTRAST 08/26/2024 02:15:53 AM TECHNIQUE: Axial images were acquired through the left knee without IV contrast. Reformatted images were reviewed. Automated exposure control, iterative reconstruction, and/or weight based adjustment of the mA/kV was utilized to reduce the radiation dose to as low as reasonably achievable. COMPARISON: Left knee radiographs earlier today. CLINICAL HISTORY: Knee trauma, occult fracture suspected, xray done. Pt fell getting on toilet and hit left knee on trash can. 8/10 pain. FINDINGS: BONES: No fracture is seen. JOINTS: No dislocation. The joint spaces are normal. Mild degenerative changes. No suprapatellar joint effusion. SOFT TISSUES:  The soft tissues are unremarkable. IMPRESSION: 1. No acute osseous abnormality. Electronically signed by: Pinkie Pebbles MD 08/26/2024 02:25 AM EDT RP Workstation: [id number]   CT PELVIS WO CONTRAST Result Date: 08/26/2024 EXAM: CT Pelvis, Without IV Contrast 08/26/2024 02:15:53 AM TECHNIQUE: Axial images were acquired through the pelvis without IV contrast. Reformatted images were reviewed. Automated exposure control, iterative reconstruction, and/or weight based adjustment of the mA/kV was utilized to reduce the radiation dose to as low as reasonably achievable. COMPARISON: Right hip radiograph dated 08/26/2024. CLINICAL HISTORY: Hip trauma, fracture suspected, xray done. Pt fell getting on toilet and hit left knee on trash can. 8/10 pain. FINDINGS: BONES: No fracture is seen. The visualized bony pelvis appears intact. JOINTS: Bilateral hip joint spaces are preserved. No dislocation. SOFT TISSUES: Lobulated fluid density lesion in the right inguinal canal, chronic/benign. INTRAPELVIC CONTENTS: Known large exophytic right lower pole cyst incompletely visualized. Sigmoid  diverticulosis, without evidence of diverticulitis. IMPRESSION: 1. No acute osseous abnormality. Electronically signed by: Pinkie Pebbles MD 08/26/2024 02:24 AM EDT RP Workstation: [id number]   CT Head Wo Contrast Result Date: 08/26/2024 CLINICAL DATA:  Fall EXAM: CT HEAD WITHOUT CONTRAST TECHNIQUE: Contiguous axial images were obtained from the base of the skull through the vertex without intravenous contrast. RADIATION DOSE REDUCTION: This exam was performed according to the departmental dose-optimization program which includes automated exposure control, adjustment of the mA and/or kV according to patient size and/or use of iterative reconstruction technique. COMPARISON:  11/09/2023 FINDINGS: Brain: There is atrophy and chronic small vessel disease changes. There is atrophy and chronic small vessel disease changes. Old infarcts in the left frontal, parietal and occipital lobes. No acute intracranial abnormality. Specifically, no hemorrhage, hydrocephalus, mass lesion, acute infarction, or significant intracranial injury. Vascular: No hyperdense vessel or unexpected calcification. Skull: No acute calvarial abnormality. Sinuses/Orbits: No acute findings Other: None IMPRESSION: Old infarcts in the left cerebral hemisphere as above. Atrophy, chronic microvascular disease. No acute intracranial abnormality. Electronically Signed   By: Franky Crease M.D.   On: 08/26/2024 00:41   CT Cervical Spine Wo Contrast Result Date: 08/26/2024 CLINICAL DATA:  Fall. EXAM: CT CERVICAL SPINE WITHOUT CONTRAST TECHNIQUE: Multidetector CT imaging of the cervical spine was performed without intravenous contrast. Multiplanar CT image reconstructions were also generated. RADIATION DOSE REDUCTION: This exam was performed according to the departmental dose-optimization program which includes automated exposure control, adjustment of the mA and/or kV according to patient size and/or use of iterative reconstruction technique.  COMPARISON:  None Available. FINDINGS: Alignment: Normal Skull base and vertebrae: No acute fracture. No primary bone lesion or focal pathologic process. Soft tissues and spinal canal: No prevertebral fluid or swelling. No visible canal hematoma. Disc levels: Anterior spurring throughout the cervical spine. Mild bilateral degenerative facet disease. Upper chest: No acute findings Other: None IMPRESSION: No acute bony abnormality. Electronically Signed   By: Franky Crease M.D.   On: 08/26/2024 00:39   DG Chest Portable 1 View Result Date: 08/26/2024 CLINICAL DATA:  Fall EXAM: PORTABLE CHEST 1 VIEW COMPARISON:  11/09/2023 FINDINGS: Cardiomegaly, stable. Aortic atherosclerosis. Left pleural calcifications again noted, unchanged. No acute confluent airspace opacities. No effusions. IMPRESSION: Cardiomegaly. Chronic changes in the left lung with pleural calcifications. No acute cardiopulmonary disease. Electronically Signed   By: Franky Crease M.D.   On: 08/26/2024 00:38   DG Knee 2 Views Left Result Date: 08/26/2024 CLINICAL DATA:  Fall, left knee pain EXAM: LEFT KNEE - 1-2 VIEW COMPARISON:  None Available. FINDINGS: Early joint space narrowing.  No joint effusion. No acute bony abnormality. Specifically, no fracture, subluxation, or dislocation. IMPRESSION: Negative. Electronically Signed   By: Franky Crease M.D.   On: 08/26/2024 00:37   DG Hip Unilat W or Wo Pelvis 2-3 Views Left Result Date: 08/26/2024 CLINICAL DATA:  Fall, left hip pain EXAM: DG HIP (WITH OR WITHOUT PELVIS) 2-3V LEFT COMPARISON:  None Available. FINDINGS: There is no evidence of hip fracture or dislocation. There is no evidence of arthropathy or other focal bone abnormality. IMPRESSION: Negative. Electronically Signed   By: Franky Crease M.D.   On: 08/26/2024 00:37    EKG: I independently viewed the EKG done and my findings are as followed: EKG was not done in the ED  Assessment/Plan Present on Admission:  Atrial fibrillation, chronic  (HCC)  Essential hypertension  Principal Problem:   Fall at home, initial encounter Active Problems:   Essential hypertension   Atrial fibrillation, chronic (HCC)   Left knee pain   Cellulitis of left little finger  Fall at home, initial encounter Left knee pain Continue Percocet as needed Concern for progression Continue PT/OT eval and treat  Cellulitis of left little finger Patient was treated with doxycycline, we shall continue with Keflex   Chronic atrial fibrillation Continue metoprolol , Xarelto   Essential hypertension Continue metoprolol , Lasix  Continue Klor-Con     DVT prophylaxis: Xarelto   Code Status: DNR  Family Communication: Family at bedside (all questions answered to satisfaction)  Consults: None  Severity of Illness: The appropriate patient status for this patient is OBSERVATION. Observation status is judged to be reasonable and necessary in order to provide the required intensity of service to ensure the patient's safety. The patient's presenting symptoms, physical exam findings, and initial radiographic and laboratory data in the context of their medical condition is felt to place them at decreased risk for further clinical deterioration. Furthermore, it is anticipated that the patient will be medically stable for discharge from the hospital within 2 midnights of admission.   Author: Darryn Kydd, DO 08/26/2024 6:58 AM  For on call review [email or web address].

## 2024-08-26 NOTE — Evaluation (Deleted)
 Physical Therapy Evaluation Patient Details Name: Kathryn Marquez MRN: [id number] DOB: 1921/01/02 Today's Date: 08/26/2024  History of Present Illness  Kathryn Marquez is a 89 y.o. female with medical history significant of chronic atrial fibrillation on Xarelto , hypertension, thrombocytopenia and prior ischemic stroke who presents to the emergency department accompanied by daughter and son due to fall sustained at home.  Patient lives with daughter and she ambulates with walker fairly independently.  She went to the bathroom tonight and while trying to use the bathroom she lost balance and fell landing on a trash can next to the commode thereby injuring her left leg and now complaining of left knee to distal thigh pain which worsens on movement.  She denies hitting her head.   Clinical Impression  Pt. presented lethargic initially during treatment, and confused but progressed to being alert during session for bed mobility and transfers. Pt. Has LE pain and weakness during bed mobility and transfer from bed to chair, and needed min A to maintain postural control, and balance. The nurse was notified on the patients status. Patient will benefit from continued skilled physical therapy in hospital and recommended venue below to increase strength, balance, endurance for safe ADLs and gait.       If plan is discharge home, recommend the following: A lot of help with bathing/dressing/bathroom;Two people to help with walking and/or transfers;Two people to help with bathing/dressing/bathroom;A lot of help with walking and/or transfers   Can travel by private vehicle   Yes    Equipment Recommendations Rolling walker (2 wheels)  Recommendations for Other Services       Functional Status Assessment Patient has had a recent decline in their functional status and demonstrates the ability to make significant improvements in function in a reasonable and predictable amount of time.     Precautions /  Restrictions Precautions Precautions: Fall Recall of Precautions/Restrictions: Impaired Precaution/Restrictions Comments: limited with flexing R. knee Required Braces or Orthoses: Knee Immobilizer - Left Knee Immobilizer - Left: On at all times Restrictions Weight Bearing Restrictions Per Provider Order: No      Mobility  Bed Mobility Overal bed mobility: Needs Assistance Bed Mobility: Supine to Sit     Supine to sit: Max assist, HOB elevated     General bed mobility comments: Labored movement; increased pain in L LE.    Transfers Overall transfer level: Needs assistance Equipment used: Rolling walker (2 wheels) Transfers: Sit to/from Stand, Bed to chair/wheelchair/BSC Sit to Stand: Max assist Stand pivot transfers: Max assist         General transfer comment: Increased pain; difficulty mobilizing L LE; labored movement and much assist for EOB to chair.    Ambulation/Gait Ambulation/Gait assistance: Min assist, Mod assist Gait Distance (Feet): 4 Feet Assistive device: 1 person hand held assist, Rollator (4 wheels) Gait Pattern/deviations: Step-through pattern, Decreased step length - right, Decreased step length - left, Decreased stance time - right, Decreased stride length       General Gait Details: Pt. had limitation in the RLE during ambulation and transfer, and it diffcult for pt. to progress.  Stairs            Wheelchair Mobility     Tilt Bed    Modified Rankin (Stroke Patients Only)       Balance Overall balance assessment: Needs assistance Sitting-balance support: Bilateral upper extremity supported, Feet supported Sitting balance-Leahy Scale: Poor Sitting balance - Comments: poor to fair seated at EOB   Standing balance support:  Bilateral upper extremity supported, During functional activity, Reliant on assistive device for balance Standing balance-Leahy Scale: Poor Standing balance comment: using RW                              Pertinent Vitals/Pain Pain Assessment Pain Assessment: Faces Faces Pain Scale: Hurts whole lot Pain Location: L knee Pain Descriptors / Indicators: Guarding, Grimacing, Moaning, Aching Pain Intervention(s): Limited activity within patient's tolerance, Monitored during session, Repositioned    Home Living Family/patient expects to be discharged to:: Private residence Living Arrangements: Children Available Help at Discharge: Family;Available 24 hours/day Type of Home: House Home Access: Stairs to enter Entrance Stairs-Rails: None Entrance Stairs-Number of Steps: 2   Home Layout: One level Home Equipment: Rollator (4 wheels);Rolling Walker (2 wheels);BSC/3in1;Wheelchair - manual;Shower seat;Grab bars - toilet Additional Comments: pt. doesn't leave how much and used RW and rollator in the house    Prior Function Prior Level of Function : Needs assist;History of Falls (last six months);Patient poor historian/Family not available       Physical Assist : ADLs (physical)   ADLs (physical): IADLs;Toileting;Dressing;Bathing;Grooming Mobility Comments: [place] ambulator with RW; able to ambulate without assist in the house typically per family member's report. ADLs Comments: Family reports pt is assisted for ADL's at home.     Extremity/Trunk Assessment   Upper Extremity Assessment Upper Extremity Assessment: Defer to OT evaluation    Lower Extremity Assessment Lower Extremity Assessment: Generalized weakness;RLE deficits/detail RLE: Unable to fully assess due to pain RLE Sensation: WNL RLE Coordination: decreased gross motor    Cervical / Trunk Assessment Cervical / Trunk Assessment: Kyphotic  Communication   Communication Communication: No apparent difficulties Factors Affecting Communication: Hearing impaired    Cognition Arousal: Alert Behavior During Therapy: WFL for tasks assessed/performed   PT - Cognitive impairments: Memory                        PT - Cognition Comments: Pt. was initailly lethargic but progressed and was alert during treatment. Following commands: Intact       Cueing Cueing Techniques: Verbal cues, Tactile cues, Visual cues     General Comments      Exercises     Assessment/Plan    PT Assessment Patient needs continued PT services  PT Problem List Decreased strength;Decreased range of motion;Decreased activity tolerance;Decreased balance;Decreased mobility;Decreased coordination       PT Treatment Interventions      PT Goals (Current goals can be found in the Care Plan section)  Acute Rehab PT Goals Patient Stated Goal: pt. wants to go home to family/friends PT Goal Formulation: With family Time For Goal Achievement: 09/06/24 Potential to Achieve Goals: Good    Frequency Min 3X/week     Co-evaluation PT/OT/SLP Co-Evaluation/Treatment: Yes Reason for Co-Treatment: To address functional/ADL transfers PT goals addressed during session: Mobility/safety with mobility OT goals addressed during session: ADL's and self-care       AM-PAC PT 6 Clicks Mobility  Outcome Measure Help needed turning from your back to your side while in a flat bed without using bedrails?: A Little Help needed moving from lying on your back to sitting on the side of a flat bed without using bedrails?: A Lot Help needed moving to and from a bed to a chair (including a wheelchair)?: A Lot Help needed standing up from a chair using your arms (e.g., wheelchair or bedside chair)?: A Lot Help  needed to walk in hospital room?: A Lot Help needed climbing 3-5 steps with a railing? : A Lot 6 Click Score: 13    End of Session   Activity Tolerance: Patient limited by pain;Patient tolerated treatment well Patient left: in chair;with call bell/phone within reach Nurse Communication: Mobility status PT Visit Diagnosis: Repeated falls (R29.6);Muscle weakness (generalized) (M62.81);History of falling (Z91.81)    Time:  8983-8962 PT Time Calculation (min) (ACUTE ONLY): 21 min   Charges:   PT Evaluation $PT Eval Moderate Complexity: 1 Mod PT Treatments $Therapeutic Activity: 8-22 mins PT General Charges $$ ACUTE PT VISIT: 1 Visit         Elycia Woodside, SPT    Morad Tal 08/26/2024, 3:59 PM

## 2024-08-26 NOTE — ED Notes (Signed)
 Patient transported to CT

## 2024-08-26 NOTE — Evaluation (Deleted)
 Physical Therapy Evaluation Patient Details Name: Kathryn Marquez MRN: [id number] DOB: 12-May-1921 Today's Date: 08/26/2024  History of Present Illness  Kathryn Marquez is a 89 y.o. female with medical history significant of chronic atrial fibrillation on Xarelto , hypertension, thrombocytopenia and prior ischemic stroke who presents to the emergency department accompanied by daughter and son due to fall sustained at home.  Patient lives with daughter and she ambulates with walker fairly independently.  She went to the bathroom tonight and while trying to use the bathroom she lost balance and fell landing on a trash can next to the commode thereby injuring her left leg and now complaining of left knee to distal thigh pain which worsens on movement.  She denies hitting her head.  Clinical Impression  Pt. presented lethargic initially during treatment, and confused but progressed to being alert during session for bed mobility and transfers. Pt. Has LE pain and weakness during bed mobility and transfer from bed to chair, and needed max A to maintain postural control, and balance. The nurse was notified on the patients status. Patient will benefit from continued skilled physical therapy in hospital and recommended venue below to increase strength, balance, endurance for safe ADLs and gait.       If plan is discharge home, recommend the following: A lot of help with bathing/dressing/bathroom;Two people to help with walking and/or transfers;Two people to help with bathing/dressing/bathroom;A lot of help with walking and/or transfers   Can travel by private vehicle   Yes    Equipment Recommendations Rolling walker (2 wheels)  Recommendations for Other Services       Functional Status Assessment Patient has had a recent decline in their functional status and demonstrates the ability to make significant improvements in function in a reasonable and predictable amount of time.     Precautions /  Restrictions Precautions Precautions: Fall Recall of Precautions/Restrictions: Impaired Precaution/Restrictions Comments: limited with flexing R. knee Required Braces or Orthoses: Knee Immobilizer - Left Knee Immobilizer - Left: On at all times Restrictions Weight Bearing Restrictions Per Provider Order: No      Mobility  Bed Mobility Overal bed mobility: Needs Assistance Bed Mobility: Supine to Sit     Supine to sit: Max assist, HOB elevated     General bed mobility comments: Labored movement; increased pain in L LE.    Transfers Overall transfer level: Needs assistance Equipment used: Rolling walker (2 wheels) Transfers: Sit to/from Stand, Bed to chair/wheelchair/BSC Sit to Stand: Max assist Stand pivot transfers: Max assist         General transfer comment: Increased pain; difficulty mobilizing L LE; labored movement and much assist for EOB to chair.    Ambulation/Gait Ambulation/Gait assistance: Min assist, Mod assist Gait Distance (Feet): 4 Feet Assistive device: 1 person hand held assist, Rollator (4 wheels) Gait Pattern/deviations: Step-through pattern, Decreased step length - right, Decreased step length - left, Decreased stance time - right, Decreased stride length       General Gait Details: Pt. had limitation in the RLE during ambulation and transfer, and it diffcult for pt. to progress.  Stairs            Wheelchair Mobility     Tilt Bed    Modified Rankin (Stroke Patients Only)       Balance Overall balance assessment: Needs assistance Sitting-balance support: Bilateral upper extremity supported, Feet supported Sitting balance-Leahy Scale: Poor Sitting balance - Comments: poor to fair seated at EOB   Standing balance support: Bilateral  upper extremity supported, During functional activity, Reliant on assistive device for balance Standing balance-Leahy Scale: Poor Standing balance comment: using RW                              Pertinent Vitals/Pain Pain Assessment Pain Assessment: Faces Faces Pain Scale: Hurts whole lot Pain Location: L knee Pain Descriptors / Indicators: Guarding, Grimacing, Moaning, Aching Pain Intervention(s): Limited activity within patient's tolerance, Monitored during session, Repositioned    Home Living Family/patient expects to be discharged to:: Private residence Living Arrangements: Children Available Help at Discharge: Family;Available 24 hours/day Type of Home: House Home Access: Stairs to enter Entrance Stairs-Rails: None Entrance Stairs-Number of Steps: 2   Home Layout: One level Home Equipment: Rollator (4 wheels);Rolling Walker (2 wheels);BSC/3in1;Wheelchair - manual;Shower seat;Grab bars - toilet Additional Comments: pt. doesn't leave how much and used RW and rollator in the house    Prior Function Prior Level of Function : Needs assist;History of Falls (last six months);Patient poor historian/Family not available       Physical Assist : ADLs (physical)   ADLs (physical): IADLs;Toileting;Dressing;Bathing;Grooming Mobility Comments: Houshold ambulator with RW; able to ambulate without assist in the house typically per family member's report. ADLs Comments: Family reports pt is assisted for ADL's at home.     Extremity/Trunk Assessment   Upper Extremity Assessment Upper Extremity Assessment: Defer to OT evaluation    Lower Extremity Assessment Lower Extremity Assessment: Generalized weakness;RLE deficits/detail RLE: Unable to fully assess due to pain RLE Sensation: WNL RLE Coordination: decreased gross motor    Cervical / Trunk Assessment Cervical / Trunk Assessment: Kyphotic  Communication   Communication Communication: No apparent difficulties Factors Affecting Communication: Hearing impaired    Cognition Arousal: Alert Behavior During Therapy: WFL for tasks assessed/performed   PT - Cognitive impairments: Memory                        PT - Cognition Comments: Pt. was initailly lethargic but progressed and was alert during treatment. Following commands: Intact       Cueing Cueing Techniques: Verbal cues, Tactile cues, Visual cues     General Comments      Exercises     Assessment/Plan    PT Assessment Patient needs continued PT services  PT Problem List Decreased strength;Decreased range of motion;Decreased activity tolerance;Decreased balance;Decreased mobility;Decreased coordination       PT Treatment Interventions      PT Goals (Current goals can be found in the Care Plan section)  Acute Rehab PT Goals Patient Stated Goal: pt. wants to go home to family/friends PT Goal Formulation: With family Time For Goal Achievement: 09/06/24 Potential to Achieve Goals: Good    Frequency Min 3X/week     Co-evaluation PT/OT/SLP Co-Evaluation/Treatment: Yes Reason for Co-Treatment: To address functional/ADL transfers PT goals addressed during session: Mobility/safety with mobility OT goals addressed during session: ADL's and self-care       AM-PAC PT 6 Clicks Mobility  Outcome Measure Help needed turning from your back to your side while in a flat bed without using bedrails?: A Little Help needed moving from lying on your back to sitting on the side of a flat bed without using bedrails?: A Lot Help needed moving to and from a bed to a chair (including a wheelchair)?: A Lot Help needed standing up from a chair using your arms (e.g., wheelchair or bedside chair)?: A Lot Help needed  to walk in hospital room?: A Lot Help needed climbing 3-5 steps with a railing? : A Lot 6 Click Score: 13    End of Session   Activity Tolerance: Patient limited by pain;Patient tolerated treatment well Patient left: in chair;with call bell/phone within reach Nurse Communication: Mobility status PT Visit Diagnosis: Repeated falls (R29.6);Muscle weakness (generalized) (M62.81);History of falling (Z91.81)    Time:  -       Charges:                 Jamie Belger, SPT   Waldron Gerry 08/26/2024, 4:06 PM

## 2024-08-26 NOTE — Plan of Care (Signed)
  Problem: Acute Rehab OT Goals (only OT should resolve) Goal: Pt. Will Perform Eating Flowsheets (Taken 08/26/2024 1326) Pt Will Perform Eating:  with modified independence  sitting Goal: Pt. Will Perform Grooming Flowsheets (Taken 08/26/2024 1326) Pt Will Perform Grooming:  with supervision  sitting Goal: Pt. Will Perform Upper Body Dressing Flowsheets (Taken 08/26/2024 1326) Pt Will Perform Upper Body Dressing:  with contact guard assist  sitting Goal: Pt. Will Perform Lower Body Dressing Flowsheets (Taken 08/26/2024 1326) Pt Will Perform Lower Body Dressing:  with mod assist  sitting/lateral leans Goal: Pt. Will Transfer To Toilet Flowsheets (Taken 08/26/2024 1326) Pt Will Transfer to Toilet:  with min assist  stand pivot transfer Goal: Pt. Will Perform Toileting-Clothing Manipulation Flowsheets (Taken 08/26/2024 1326) Pt Will Perform Toileting - Clothing Manipulation and hygiene:  with mod assist  sitting/lateral leans  bed level Goal: Pt/Caregiver Will Perform Home Exercise Program Flowsheets (Taken 08/26/2024 1326) Pt/caregiver will Perform Home Exercise Program:  Increased ROM  Increased strength  Both right and left upper extremity  With minimal assist  Sharone Picchi OT, MOT

## 2024-08-26 NOTE — NC FL2 (Signed)
 [place]  MEDICAID FL2 LEVEL OF CARE FORM     IDENTIFICATION  Patient Name: Kathryn Marquez Birthdate: [birth year] Sex: female Admission Date (Current Location): 08/25/2024  [facility] and [place] Number:  [employer] and Address:  [facility],  [address], [place] [address]      Provider Number: [id number]  Attending Physician Name and Address:  Mcarthur Pick, MD  Relative Name and Phone Number:  Silas Sedam ( daughter ) [phone]    Current Level of Care: Hospital Recommended Level of Care: Skilled Nursing Facility Prior Approval Number:    Date Approved/Denied:   PASRR Number: [id number]  Discharge Plan: SNF    Current Diagnoses: Patient Active Problem List   Diagnosis Date Noted   Fall at home, initial encounter 08/26/2024   Left knee pain 08/26/2024   Cellulitis of left little finger 08/26/2024   Pressure injury of skin 05/30/2021   COVID-19 05/27/2021   COVID-19 virus infection 05/26/2021   Elevated brain natriuretic peptide (BNP) level 05/26/2021   Prolonged QT interval 05/26/2021   Thrombocytopenia 05/26/2021   Chronic respiratory failure with hypoxia (HCC) 05/26/2021   Recurrent Clostridioides difficile diarrhea 06/11/2020   Acute metabolic encephalopathy 06/10/2020   Dehydration 05/09/2020   Colitis 05/08/2020   C. 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 04/29/2012   Essential hypertension 04/29/2012   Atrial fibrillation, chronic (HCC) 04/29/2012   CLOSED COLLES FRACTURE 08/02/2010    Orientation RESPIRATION BLADDER Height & Weight     Self  Normal  Continent Weight: 130 lb 1.1 oz (59 kg) Height:  5' 2 (157.5 cm)  BEHAVIORAL SYMPTOMS/MOOD NEUROLOGICAL BOWEL NUTRITION STATUS      Continent Diet (Heart)  AMBULATORY STATUS COMMUNICATION OF NEEDS Skin   Extensive Assist Verbally Normal                       Personal Care Assistance Level of Assistance  Bathing, Feeding, Dressing Bathing Assistance: Maximum assistance Feeding assistance: Independent Dressing Assistance: Maximum assistance     Functional Limitations Info  Sight, Hearing, Speech Sight Info: Impaired Hearing Info: Adequate Speech Info: Adequate    SPECIAL CARE FACTORS FREQUENCY  PT (By licensed PT), OT (By licensed OT)     PT Frequency: 5 x a week OT Frequency: 5 x a week            Contractures Contractures Info: Not present    Additional Factors Info  Code Status, Allergies Code Status Info: DNR-Limited Allergies Info: NKA           Current Medications (08/26/2024):  This is the current hospital active medication list Current Facility-Administered Medications  Medication Dose Route Frequency Provider Last Rate Last Admin   acetaminophen  (TYLENOL ) tablet 650 mg  650 mg Oral Q6H PRN Adefeso, Oladapo, DO       Or   acetaminophen  (TYLENOL ) suppository 650 mg  650 mg Rectal Q6H PRN Adefeso, Oladapo, DO       cephALEXin  (KEFLEX ) capsule 500 mg  500 mg Oral Q6H Adefeso, Oladapo, DO   500 mg at 08/26/24 1135   ondansetron  (ZOFRAN ) tablet 4 mg  4 mg Oral Q6H PRN Adefeso, Oladapo, DO       Or   ondansetron  (ZOFRAN ) injection 4 mg  4 mg Intravenous Q6H PRN Adefeso, Oladapo, DO       oxyCODONE -acetaminophen  (PERCOCET/ROXICET) 5-325 MG per tablet 1 tablet  1 tablet Oral Q4H PRN Adefeso, Oladapo, DO   1 tablet at 08/26/24 1135   prochlorperazine (COMPAZINE) injection 10 mg  10 mg Intravenous Q6H PRN Adefeso, Oladapo, DO       Current Outpatient Medications  Medication Sig Dispense Refill   ascorbic acid  (VITAMIN C) 500 MG tablet Take 500 mg by mouth  daily.     calcium -vitamin D (OSCAL WITH D) 500-200 MG-UNIT tablet Take 1 tablet by mouth daily with breakfast.     cetirizine (ZYRTEC) 10 MG tablet Take 10 mg by mouth in the morning.      Cyanocobalamin  (B-12 PO) Take 1 tablet by mouth daily.     furosemide  (LASIX ) 40 MG tablet TAKE 1 TABLET BY MOUTH ONCE A DAY. 90 tablet 3   guaiFENesin -dextromethorphan  (ROBITUSSIN DM) 100-10 MG/5ML syrup Take 10 mLs by mouth every 4 (four) hours as needed for cough. 118 mL 0   metoprolol  succinate (TOPROL -XL) 25 MG 24 hr tablet Take 25 mg by mouth daily.     Multiple Vitamins-Minerals (VITAMIN D3 COMPLETE PO) Take 1 tablet by mouth daily.     potassium chloride  (KLOR-CON ) 10 MEQ tablet TAKE 1 TABLET BY MOUTH ONCE A DAY. 90 tablet 3   saccharomyces boulardii (FLORASTOR) 250 MG capsule Take 250 mg by mouth 2 (two) times daily.     triamcinolone cream (KENALOG) 0.1 % Apply topically 2 (two) times daily.     XARELTO  15 MG TABS tablet Take 15 mg by mouth in the morning.      zinc  sulfate 220 (50 Zn) MG capsule Take 1 capsule (220 mg total) by mouth daily. 30 capsule 0     Discharge Medications: Please see discharge summary for a list of discharge medications.  Relevant Imaging Results:  Relevant Lab Results:   Additional Information SNN: [id number]  Noreen KATHEE Pinal, LCSWA

## 2024-08-26 NOTE — Evaluation (Addendum)
 Physical Therapy Evaluation Patient Details Name: Kathryn Marquez MRN: [id number] DOB: 10/13/1921 Today's Date: 08/26/2024  History of Present Illness  Kathryn Marquez is a 89 y.o. female with medical history significant of chronic atrial fibrillation on Xarelto , hypertension, thrombocytopenia and prior ischemic stroke who presents to the emergency department accompanied by daughter and son due to fall sustained at home.  Patient lives with daughter and she ambulates with walker fairly independently.  She went to the bathroom tonight and while trying to use the bathroom she lost balance and fell landing on a trash can next to the commode thereby injuring her left leg and now complaining of left knee to distal thigh pain which worsens on movement.  She denies hitting her head.   Clinical Impression  Patient demonstrates slow labored movement for sitting up at bedside with difficulty moving LLE due to pain, very unsteady on feet and limited to a few slow labored side steps before having to sit due to pain and poor standing balance. Patient tolerated sitting up in chair after therapy - RN aware. Patient will benefit from continued skilled physical therapy in hospital and recommended venue below to increase strength, balance, endurance for safe ADLs and gait.           If plan is discharge home, recommend the following: A lot of help with bathing/dressing/bathroom;Two people to help with walking and/or transfers;Two people to help with bathing/dressing/bathroom;A lot of help with walking and/or transfers   Can travel by private vehicle   Yes    Equipment Recommendations None recommended by PT  Recommendations for Other Services       Functional Status Assessment Patient has had a recent decline in their functional status and demonstrates the ability to make significant improvements in function in a reasonable and predictable amount of time.     Precautions / Restrictions  Precautions Precautions: Fall Recall of Precautions/Restrictions: Impaired Required Braces or Orthoses: Knee Immobilizer - Left Knee Immobilizer - Left: On at all times Restrictions Weight Bearing Restrictions Per Provider Order: No      Mobility  Bed Mobility Overal bed mobility: Needs Assistance Bed Mobility: Supine to Sit     Supine to sit: Max assist, HOB elevated     General bed mobility comments: Labored movement; increased pain in L LE.    Transfers Overall transfer level: Needs assistance Equipment used: Rolling walker (2 wheels) Transfers: Sit to/from Stand, Bed to chair/wheelchair/BSC Sit to Stand: Max assist   Step pivot transfers: Max assist       General transfer comment: Increased pain; difficulty mobilizing L LE; labored movement and much assist for EOB to chair.    Ambulation/Gait Ambulation/Gait assistance: Min assist, Mod assist Gait Distance (Feet): 4 Feet Assistive device: Rolling walker (2 wheels) Gait Pattern/deviations: Step-through pattern, Decreased step length - right, Decreased step length - left, Decreased stance time - right, Decreased stride length, Antalgic, Shuffle Gait velocity: slow     General Gait Details: limitd to a few slow labored shuffling side steps  Stairs            Wheelchair Mobility     Tilt Bed    Modified Rankin (Stroke Patients Only)       Balance Overall balance assessment: Needs assistance Sitting-balance support: Feet supported, No upper extremity supported Sitting balance-Leahy Scale: Poor Sitting balance - Comments: fair/poor seated at EOB   Standing balance support: Bilateral upper extremity supported, During functional activity, Reliant on assistive device for balance Standing balance-Leahy  Scale: Poor Standing balance comment: using RW                             Pertinent Vitals/Pain Pain Assessment Faces Pain Scale: Hurts whole lot Pain Location: L knee Pain Descriptors  / Indicators: Guarding, Grimacing, Moaning, Aching Pain Intervention(s): Limited activity within patient's tolerance, Monitored during session, Repositioned    Home Living Family/patient expects to be discharged to:: Private residence Living Arrangements: Children Available Help at Discharge: Family;Available 24 hours/day Type of Home: House Home Access: Stairs to enter Entrance Stairs-Rails: None Entrance Stairs-Number of Steps: 2   Home Layout: One level Home Equipment: Rollator (4 wheels);Rolling Walker (2 wheels);BSC/3in1;Wheelchair - manual;Shower seat;Grab bars - toilet Additional Comments: pt. doesn't leave how much and used RW and rollator in the house    Prior Function Prior Level of Function : Needs assist;History of Falls (last six months);Patient poor historian/Family not available       Physical Assist : Mobility (physical);ADLs (physical) Mobility (physical): Bed mobility;Transfers;Gait ADLs (physical): IADLs;Toileting;Dressing;Bathing;Grooming Mobility Comments: Houshold ambulator with RW; able to ambulate without assist in the house typically per family member's report. ADLs Comments: Family reports pt is assisted for ADL's at home.     Extremity/Trunk Assessment   Upper Extremity Assessment Upper Extremity Assessment: Defer to OT evaluation    Lower Extremity Assessment Lower Extremity Assessment: Generalized weakness;LLE deficits/detail RLE: Unable to fully assess due to pain RLE Sensation: WNL RLE Coordination: decreased gross motor LLE Deficits / Details: grossly 3+/5 LLE: Unable to fully assess due to pain LLE Sensation: decreased light touch LLE Coordination: decreased fine motor    Cervical / Trunk Assessment Cervical / Trunk Assessment: Kyphotic  Communication   Communication Communication: No apparent difficulties Factors Affecting Communication: Hearing impaired    Cognition Arousal: Alert Behavior During Therapy: WFL for tasks  assessed/performed   PT - Cognitive impairments: Memory                         Following commands: Intact       Cueing Cueing Techniques: Verbal cues, Tactile cues, Visual cues     General Comments      Exercises     Assessment/Plan    PT Assessment Patient needs continued PT services  PT Problem List Decreased strength;Decreased activity tolerance;Decreased balance;Decreased mobility;Decreased coordination;Decreased range of motion       PT Treatment Interventions DME instruction;Gait training;Stair training;Functional mobility training;Therapeutic activities;Therapeutic exercise;Balance training;Patient/family education    PT Goals (Current goals can be found in the Care Plan section)  Acute Rehab PT Goals Patient Stated Goal: return home with family to assist PT Goal Formulation: With patient Time For Goal Achievement: 09/09/24 Potential to Achieve Goals: Good    Frequency Min 3X/week     Co-evaluation PT/OT/SLP Co-Evaluation/Treatment: Yes Reason for Co-Treatment: To address functional/ADL transfers PT goals addressed during session: Mobility/safety with mobility;Balance;Proper use of DME OT goals addressed during session: ADL's and self-care       AM-PAC PT 6 Clicks Mobility  Outcome Measure Help needed turning from your back to your side while in a flat bed without using bedrails?: A Lot Help needed moving from lying on your back to sitting on the side of a flat bed without using bedrails?: A Lot Help needed moving to and from a bed to a chair (including a wheelchair)?: A Lot Help needed standing up from a chair using your arms (e.g., wheelchair  or bedside chair)?: A Lot Help needed to walk in hospital room?: A Lot Help needed climbing 3-5 steps with a railing? : Total 6 Click Score: 11    End of Session   Activity Tolerance: Patient tolerated treatment well;Patient limited by fatigue;Patient limited by pain Patient left: in chair;with call  bell/phone within reach Nurse Communication: Mobility status PT Visit Diagnosis: Repeated falls (R29.6);Muscle weakness (generalized) (M62.81);History of falling (Z91.81);Unsteadiness on feet (R26.81)    Time: 8983-8962 PT Time Calculation (min) (ACUTE ONLY): 21 min   Charges:   PT Evaluation $PT Eval Moderate Complexity: 1 Mod PT Treatments $Therapeutic Activity: 8-22 mins PT General Charges $$ ACUTE PT VISIT: 1 Visit         4:53 PM, 08/26/24 Lynwood Music, MPT Physical Therapist with [facility] 336 [phone] office [phone] mobile phone

## 2024-08-26 NOTE — Plan of Care (Signed)
  Problem: Acute Rehab PT Goals(only PT should resolve) Goal: Pt Will Go Supine/Side To Sit Outcome: Progressing Flowsheets (Taken 08/26/2024 1609) Pt will go Supine/Side to Sit:  with moderate assist  with minimal assist   Problem: Acute Rehab PT Goals(only PT should resolve) Goal: Pt Will Go Sit To Supine/Side Outcome: Progressing Flowsheets (Taken 08/26/2024 1609) Pt will go Sit to Supine/Side: with moderate assist   Problem: Acute Rehab PT Goals(only PT should resolve) Goal: Patient Will Perform Sitting Balance Outcome: Progressing Flowsheets (Taken 08/26/2024 1609) Patient will perform sitting balance: with moderate assist   Problem: Acute Rehab PT Goals(only PT should resolve) Goal: Pt Will Perform Standing Balance Or Pre-Gait Outcome: Progressing Flowsheets (Taken 08/26/2024 1609) Pt will perform standing balance or pre-gait: with moderate assist   Problem: Acute Rehab PT Goals(only PT should resolve) Goal: Pt Will Ambulate Outcome: Progressing Flowsheets (Taken 08/26/2024 1609) Pt will Ambulate:  15 feet  with moderate assist  Lucus Lambertson, SPT

## 2024-08-26 NOTE — Evaluation (Signed)
 Occupational Therapy Evaluation Patient Details Name: Kathryn Marquez MRN: [id number] DOB: Sep 10, 1921 Today's Date: 08/26/2024   History of Present Illness   Kathryn Marquez is a 89 y.o. female with medical history significant of chronic atrial fibrillation on Xarelto , hypertension, thrombocytopenia and prior ischemic stroke who presents to the emergency department accompanied by daughter and son due to fall sustained at home.  Patient lives with daughter and she ambulates with walker fairly independently.  She went to the bathroom tonight and while trying to use the bathroom she lost balance and fell landing on a trash can next to the commode thereby injuring her left leg and now complaining of left knee to distal thigh pain which worsens on movement.  She denies hitting her head. (per DO)     Clinical Impressions Pt agreeable to OT and PT co-evaluation. Pt not at her cognitive baseline per family member reports. Family member present to provide living history. Pt has 24/7 assist at home and is assisted for ADL's at baseline. Pt typically can ambulate in the home with RW without assist. Today pt required max A for bed mobility and EOB to chair transfer with RW. Much pain noted in L LE (knee) area despite use of knee immobilizer. B UE weak with limited A/ROM at the shoulder. Max to total assist for lower body ADL's and mod to max for most upper body ADL's. Pt left in the chair with call bell within reach. Pt will benefit from continued OT in the hospital to increase strength, balance, and endurance for safe ADL's.        If plan is discharge home, recommend the following:   A lot of help with walking and/or transfers;A lot of help with bathing/dressing/bathroom;Assistance with cooking/housework;Assist for transportation;Help with stairs or ramp for entrance;Assistance with feeding;Direct supervision/assist for medications management     Functional Status Assessment   Patient has had a  recent decline in their functional status and demonstrates the ability to make significant improvements in function in a reasonable and predictable amount of time.     Equipment Recommendations   None recommended by OT             Precautions/Restrictions   Precautions Precautions: Fall Recall of Precautions/Restrictions: Impaired Required Braces or Orthoses: Knee Immobilizer - Left Restrictions Weight Bearing Restrictions Per Provider Order: No     Mobility Bed Mobility Overal bed mobility: Needs Assistance Bed Mobility: Supine to Sit     Supine to sit: Max assist, HOB elevated     General bed mobility comments: Labored movement; increased pain in L LE.    Transfers Overall transfer level: Needs assistance Equipment used: Rolling walker (2 wheels) Transfers: Sit to/from Stand, Bed to chair/wheelchair/BSC Sit to Stand: Max assist Stand pivot transfers: Max assist         General transfer comment: Increased pain; difficulty mobilizing L LE; labored movement and much assist for EOB to chair.      Balance Overall balance assessment: Needs assistance Sitting-balance support: Bilateral upper extremity supported, Feet supported Sitting balance-Leahy Scale: Poor Sitting balance - Comments: poor to fair seated at EOB   Standing balance support: Bilateral upper extremity supported, During functional activity, Reliant on assistive device for balance Standing balance-Leahy Scale: Poor Standing balance comment: using RW                           ADL either performed or assessed with clinical judgement   ADL  Overall ADL's : Needs assistance/impaired Eating/Feeding: Set up;Sitting   Grooming: Moderate assistance;Minimal assistance;Sitting   Upper Body Bathing: Moderate assistance;Maximal assistance;Sitting   Lower Body Bathing: Total assistance;Maximal assistance;Sitting/lateral leans   Upper Body Dressing : Moderate assistance;Maximal  assistance;Sitting   Lower Body Dressing: Maximal assistance;Total assistance;Sitting/lateral leans   Toilet Transfer: Maximal assistance;Stand-pivot;Rolling walker (2 wheels) Toilet Transfer Details (indicate cue type and reason): EOB to chair with RW Toileting- Clothing Manipulation and Hygiene: Maximal assistance;Sitting/lateral lean;Bed level       Functional mobility during ADLs: Maximal assistance;Rolling walker (2 wheels)       Vision Baseline Vision/History: 1 Wears glasses Ability to See in Adequate Light: 1 Impaired Patient Visual Report: No change from baseline Vision Assessment?: No apparent visual deficits     Perception Perception: Not tested       Praxis Praxis: Not tested       Pertinent Vitals/Pain Pain Assessment Pain Assessment: Faces Faces Pain Scale: Hurts whole lot Pain Location: L knee Pain Descriptors / Indicators: Guarding, Grimacing, Moaning Pain Intervention(s): Limited activity within patient's tolerance, Monitored during session, Repositioned     Extremity/Trunk Assessment Upper Extremity Assessment Upper Extremity Assessment: Generalized weakness (3-/5 bilateral shoulder flexion; full P/ROM for shoulder flexion with some pain reported near end range. geenraly weak; 3+/5 grip strength bilaterally.)   Lower Extremity Assessment Lower Extremity Assessment: Defer to PT evaluation   Cervical / Trunk Assessment Cervical / Trunk Assessment: Kyphotic   Communication Communication Communication: No apparent difficulties   Cognition Arousal: Alert Behavior During Therapy: WFL for tasks assessed/performed Cognition: Cognition impaired   Orientation impairments: Place, Time, Situation         OT - Cognition Comments: Not oreiented to anything but person. Pt did follow commands with tactile cuing. Family member present and provided history.                 Following commands: Intact       Cueing  General Comments   Cueing  Techniques: Verbal cues;Tactile cues;Visual cues                 Home Living Family/patient expects to be discharged to:: Private residence Living Arrangements: Children Available Help at Discharge: Family;Available 24 hours/day Type of Home: House Home Access: Stairs to enter [employer] of Steps: 2 Entrance Stairs-Rails: None Home Layout: One level     Bathroom Shower/Tub: Chief Strategy Officer: Standard Bathroom Accessibility: Yes How Accessible: Accessible via wheelchair;Accessible via walker Home Equipment: Rollator (4 wheels);Rolling Walker (2 wheels);BSC/3in1;Wheelchair - manual;Shower seat;Grab bars - toilet          Prior Functioning/Environment Prior Level of Function : Needs assist;History of Falls (last six months);Patient poor historian/Family not available       Physical Assist : ADLs (physical)   ADLs (physical): IADLs;Toileting;Dressing;Bathing;Grooming Mobility Comments: [place] ambulator with RW; able to ambulate without assist in the house typically per family member's report. ADLs Comments: Family reports pt is assisted for ADL's at home.    OT Problem List: Decreased strength;Decreased range of motion;Decreased activity tolerance;Impaired balance (sitting and/or standing);Decreased cognition;Decreased safety awareness;Pain;Impaired UE functional use   OT Treatment/Interventions: Self-care/ADL training;Therapeutic exercise;DME and/or AE instruction;Therapeutic activities;Patient/family education;Balance training;Cognitive remediation/compensation      OT Goals(Current goals can be found in the care plan section)   Acute Rehab OT Goals Patient Stated Goal: improve function OT Goal Formulation: With patient Time For Goal Achievement: 09/09/24 Potential to Achieve Goals: Good   OT Frequency:  Min 3X/week  Co-evaluation PT/OT/SLP Co-Evaluation/Treatment: Yes Reason for Co-Treatment: To address functional/ADL  transfers   OT goals addressed during session: ADL's and self-care      AM-PAC OT 6 Clicks Daily Activity     Outcome Measure Help from another person eating meals?: A Little Help from another person taking care of personal grooming?: A Lot Help from another person toileting, which includes using toliet, bedpan, or urinal?: A Lot Help from another person bathing (including washing, rinsing, drying)?: A Lot Help from another person to put on and taking off regular upper body clothing?: A Lot Help from another person to put on and taking off regular lower body clothing?: A Lot 6 Click Score: 13   End of Session Equipment Utilized During Treatment: Rolling walker (2 wheels)  Activity Tolerance: Patient tolerated treatment well;Patient limited by pain Patient left: in chair;with call bell/phone within reach  OT Visit Diagnosis: Unsteadiness on feet (R26.81);Other abnormalities of gait and mobility (R26.89);Muscle weakness (generalized) (M62.81);History of falling (Z91.81);Other symptoms and signs involving cognitive function;Pain Pain - Right/Left: Left Pain - part of body: Knee                Time: 1016-1030 OT Time Calculation (min): 14 min Charges:  OT General Charges $OT Visit: 1 Visit OT Evaluation $OT Eval Low Complexity: 1 Low  Cassandre Oleksy OT, MOT  Jayson Person 08/26/2024, 1:24 PM

## 2024-08-27 DIAGNOSIS — Y92009 Unspecified place in unspecified non-institutional (private) residence as the place of occurrence of the external cause: Secondary | ICD-10-CM | POA: Diagnosis not present

## 2024-08-27 DIAGNOSIS — I10 Essential (primary) hypertension: Secondary | ICD-10-CM | POA: Diagnosis not present

## 2024-08-27 DIAGNOSIS — W19XXXA Unspecified fall, initial encounter: Secondary | ICD-10-CM | POA: Diagnosis not present

## 2024-08-27 LAB — COMPREHENSIVE METABOLIC PANEL WITH GFR
ALT: 8 U/L (ref 0–44)
AST: 19 U/L (ref 15–41)
Albumin: 3.9 g/dL (ref 3.5–5.0)
Alkaline Phosphatase: 58 U/L (ref 38–126)
Anion gap: 12 (ref 5–15)
BUN: 15 mg/dL (ref 8–23)
CO2: 28 mmol/L (ref 22–32)
Calcium: 9.3 mg/dL (ref 8.9–10.3)
Chloride: 97 mmol/L — ABNORMAL LOW (ref 98–111)
Creatinine, Ser: 0.93 mg/dL (ref 0.44–1.00)
GFR, Estimated: 54 mL/min — ABNORMAL LOW (ref >60)
Glucose, Bld: 116 mg/dL — ABNORMAL HIGH (ref 70–99)
Potassium: 3.9 mmol/L (ref 3.5–5.1)
Sodium: 137 mmol/L (ref 135–145)
Total Bilirubin: 1.7 mg/dL — ABNORMAL HIGH (ref 0.0–1.2)
Total Protein: 6.2 g/dL — ABNORMAL LOW (ref 6.5–8.1)

## 2024-08-27 LAB — CBC
HCT: 40.8 % (ref 36.0–46.0)
Hemoglobin: 13.5 g/dL (ref 12.0–15.0)
MCH: 30.4 pg (ref 26.0–34.0)
MCHC: 33.1 g/dL (ref 30.0–36.0)
MCV: 91.9 fL (ref 80.0–100.0)
Platelets: 103 K/uL — ABNORMAL LOW (ref 150–400)
RBC: 4.44 MIL/uL (ref 3.87–5.11)
RDW: 13.9 % (ref 11.5–15.5)
WBC: 9.6 K/uL (ref 4.0–10.5)
nRBC: 0 % (ref 0.0–0.2)

## 2024-08-27 MED ORDER — POLYETHYLENE GLYCOL 3350 17 G PO PACK: 17.0000 g | PACK | Freq: Every day | ORAL | Status: DC | PRN

## 2024-08-27 MED ORDER — PHENOL 1.4 % MT LIQD
1.0000 | OROMUCOSAL | Status: DC | PRN
  Administered 2024-08-27: 1 via OROMUCOSAL
  Filled 2024-08-27: qty 177

## 2024-08-27 MED ORDER — ACETAMINOPHEN 325 MG PO TABS
650.0000 mg | ORAL_TABLET | Freq: Four times a day (QID) | ORAL | Status: DC
  Administered 2024-08-27 – 2024-08-28 (×5): 650 mg via ORAL
  Filled 2024-08-27 (×5): qty 2

## 2024-08-27 MED ORDER — NALOXONE HCL 0.4 MG/ML IJ SOLN: 0.4000 mg | INTRAMUSCULAR | Status: DC | PRN

## 2024-08-27 MED ORDER — OXYCODONE HCL 5 MG PO TABS
5.0000 mg | ORAL_TABLET | Freq: Four times a day (QID) | ORAL | Status: DC | PRN
  Administered 2024-08-27 – 2024-08-28 (×3): 5 mg via ORAL
  Filled 2024-08-27 (×3): qty 1

## 2024-08-27 MED ADMIN — Sennosides-Docusate Sodium Tab 8.6-50 MG: 1 | ORAL | NDC 57896030401

## 2024-08-27 MED FILL — Sennosides-Docusate Sodium Tab 8.6-50 MG: 1.0000 | ORAL | Qty: 1 | Status: AC

## 2024-08-27 NOTE — Care Management Obs Status (Signed)
 MEDICARE OBSERVATION STATUS NOTIFICATION   Patient Details  Name: Kathryn Marquez MRN: [id number] Date of Birth: 02-27-1921   Medicare Observation Status Notification Given:  Yes    Georgiann Neider L Karis Rilling 08/27/2024, 3:30 PM

## 2024-08-27 NOTE — Progress Notes (Signed)
 Physical Therapy Treatment Patient Details Name: Kathryn Marquez MRN: [id number] DOB: 1921-03-02 Today's Date: 08/27/2024   History of Present Illness Kathryn Marquez is a 89 y.o. female with medical history significant of chronic atrial fibrillation on Xarelto , hypertension, thrombocytopenia and prior ischemic stroke who presents to the emergency department accompanied by daughter and son due to fall sustained at home.  Patient lives with daughter and she ambulates with walker fairly independently.  She went to the bathroom tonight and while trying to use the bathroom she lost balance and fell landing on a trash can next to the commode thereby injuring her left leg and now complaining of left knee to distal thigh pain which worsens on movement.  She denies hitting her head.    PT Comments  Patient agreeable to PT treatment. Patient's daughter present during session and is very motivating towards her mother during session. Pt was received sitting in recliner. Agrees to seated LE exercise. Total assist to doff/donn L knee immobilizer during LE exercise. Pt very guarded and active motion against gravity limited throughout due to inc pain with movement to LLE. Print out of HEP give. One STS from recliner this date with max assist. Pt unable to take steps forward due to inc pain and dec weight shift to LLE. Pt tolerated sitting in recliner at end of session with family present, call button within reach and chair alarm set. Patient will benefit from continued skilled physical therapy acutely and recommended venue in order to address current deficits to improve overall function and QOL.    If plan is discharge home, recommend the following: A lot of help with bathing/dressing/bathroom;Two people to help with walking and/or transfers;Two people to help with bathing/dressing/bathroom;A lot of help with walking and/or transfers   Can travel by private vehicle        Equipment Recommendations  None  recommended by PT    Recommendations for Other Services       Precautions / Restrictions Precautions Precautions: Fall Recall of Precautions/Restrictions: Impaired Required Braces or Orthoses: Knee Immobilizer - Left Knee Immobilizer - Left: On at all times Restrictions Weight Bearing Restrictions Per Provider Order: No     Mobility  Bed Mobility               General bed mobility comments: pt received sitting in recliner at start of session    Transfers Overall transfer level: Needs assistance Equipment used: Rolling walker (2 wheels) Transfers: Sit to/from Stand Sit to Stand: Max assist           General transfer comment: 1 STS during session, max assist required, verbal cues for 1:1 hand placement and foot placement, limited most due to pain in L knee, dec weight bearing through LLE    Ambulation/Gait               General Gait Details: Attempted to make forward steps at bedside but unable due to pain   Stairs             Wheelchair Mobility     Tilt Bed    Modified Rankin (Stroke Patients Only)       Balance Overall balance assessment: Needs assistance Sitting-balance support: No upper extremity supported, Feet supported Sitting balance-Leahy Scale: Fair Sitting balance - Comments: seated in recliner   Standing balance support: Bilateral upper extremity supported, During functional activity, Reliant on assistive device for balance Standing balance-Leahy Scale: Poor Standing balance comment: using RW  Communication Communication Communication: No apparent difficulties Factors Affecting Communication: Hearing impaired  Cognition Arousal: Alert Behavior During Therapy: WFL for tasks assessed/performed                             Following commands: Intact      Cueing Cueing Techniques: Verbal cues, Tactile cues, Visual cues, Gestural cues  Exercises General Exercises - Lower  Extremity Long Arc Quad: AROM, Strengthening, 10 reps, Seated, Left Hip ABduction/ADduction: AROM, Strengthening, Both, 10 reps, Seated Hip Flexion/Marching: AROM, Strengthening, Left, 10 reps, Seated Toe Raises: AROM, Strengthening, Left, 10 reps, Seated    General Comments        Pertinent Vitals/Pain Pain Assessment Pain Assessment: Faces Faces Pain Scale: Hurts whole lot Pain Location: L knee Pain Descriptors / Indicators: Guarding, Grimacing, Moaning, Aching Pain Intervention(s): Limited activity within patient's tolerance, Repositioned, Monitored during session    Home Living                          Prior Function            PT Goals (current goals can now be found in the care plan section) Acute Rehab PT Goals Patient Stated Goal: return home with family to assist PT Goal Formulation: With patient Time For Goal Achievement: 09/09/24 Potential to Achieve Goals: Good Progress towards PT goals: Progressing toward goals    Frequency    Min 3X/week      PT Plan      Co-evaluation              AM-PAC PT 6 Clicks Mobility   Outcome Measure  Help needed turning from your back to your side while in a flat bed without using bedrails?: A Lot Help needed moving from lying on your back to sitting on the side of a flat bed without using bedrails?: A Lot Help needed moving to and from a bed to a chair (including a wheelchair)?: A Lot Help needed standing up from a chair using your arms (e.g., wheelchair or bedside chair)?: A Lot Help needed to walk in hospital room?: A Lot Help needed climbing 3-5 steps with a railing? : Total 6 Click Score: 11    End of Session Equipment Utilized During Treatment: Gait belt;Left knee immobilizer Activity Tolerance: Patient tolerated treatment well;Patient limited by fatigue;Patient limited by pain Patient left: in chair;with call bell/phone within reach;with family/visitor present;with chair alarm set Nurse  Communication: Mobility status PT Visit Diagnosis: Repeated falls (R29.6);Muscle weakness (generalized) (M62.81);History of falling (Z91.81);Unsteadiness on feet (R26.81)     Time: 1053-1130 PT Time Calculation (min) (ACUTE ONLY): 37 min  Charges:    $Therapeutic Exercise: 8-22 mins $Therapeutic Activity: 8-22 mins PT General Charges $$ ACUTE PT VISIT: 1 Visit                     2:24 PM, 08/27/24 Shahid Flori Powell-Butler, PT, DPT [place] with [facility]

## 2024-08-27 NOTE — Progress Notes (Signed)
 Occupational Therapy Treatment Patient Details Name: Kathryn Marquez MRN: [id number] DOB: June 05, 1921 Today's Date: 08/27/2024   History of present illness Kathryn Marquez is a 89 y.o. female with medical history significant of chronic atrial fibrillation on Xarelto , hypertension, thrombocytopenia and prior ischemic stroke who presents to the emergency department accompanied by daughter and son due to fall sustained at home.  Patient lives with daughter and she ambulates with walker fairly independently.  She went to the bathroom tonight and while trying to use the bathroom she lost balance and fell landing on a trash can next to the commode thereby injuring her left leg and now complaining of left knee to distal thigh pain which worsens on movement.  She denies hitting her head.   OT comments  Pt agreeable to OT treatment. Pt demonstrated slightly improved orientation today. Pt pleasant and followed commands well. Pt still showing signs of significant pain in L LE. Pt's daughter present near the end of session. Max A for bed mobility today and mod to max A for EOB to chair transfer. Pt struggles to bear weight on L LE and was only able to take a couple very short steps with RW prior to more max A to pivot to chair. Pt will benefit from continued OT in the hospital to increase strength, balance, and endurance for safe ADL's.         If plan is discharge home, recommend the following:  A lot of help with walking and/or transfers;A lot of help with bathing/dressing/bathroom;Assistance with cooking/housework;Assist for transportation;Help with stairs or ramp for entrance;Assistance with feeding;Direct supervision/assist for medications management   Equipment Recommendations  None recommended by OT          Precautions / Restrictions Precautions Precautions: Fall Recall of Precautions/Restrictions: Impaired Precaution/Restrictions Comments: limited with flexing R. knee Required Braces or  Orthoses: Knee Immobilizer - Left Restrictions Weight Bearing Restrictions Per Provider Order: No       Mobility Bed Mobility Overal bed mobility: Needs Assistance Bed Mobility: Supine to Sit     Supine to sit: Max assist, HOB elevated, Used rails     General bed mobility comments: labored movement; pat able to assist with scoot to EOB some    Transfers Overall transfer level: Needs assistance Equipment used: Rolling walker (2 wheels) Transfers: Sit to/from Stand, Bed to chair/wheelchair/BSC Sit to Stand: Max assist, Mod assist     Step pivot transfers: Max assist, Mod assist     General transfer comment: EOB to chair; pt able to take a couple small steps to chair but struggled to bear weight on L LE and required more assist to pivot fully to the chair safetly.     Balance Overall balance assessment: Needs assistance Sitting-balance support: No upper extremity supported, Feet supported Sitting balance-Leahy Scale: Fair Sitting balance - Comments: seated at EOB   Standing balance support: Bilateral upper extremity supported, During functional activity, Reliant on assistive device for balance Standing balance-Leahy Scale: Poor Standing balance comment: using RW                            Communication Communication Communication: No apparent difficulties   Cognition Arousal: Alert Behavior During Therapy: WFL for tasks assessed/performed Cognition: Cognition impaired   Orientation impairments: Time         OT - Cognition Comments: Improved orientation. Pt able to recall she had a fall.  Following commands: Intact        Cueing   Cueing Techniques: Verbal cues, Tactile cues, Visual cues  Exercises                   Pertinent Vitals/ Pain       Pain Assessment Pain Assessment: Faces Faces Pain Scale: Hurts whole lot Pain Location: L knee Pain Descriptors / Indicators: Guarding, Grimacing, Moaning, Aching Pain  Intervention(s): Monitored during session, Limited activity within patient's tolerance, Repositioned                                                          Frequency  Min 3X/week        Progress Toward Goals  OT Goals(current goals can now be found in the care plan section)  Progress towards OT goals: Progressing toward goals  Acute Rehab OT Goals Patient Stated Goal: Improve function OT Goal Formulation: With patient Time For Goal Achievement: 09/09/24 Potential to Achieve Goals: Good ADL Goals Pt Will Perform Eating: with modified independence;sitting Pt Will Perform Grooming: with supervision;sitting Pt Will Perform Upper Body Dressing: with contact guard assist;sitting Pt Will Perform Lower Body Dressing: with mod assist;sitting/lateral leans Pt Will Transfer to Toilet: with min assist;stand pivot transfer Pt Will Perform Toileting - Clothing Manipulation and hygiene: with mod assist;sitting/lateral leans;bed level Pt/caregiver will Perform Home Exercise Program: Increased ROM;Increased strength;Both right and left upper extremity;With minimal assist  Plan                                      End of Session Equipment Utilized During Treatment: Rolling walker (2 wheels);Gait belt  OT Visit Diagnosis: Unsteadiness on feet (R26.81);Other abnormalities of gait and mobility (R26.89);Muscle weakness (generalized) (M62.81);History of falling (Z91.81);Other symptoms and signs involving cognitive function;Pain Pain - Right/Left: Left Pain - part of body: Knee   Activity Tolerance Patient tolerated treatment well   Patient Left in chair;with call bell/phone within reach;with chair alarm set;with family/visitor present   Nurse Communication Mobility status (Informed NT that pt struggled.)        Time: 9044-8978 OT Time Calculation (min): 26 min  Charges: OT General Charges $OT Visit: 1 Visit OT Treatments $Therapeutic Activity:  23-37 mins  Graysyn Bache OT, MOT  Jayson Person 08/27/2024, 11:18 AM

## 2024-08-27 NOTE — Progress Notes (Signed)
 PROGRESS NOTE    Kathryn Marquez  FMW:[id number] DOB: 11-21-1920 DOA: 08/25/2024 PCP: Pcp, No   Brief Narrative:  Kathryn Marquez is a 89 y.o. female with medical history significant of chronic atrial fibrillation on Xarelto , hypertension, thrombocytopenia and prior ischemic stroke who presents to the emergency department accompanied by daughter and son due to fall sustained at home.  Patient lives with daughter and she ambulates with walker fairly independently.  She went to the bathroom tonight and while trying to use the bathroom she lost balance and fell landing on a trash can next to the commode thereby injuring her left leg and now complaining of left knee to distal thigh pain which worsens on movement.  She denies hitting her head.   Extensive evaluation in the ER included x-ray of hip, lower extremity negative for acute fracture or dislocation.  CT of the head, CT cervical spine also negative.  CT of the hip, knee was also done rules out occult fracture.  Patient admitted due to ongoing pain and ambulation deficits.  Assessment & Plan:   Principal Problem:   Fall at home, initial encounter Active Problems:   Essential hypertension   Atrial fibrillation, chronic (HCC)   Left knee pain   Cellulitis of left little finger   Fall at home, initial encounter Left knee pain -Extensive imaging negative for fracture/dislocation or other significant trauma. She has received several doses of Percocet overnight.  We will schedule Tylenol  650 mg every 6 hour and use oxycodone  as needed PT/OT evaluation--> patient will benefit from continued therapy.   Cellulitis of left little finger Patient was treated with doxycycline, we shall continue with Keflex   Concern for UTI: Granddaughter requested UA, sample sent.  UA positive for 6-10 WBCs, leukocytes moderate.  She is already on  Keflex .   Chronic atrial fibrillation Continue metoprolol , Xarelto    Essential hypertension Continue  metoprolol , Lasix  Continue Klor-Con     dispo: rehab?  Patient is medically stable for discharge.   DVT prophylaxis: Xarelto    Code Status: DNR   Family Communication:    Consults: None   Severity of Illness: The appropriate patient status for this patient is OBSERVATION. Observation status is judged to be reasonable and necessary in order to provide the required intensity of service to ensure the patient's safety. The patient's presenting symptoms, physical exam findings, and initial radiographic and laboratory data in the context of their medical condition is felt to place them at decreased risk for further clinical deterioration. Furthermore, it is anticipated that the patient will be medically stable for discharge from the hospital within 2 midnights of admission.       Subjective: Patient seen at the bedside.  She reported some pain in her left leg.  She was sitting on the recliner.  Vital signs are stable.  Daughter at the bedside.  She has used Percocet 4-5 times in the past 24 hours.  Discussed PT OT evaluation and possible need for rehab   Objective: Vitals:   08/26/24 1400 08/26/24 1500 08/26/24 1536 08/27/24 0055  BP: 119/63 114/67 118/64 123/62  Pulse: 71 71 72 81  Resp:   18 18  Temp:   97.6 F (36.4 C) 98.3 F (36.8 C)  TempSrc:   Oral   SpO2: 91% 91% 95% 94%  Weight:   57.9 kg   Height:   5' 2 (1.575 m)     Intake/Output Summary (Last 24 hours) at 08/27/2024 0912 Last data filed at 08/27/2024 0500 Gross per  24 hour  Intake 240 ml  Output 200 ml  Net 40 ml   Filed Weights   08/25/24 2245 08/26/24 1536  Weight: 59 kg 57.9 kg    Examination:  General: Elderly female not in any acute distress HEENT: Moist oral mucosa Chest: Clear to auscultation bilaterally, no wheezes CVS: S1, S2, murmur present Abdomen: Soft, nontender Extremities: Left lower extremity in knee brace.  Data Reviewed: I have personally reviewed following labs and imaging  studies  CBC: Recent Labs  Lab 08/25/24 0115 08/27/24 0424  WBC 11.6* 9.6  NEUTROABS 9.9*  --   HGB 13.2 13.5  HCT 40.5 40.8  MCV 92.3 91.9  PLT 117* 103*   Basic Metabolic Panel: Recent Labs  Lab 08/25/24 0115 08/26/24 0115 08/27/24 0424  NA 138  --  137  K 3.7  --  3.9  CL 97*  --  97*  CO2 30  --  28  GLUCOSE 128*  --  116*  BUN 16  --  15  CREATININE 1.03*  --  0.93  CALCIUM  9.7  --  9.3  MG  --  2.2  --   PHOS  --  3.1  --    GFR: Estimated Creatinine Clearance: 24.8 mL/min (by C-G formula based on SCr of 0.93 mg/dL). Liver Function Tests: Recent Labs  Lab 08/27/24 0424  AST 19  ALT 8  ALKPHOS 58  BILITOT 1.7*  PROT 6.2*  ALBUMIN 3.9   No results for input(s): LIPASE, AMYLASE in the last 168 hours. No results for input(s): AMMONIA in the last 168 hours. Coagulation Profile: No results for input(s): INR, PROTIME in the last 168 hours. Cardiac Enzymes: No results for input(s): CKTOTAL, CKMB, CKMBINDEX, TROPONINI in the last 168 hours. BNP (last 3 results) No results for input(s): PROBNP in the last 8760 hours. HbA1C: No results for input(s): HGBA1C in the last 72 hours. CBG: No results for input(s): GLUCAP in the last 168 hours. Lipid Profile: No results for input(s): CHOL, HDL, LDLCALC, TRIG, CHOLHDL, LDLDIRECT in the last 72 hours. Thyroid  Function Tests: No results for input(s): TSH, T4TOTAL, FREET4, T3FREE, THYROIDAB in the last 72 hours. Anemia Panel: No results for input(s): VITAMINB12, FOLATE, FERRITIN, TIBC, IRON, RETICCTPCT in the last 72 hours. Sepsis Labs: No results for input(s): PROCALCITON, LATICACIDVEN in the last 168 hours.  No results found for this or any previous visit (from the past 240 hours).       Radiology Studies: CT Knee Left Wo Contrast Result Date: 08/26/2024 EXAM: CT LEFT KNEE, WITHOUT IV CONTRAST 08/26/2024 02:15:53 AM TECHNIQUE: Axial images were  acquired through the left knee without IV contrast. Reformatted images were reviewed. Automated exposure control, iterative reconstruction, and/or weight based adjustment of the mA/kV was utilized to reduce the radiation dose to as low as reasonably achievable. COMPARISON: Left knee radiographs earlier today. CLINICAL HISTORY: Knee trauma, occult fracture suspected, xray done. Pt fell getting on toilet and hit left knee on trash can. 8/10 pain. FINDINGS: BONES: No fracture is seen. JOINTS: No dislocation. The joint spaces are normal. Mild degenerative changes. No suprapatellar joint effusion. SOFT TISSUES: The soft tissues are unremarkable. IMPRESSION: 1. No acute osseous abnormality. Electronically signed by: Pinkie Pebbles MD 08/26/2024 02:25 AM EDT RP Workstation: [id number]   CT PELVIS WO CONTRAST Result Date: 08/26/2024 EXAM: CT Pelvis, Without IV Contrast 08/26/2024 02:15:53 AM TECHNIQUE: Axial images were acquired through the pelvis without IV contrast. Reformatted images were reviewed. Automated exposure control, iterative reconstruction, and/or  weight based adjustment of the mA/kV was utilized to reduce the radiation dose to as low as reasonably achievable. COMPARISON: Right hip radiograph dated 08/26/2024. CLINICAL HISTORY: Hip trauma, fracture suspected, xray done. Pt fell getting on toilet and hit left knee on trash can. 8/10 pain. FINDINGS: BONES: No fracture is seen. The visualized bony pelvis appears intact. JOINTS: Bilateral hip joint spaces are preserved. No dislocation. SOFT TISSUES: Lobulated fluid density lesion in the right inguinal canal, chronic/benign. INTRAPELVIC CONTENTS: Known large exophytic right lower pole cyst incompletely visualized. Sigmoid diverticulosis, without evidence of diverticulitis. IMPRESSION: 1. No acute osseous abnormality. Electronically signed by: Pinkie Pebbles MD 08/26/2024 02:24 AM EDT RP Workstation: [id number]   CT Head Wo Contrast Result Date:  08/26/2024 CLINICAL DATA:  Fall EXAM: CT HEAD WITHOUT CONTRAST TECHNIQUE: Contiguous axial images were obtained from the base of the skull through the vertex without intravenous contrast. RADIATION DOSE REDUCTION: This exam was performed according to the departmental dose-optimization program which includes automated exposure control, adjustment of the mA and/or kV according to patient size and/or use of iterative reconstruction technique. COMPARISON:  11/09/2023 FINDINGS: Brain: There is atrophy and chronic small vessel disease changes. There is atrophy and chronic small vessel disease changes. Old infarcts in the left frontal, parietal and occipital lobes. No acute intracranial abnormality. Specifically, no hemorrhage, hydrocephalus, mass lesion, acute infarction, or significant intracranial injury. Vascular: No hyperdense vessel or unexpected calcification. Skull: No acute calvarial abnormality. Sinuses/Orbits: No acute findings Other: None IMPRESSION: Old infarcts in the left cerebral hemisphere as above. Atrophy, chronic microvascular disease. No acute intracranial abnormality. Electronically Signed   By: Franky Crease M.D.   On: 08/26/2024 00:41   CT Cervical Spine Wo Contrast Result Date: 08/26/2024 CLINICAL DATA:  Fall. EXAM: CT CERVICAL SPINE WITHOUT CONTRAST TECHNIQUE: Multidetector CT imaging of the cervical spine was performed without intravenous contrast. Multiplanar CT image reconstructions were also generated. RADIATION DOSE REDUCTION: This exam was performed according to the departmental dose-optimization program which includes automated exposure control, adjustment of the mA and/or kV according to patient size and/or use of iterative reconstruction technique. COMPARISON:  None Available. FINDINGS: Alignment: Normal Skull base and vertebrae: No acute fracture. No primary bone lesion or focal pathologic process. Soft tissues and spinal canal: No prevertebral fluid or swelling. No visible canal  hematoma. Disc levels: Anterior spurring throughout the cervical spine. Mild bilateral degenerative facet disease. Upper chest: No acute findings Other: None IMPRESSION: No acute bony abnormality. Electronically Signed   By: Franky Crease M.D.   On: 08/26/2024 00:39   DG Chest Portable 1 View Result Date: 08/26/2024 CLINICAL DATA:  Fall EXAM: PORTABLE CHEST 1 VIEW COMPARISON:  11/09/2023 FINDINGS: Cardiomegaly, stable. Aortic atherosclerosis. Left pleural calcifications again noted, unchanged. No acute confluent airspace opacities. No effusions. IMPRESSION: Cardiomegaly. Chronic changes in the left lung with pleural calcifications. No acute cardiopulmonary disease. Electronically Signed   By: Franky Crease M.D.   On: 08/26/2024 00:38   DG Knee 2 Views Left Result Date: 08/26/2024 CLINICAL DATA:  Fall, left knee pain EXAM: LEFT KNEE - 1-2 VIEW COMPARISON:  None Available. FINDINGS: Early joint space narrowing. No joint effusion. No acute bony abnormality. Specifically, no fracture, subluxation, or dislocation. IMPRESSION: Negative. Electronically Signed   By: Franky Crease M.D.   On: 08/26/2024 00:37   DG Hip Unilat W or Wo Pelvis 2-3 Views Left Result Date: 08/26/2024 CLINICAL DATA:  Fall, left hip pain EXAM: DG HIP (WITH OR WITHOUT PELVIS) 2-3V LEFT COMPARISON:  None  Available. FINDINGS: There is no evidence of hip fracture or dislocation. There is no evidence of arthropathy or other focal bone abnormality. IMPRESSION: Negative. Electronically Signed   By: Franky Crease M.D.   On: 08/26/2024 00:37        Scheduled Meds:  cephALEXin   500 mg Oral Q6H   furosemide   40 mg Oral Daily   Influenza vac split trivalent PF  0.5 mL Intramuscular Tomorrow-1000   metoprolol  succinate  25 mg Oral Daily   potassium chloride   10 mEq Oral Daily   Rivaroxaban   15 mg Oral q AM   Continuous Infusions:        Monee Dembeck, MD [facility] 08/27/2024, 9:12 AM

## 2024-08-28 DIAGNOSIS — E559 Vitamin D deficiency, unspecified: Secondary | ICD-10-CM | POA: Diagnosis not present

## 2024-08-28 DIAGNOSIS — I639 Cerebral infarction, unspecified: Secondary | ICD-10-CM | POA: Diagnosis not present

## 2024-08-28 DIAGNOSIS — J3089 Other allergic rhinitis: Secondary | ICD-10-CM | POA: Diagnosis not present

## 2024-08-28 DIAGNOSIS — I10 Essential (primary) hypertension: Secondary | ICD-10-CM | POA: Diagnosis not present

## 2024-08-28 DIAGNOSIS — R059 Cough, unspecified: Secondary | ICD-10-CM | POA: Diagnosis not present

## 2024-08-28 DIAGNOSIS — I700 Atherosclerosis of aorta: Secondary | ICD-10-CM | POA: Diagnosis not present

## 2024-08-28 DIAGNOSIS — W19XXXA Unspecified fall, initial encounter: Secondary | ICD-10-CM | POA: Diagnosis not present

## 2024-08-28 DIAGNOSIS — I480 Paroxysmal atrial fibrillation: Secondary | ICD-10-CM | POA: Diagnosis not present

## 2024-08-28 DIAGNOSIS — D696 Thrombocytopenia, unspecified: Secondary | ICD-10-CM | POA: Diagnosis not present

## 2024-08-28 DIAGNOSIS — S8392XA Sprain of unspecified site of left knee, initial encounter: Secondary | ICD-10-CM | POA: Diagnosis not present

## 2024-08-28 DIAGNOSIS — J9611 Chronic respiratory failure with hypoxia: Secondary | ICD-10-CM | POA: Diagnosis not present

## 2024-08-28 DIAGNOSIS — I482 Chronic atrial fibrillation, unspecified: Secondary | ICD-10-CM | POA: Diagnosis not present

## 2024-08-28 DIAGNOSIS — M6281 Muscle weakness (generalized): Secondary | ICD-10-CM | POA: Diagnosis not present

## 2024-08-28 DIAGNOSIS — Z9181 History of falling: Secondary | ICD-10-CM | POA: Diagnosis not present

## 2024-08-28 DIAGNOSIS — Z7901 Long term (current) use of anticoagulants: Secondary | ICD-10-CM | POA: Diagnosis not present

## 2024-08-28 DIAGNOSIS — M25562 Pain in left knee: Secondary | ICD-10-CM | POA: Diagnosis not present

## 2024-08-28 DIAGNOSIS — Y92009 Unspecified place in unspecified non-institutional (private) residence as the place of occurrence of the external cause: Secondary | ICD-10-CM | POA: Diagnosis not present

## 2024-08-28 DIAGNOSIS — L03012 Cellulitis of left finger: Secondary | ICD-10-CM | POA: Diagnosis not present

## 2024-08-28 DIAGNOSIS — R262 Difficulty in walking, not elsewhere classified: Secondary | ICD-10-CM | POA: Diagnosis not present

## 2024-08-28 DIAGNOSIS — F01B0 Vascular dementia, moderate, without behavioral disturbance, psychotic disturbance, mood disturbance, and anxiety: Secondary | ICD-10-CM | POA: Diagnosis not present

## 2024-08-28 DIAGNOSIS — E876 Hypokalemia: Secondary | ICD-10-CM | POA: Diagnosis not present

## 2024-08-28 DIAGNOSIS — R41841 Cognitive communication deficit: Secondary | ICD-10-CM | POA: Diagnosis not present

## 2024-08-28 DIAGNOSIS — W19XXXD Unspecified fall, subsequent encounter: Secondary | ICD-10-CM | POA: Diagnosis not present

## 2024-08-28 DIAGNOSIS — R600 Localized edema: Secondary | ICD-10-CM | POA: Diagnosis not present

## 2024-08-28 DIAGNOSIS — D519 Vitamin B12 deficiency anemia, unspecified: Secondary | ICD-10-CM | POA: Diagnosis not present

## 2024-08-28 MED ORDER — SENNOSIDES-DOCUSATE SODIUM 8.6-50 MG PO TABS: 1.0000 | ORAL_TABLET | Freq: Every evening | ORAL | 0 refills | Status: AC | PRN

## 2024-08-28 MED ORDER — CEPHALEXIN 500 MG PO CAPS: 500.0000 mg | ORAL_CAPSULE | Freq: Four times a day (QID) | ORAL | 0 refills | Status: DC

## 2024-08-28 MED ORDER — OXYCODONE-ACETAMINOPHEN 5-325 MG PO TABS: 1.0000 | ORAL_TABLET | ORAL | 0 refills | Status: DC | PRN

## 2024-08-28 NOTE — TOC Transition Note (Signed)
 Transition of Care [facility]) - Discharge Note   Patient Details  Name: Kathryn Marquez MRN: [id number] Date of Birth: February 13, 1921  [facility] CM/SW Contact:  Lucie Lunger, LCSWA Phone Number: 08/28/2024, 12:03 PM  Clinical Narrative:    CSW updated that insurance shara has been approved for SNF at [facility]. CSW updated by Kirke in admissions yesterday that if auth came through over weekend pt could D/C. CSW provided RN with room and report numbers. CSW spoke with pt and family to provide update on D/C. TOC signing off.   Final next level of care: Skilled Nursing Facility Barriers to Discharge: Barriers Resolved   Patient Goals and CMS Choice Patient states their goals for this hospitalization and ongoing recovery are:: get stronger CMS Medicare.gov Compare Post Acute Care list provided to:: Patient Represenative (must comment) Choice offered to / list presented to : Adult Children [place] ownership interest in [facility].provided to:: Adult Children    Discharge Placement              Patient chooses bed at: [facility] Patient to be transferred to facility by: facility staff Name of family member notified: Daughter [name] Patient and family notified of of transfer: 08/28/24  Discharge Plan and Services Additional resources added to the After Visit Summary for   In-house Referral: Clinical Social Work   Post Acute Care Choice: Durable Medical Equipment                               Social Drivers of Health (SDOH) Interventions SDOH Screenings   Food Insecurity: No Food Insecurity (08/26/2024)  Housing: Low Risk  (08/26/2024)  Transportation Needs: No Transportation Needs (08/26/2024)  Utilities: Not At Risk (08/26/2024)  Tobacco Use: Low Risk  (08/25/2024)     Readmission Risk Interventions     No data to display

## 2024-08-28 NOTE — Discharge Summary (Signed)
 Physician Discharge Summary  Kathryn Marquez FMW:[id number] DOB: 12/10/1920 DOA: 08/25/2024  PCP: Freddrick, No  Admit date: 08/25/2024 Discharge date: 08/28/2024  Admitted From: Home Disposition: SNF  Recommendations for Outpatient Follow-up:  Follow up with PCP in 1 week  Follow up in ED if symptoms worsen or new appear   Discharge Condition: Stable CODE STATUS: DNR/DNI Diet recommendation: Heart healthy  Brief/Interim Summary:  Kathryn Marquez is a 89 y.o. female with medical history significant of chronic atrial fibrillation on Xarelto , hypertension, thrombocytopenia and prior ischemic stroke who presents to the emergency department accompanied by daughter and son due to fall sustained at home.  Patient lives with daughter and she ambulates with walker fairly independently.  She went to the bathroom tonight and while trying to use the bathroom she lost balance and fell landing on a trash can next to the commode thereby injuring her left leg and now complaining of left knee to distal thigh pain which worsens on movement.  She denies hitting her head.    Extensive evaluation in the ER included x-ray of hip, lower extremity negative for acute fracture or dislocation.  CT of the head, CT cervical spine also negative.  CT of the hip, knee was also done rules out occult fracture.  Patient admitted due to ongoing pain and ambulation deficits.   Assessment & Plan:    Fall at home, initial encounter Left knee pain -Extensive imaging negative for fracture/dislocation or other significant trauma. Continue Tylenol , Percocet will be prescribed at discharge. Weight-bear as tolerated. Discharge to SNF.  cellulitis of left little finger Improving, will complete a course of Keflex , 500 mg every 6 hours for 5 more days after discharge.  Concern for UTI: UA concerning for UTI, culture positive for Klebsiella.  Will complete antibiotic course as above.   Chronic atrial fibrillation Continue  metoprolol , Xarelto    Essential hypertension Continue metoprolol , Lasix  Continue Klor-Con     dispo: SNF   Patient is medically stable for discharge.  Discharge Diagnoses:  Principal Problem:   Fall at home, initial encounter Active Problems:   Essential hypertension   Atrial fibrillation, chronic (HCC)   Left knee pain   Cellulitis of left little finger    Discharge Instructions  Discharge Instructions     Call MD for:  persistant dizziness or light-headedness   Complete by: As directed    Call MD for:  severe uncontrolled pain   Complete by: As directed    Diet general   Complete by: As directed    Discharge instructions   Complete by: As directed    1.  Complete a course of Keflex  for UTI/cellulitis in finger.   Increase activity slowly   Complete by: As directed       Allergies as of 08/28/2024   No Known Allergies      Medication List     TAKE these medications    ascorbic acid  500 MG tablet Commonly known as: VITAMIN C Take 500 mg by mouth daily.   B-12 PO Take 1 tablet by mouth daily.   calcium -vitamin D 500-200 MG-UNIT tablet Commonly known as: OSCAL WITH D Take 1 tablet by mouth daily with breakfast.   cephALEXin  500 MG capsule Commonly known as: KEFLEX  Take 1 capsule (500 mg total) by mouth every 6 (six) hours.   cetirizine 10 MG tablet Commonly known as: ZYRTEC Take 10 mg by mouth in the morning.   furosemide  40 MG tablet Commonly known as: LASIX  TAKE 1 TABLET BY  MOUTH ONCE A DAY.   guaiFENesin -dextromethorphan  100-10 MG/5ML syrup Commonly known as: ROBITUSSIN DM Take 10 mLs by mouth every 4 (four) hours as needed for cough.   metoprolol  succinate 25 MG 24 hr tablet Commonly known as: TOPROL -XL Take 25 mg by mouth daily.   oxyCODONE -acetaminophen  5-325 MG tablet Commonly known as: Percocet Take 1 tablet by mouth every 4 (four) hours as needed for severe pain (pain score 7-10).   potassium chloride  10 MEQ tablet Commonly  known as: KLOR-CON  TAKE 1 TABLET BY MOUTH ONCE A DAY.   saccharomyces boulardii 250 MG capsule Commonly known as: FLORASTOR Take 250 mg by mouth 2 (two) times daily.   senna-docusate 8.6-50 MG tablet Commonly known as: Senokot-S Take 1 tablet by mouth at bedtime as needed for mild constipation.   triamcinolone cream 0.1 % Commonly known as: KENALOG Apply topically 2 (two) times daily.   VITAMIN D3 COMPLETE PO Take 1 tablet by mouth daily.   Xarelto  15 MG Tabs tablet Generic drug: Rivaroxaban  Take 15 mg by mouth in the morning.   zinc  sulfate (50mg  elemental zinc ) 220 (50 Zn) MG capsule Take 1 capsule (220 mg total) by mouth daily.        Follow-up Information     Bertell Satterfield, MD. Schedule an appointment as soon as possible for a visit in 1 week(s).   Specialty: Internal Medicine Contact information: [address] [place] [address] [phone]                No Known Allergies  Consultations:    Procedures/Studies: CT Knee Left Wo Contrast Result Date: 08/26/2024 EXAM: CT LEFT KNEE, WITHOUT IV CONTRAST 08/26/2024 02:15:53 AM TECHNIQUE: Axial images were acquired through the left knee without IV contrast. Reformatted images were reviewed. Automated exposure control, iterative reconstruction, and/or weight based adjustment of the mA/kV was utilized to reduce the radiation dose to as low as reasonably achievable. COMPARISON: Left knee radiographs earlier today. CLINICAL HISTORY: Knee trauma, occult fracture suspected, xray done. Pt fell getting on toilet and hit left knee on trash can. 8/10 pain. FINDINGS: BONES: No fracture is seen. JOINTS: No dislocation. The joint spaces are normal. Mild degenerative changes. No suprapatellar joint effusion. SOFT TISSUES: The soft tissues are unremarkable. IMPRESSION: 1. No acute osseous abnormality. Electronically signed by: Pinkie Pebbles MD 08/26/2024 02:25 AM EDT RP Workstation: [id number]   CT  PELVIS WO CONTRAST Result Date: 08/26/2024 EXAM: CT Pelvis, Without IV Contrast 08/26/2024 02:15:53 AM TECHNIQUE: Axial images were acquired through the pelvis without IV contrast. Reformatted images were reviewed. Automated exposure control, iterative reconstruction, and/or weight based adjustment of the mA/kV was utilized to reduce the radiation dose to as low as reasonably achievable. COMPARISON: Right hip radiograph dated 08/26/2024. CLINICAL HISTORY: Hip trauma, fracture suspected, xray done. Pt fell getting on toilet and hit left knee on trash can. 8/10 pain. FINDINGS: BONES: No fracture is seen. The visualized bony pelvis appears intact. JOINTS: Bilateral hip joint spaces are preserved. No dislocation. SOFT TISSUES: Lobulated fluid density lesion in the right inguinal canal, chronic/benign. INTRAPELVIC CONTENTS: Known large exophytic right lower pole cyst incompletely visualized. Sigmoid diverticulosis, without evidence of diverticulitis. IMPRESSION: 1. No acute osseous abnormality. Electronically signed by: Pinkie Pebbles MD 08/26/2024 02:24 AM EDT RP Workstation: [id number]   CT Head Wo Contrast Result Date: 08/26/2024 CLINICAL DATA:  Fall EXAM: CT HEAD WITHOUT CONTRAST TECHNIQUE: Contiguous axial images were obtained from the base of the skull through the vertex without intravenous contrast. RADIATION  DOSE REDUCTION: This exam was performed according to the departmental dose-optimization program which includes automated exposure control, adjustment of the mA and/or kV according to patient size and/or use of iterative reconstruction technique. COMPARISON:  11/09/2023 FINDINGS: Brain: There is atrophy and chronic small vessel disease changes. There is atrophy and chronic small vessel disease changes. Old infarcts in the left frontal, parietal and occipital lobes. No acute intracranial abnormality. Specifically, no hemorrhage, hydrocephalus, mass lesion, acute infarction, or significant intracranial  injury. Vascular: No hyperdense vessel or unexpected calcification. Skull: No acute calvarial abnormality. Sinuses/Orbits: No acute findings Other: None IMPRESSION: Old infarcts in the left cerebral hemisphere as above. Atrophy, chronic microvascular disease. No acute intracranial abnormality. Electronically Signed   By: Franky Crease M.D.   On: 08/26/2024 00:41   CT Cervical Spine Wo Contrast Result Date: 08/26/2024 CLINICAL DATA:  Fall. EXAM: CT CERVICAL SPINE WITHOUT CONTRAST TECHNIQUE: Multidetector CT imaging of the cervical spine was performed without intravenous contrast. Multiplanar CT image reconstructions were also generated. RADIATION DOSE REDUCTION: This exam was performed according to the departmental dose-optimization program which includes automated exposure control, adjustment of the mA and/or kV according to patient size and/or use of iterative reconstruction technique. COMPARISON:  None Available. FINDINGS: Alignment: Normal Skull base and vertebrae: No acute fracture. No primary bone lesion or focal pathologic process. Soft tissues and spinal canal: No prevertebral fluid or swelling. No visible canal hematoma. Disc levels: Anterior spurring throughout the cervical spine. Mild bilateral degenerative facet disease. Upper chest: No acute findings Other: None IMPRESSION: No acute bony abnormality. Electronically Signed   By: Franky Crease M.D.   On: 08/26/2024 00:39   DG Chest Portable 1 View Result Date: 08/26/2024 CLINICAL DATA:  Fall EXAM: PORTABLE CHEST 1 VIEW COMPARISON:  11/09/2023 FINDINGS: Cardiomegaly, stable. Aortic atherosclerosis. Left pleural calcifications again noted, unchanged. No acute confluent airspace opacities. No effusions. IMPRESSION: Cardiomegaly. Chronic changes in the left lung with pleural calcifications. No acute cardiopulmonary disease. Electronically Signed   By: Franky Crease M.D.   On: 08/26/2024 00:38   DG Knee 2 Views Left Result Date: 08/26/2024 CLINICAL  DATA:  Fall, left knee pain EXAM: LEFT KNEE - 1-2 VIEW COMPARISON:  None Available. FINDINGS: Early joint space narrowing. No joint effusion. No acute bony abnormality. Specifically, no fracture, subluxation, or dislocation. IMPRESSION: Negative. Electronically Signed   By: Franky Crease M.D.   On: 08/26/2024 00:37   DG Hip Unilat W or Wo Pelvis 2-3 Views Left Result Date: 08/26/2024 CLINICAL DATA:  Fall, left hip pain EXAM: DG HIP (WITH OR WITHOUT PELVIS) 2-3V LEFT COMPARISON:  None Available. FINDINGS: There is no evidence of hip fracture or dislocation. There is no evidence of arthropathy or other focal bone abnormality. IMPRESSION: Negative. Electronically Signed   By: Franky Crease M.D.   On: 08/26/2024 00:37      Subjective:   Discharge Exam: Vitals:   08/28/24 0433 08/28/24 0942  BP: 117/68 114/75  Pulse: 87 93  Resp: 14   Temp: 97.8 F (36.6 C)   SpO2: 91%     General: Pt is alert, awake, not in acute distress Cardiovascular: rate controlled, S1/S2 + Respiratory: bilateral decreased breath sounds at bases Abdominal: Soft, NT, ND, bowel sounds + Extremities: no edema, no cyanosis    The results of significant diagnostics from this hospitalization (including imaging, microbiology, ancillary and laboratory) are listed below for reference.     Microbiology: No results found for this or any previous visit (from the past  240 hours).   Labs: BNP (last 3 results) No results for input(s): BNP in the last 8760 hours. Basic Metabolic Panel: Recent Labs  Lab 08/25/24 0115 08/26/24 0115 08/27/24 0424  NA 138  --  137  K 3.7  --  3.9  CL 97*  --  97*  CO2 30  --  28  GLUCOSE 128*  --  116*  BUN 16  --  15  CREATININE 1.03*  --  0.93  CALCIUM  9.7  --  9.3  MG  --  2.2  --   PHOS  --  3.1  --    Liver Function Tests: Recent Labs  Lab 08/27/24 0424  AST 19  ALT 8  ALKPHOS 58  BILITOT 1.7*  PROT 6.2*  ALBUMIN 3.9   No results for input(s): LIPASE, AMYLASE  in the last 168 hours. No results for input(s): AMMONIA in the last 168 hours. CBC: Recent Labs  Lab 08/25/24 0115 08/27/24 0424  WBC 11.6* 9.6  NEUTROABS 9.9*  --   HGB 13.2 13.5  HCT 40.5 40.8  MCV 92.3 91.9  PLT 117* 103*   Cardiac Enzymes: No results for input(s): CKTOTAL, CKMB, CKMBINDEX, TROPONINI in the last 168 hours. BNP: Invalid input(s): POCBNP CBG: No results for input(s): GLUCAP in the last 168 hours. D-Dimer No results for input(s): DDIMER in the last 72 hours. Hgb A1c No results for input(s): HGBA1C in the last 72 hours. Lipid Profile No results for input(s): CHOL, HDL, LDLCALC, TRIG, CHOLHDL, LDLDIRECT in the last 72 hours. Thyroid  function studies No results for input(s): TSH, T4TOTAL, T3FREE, THYROIDAB in the last 72 hours.  Invalid input(s): FREET3 Anemia work up No results for input(s): VITAMINB12, FOLATE, FERRITIN, TIBC, IRON, RETICCTPCT in the last 72 hours. Urinalysis    Component Value Date/Time   COLORURINE YELLOW 08/26/2024 1110   APPEARANCEUR CLEAR 08/26/2024 1110   LABSPEC 1.014 08/26/2024 1110   PHURINE 5.0 08/26/2024 1110   GLUCOSEU NEGATIVE 08/26/2024 1110   HGBUR SMALL (A) 08/26/2024 1110   BILIRUBINUR NEGATIVE 08/26/2024 1110   KETONESUR NEGATIVE 08/26/2024 1110   PROTEINUR NEGATIVE 08/26/2024 1110   UROBILINOGEN 0.2 04/28/2012 2308   NITRITE NEGATIVE 08/26/2024 1110   LEUKOCYTESUR MODERATE (A) 08/26/2024 1110   Sepsis Labs Recent Labs  Lab 08/25/24 0115 08/27/24 0424  WBC 11.6* 9.6   Microbiology No results found for this or any previous visit (from the past 240 hours).   Time coordinating discharge: 35 minutes  SIGNED:   Aubrey Blackard, MD  [facility] 08/28/2024, 11:21 AM

## 2024-08-30 ENCOUNTER — Encounter: Payer: Self-pay | Admitting: Adult Health

## 2024-08-30 ENCOUNTER — Non-Acute Institutional Stay (SKILLED_NURSING_FACILITY): Payer: Self-pay | Admitting: Adult Health

## 2024-08-30 DIAGNOSIS — D696 Thrombocytopenia, unspecified: Secondary | ICD-10-CM | POA: Diagnosis not present

## 2024-08-30 DIAGNOSIS — E876 Hypokalemia: Secondary | ICD-10-CM | POA: Diagnosis not present

## 2024-08-30 DIAGNOSIS — I482 Chronic atrial fibrillation, unspecified: Secondary | ICD-10-CM | POA: Diagnosis not present

## 2024-08-30 DIAGNOSIS — I10 Essential (primary) hypertension: Secondary | ICD-10-CM

## 2024-08-30 DIAGNOSIS — J9611 Chronic respiratory failure with hypoxia: Secondary | ICD-10-CM | POA: Diagnosis not present

## 2024-08-30 DIAGNOSIS — R600 Localized edema: Secondary | ICD-10-CM | POA: Diagnosis not present

## 2024-08-30 DIAGNOSIS — M25562 Pain in left knee: Secondary | ICD-10-CM | POA: Diagnosis not present

## 2024-08-30 DIAGNOSIS — L03012 Cellulitis of left finger: Secondary | ICD-10-CM | POA: Diagnosis not present

## 2024-08-30 DIAGNOSIS — I700 Atherosclerosis of aorta: Secondary | ICD-10-CM | POA: Insufficient documentation

## 2024-08-30 NOTE — Progress Notes (Signed)
 Location:  [facility] Room Number: 130 Place of Service:  SNF (31)   CODE STATUS: dnr  No Known Allergies  Chief Complaint  Patient presents with   Hospitalization Follow-up    HPI:  She is a 89 year old woman who has been hospitalized from 08-25-24 through 08-28-24. Her past medical history includes: atrial fibrillation on xarelto ; thrombocytopenia; history of CVA. She presented to the ED after having a fall at home with left leg pain. She had been ambulating with a walker independently. She had gone to the bathroom lost her balance and fell landing on trash can. She had extensive imaging which was negative for fracture, dislocation, or other significant trauma. She has cellulitis on her left 5th finger for which she will need 5 more days of keflex . There was no urine culture performed. She is here for short term rehab with her goal to return back home. She will continue to be followed for her chronic illnesses including: Atrial fibrillation chronic: Essential hypertension:   Chronic respiratory failure with hypoxia   Past Medical History:  Diagnosis Date   Atrial fibrillation [facility])    Essential hypertension    History of stroke 04/29/2012   Recurrent Clostridioides difficile diarrhea    Thrombocytopenia    TIA (transient ischemic attack)     Past Surgical History:  Procedure Laterality Date   No prior surgery      Social History   Socioeconomic History   Marital status: Widowed    Spouse name: Not on file   Number of children: Not on file   Years of education: Not on file   Highest education level: Not on file  Occupational History   Not on file  Tobacco Use   Smoking status: Never   Smokeless tobacco: Never  Vaping Use   Vaping status: Never Used  Substance and Sexual Activity   Alcohol use: No   Drug use: No   Sexual activity: Not on file  Other Topics Concern   Not on file  Social History Narrative   Not on file   Social Drivers  of Health   Financial Resource Strain: Not on file  Food Insecurity: No Food Insecurity (08/26/2024)   Hunger Vital Sign    Worried About Running Out of Food in the Last Year: Never true    Ran Out of Food in the Last Year: Never true  Transportation Needs: No Transportation Needs (08/26/2024)   PRAPARE - Administrator, Civil Service (Medical): No    Lack of Transportation (Non-Medical): No  Physical Activity: Not on file  Stress: Not on file  Social Connections: Not on file  Intimate Partner Violence: Not At Risk (08/26/2024)   Humiliation, Afraid, Rape, and Kick questionnaire    Fear of Current or Ex-Partner: No    Emotionally Abused: No    Physically Abused: No    Sexually Abused: No   Family History  Problem Relation Age of Onset   Stroke Other    Cancer Other    Heart attack Mother    Fibromyalgia Mother    Hypercholesterolemia Mother    Leukemia Sister    Cancer Brother    Stroke Father    Heart attack Brother    Diabetes Son    Fibromyalgia Son    Hypertension Son       VITAL SIGNS BP 126/60   Pulse 79   Temp (!) 97 F (36.1 C)   Resp 20  Ht 5' 2 (1.575 m)   Wt 127 lb (57.6 kg)   SpO2 95%   BMI 23.23 kg/m   Outpatient Encounter Medications as of 08/30/2024  Medication Sig   cyanocobalamin  1000 MCG tablet Take 1,000 mcg by mouth daily.   loratadine  (CLARITIN ) 10 MG tablet Take 10 mg by mouth daily.   ascorbic acid  (VITAMIN C) 500 MG tablet Take 500 mg by mouth daily.   calcium -vitamin D (OSCAL WITH D) 500-200 MG-UNIT tablet Take 1 tablet by mouth daily with breakfast.   cephALEXin  (KEFLEX ) 500 MG capsule Take 1 capsule (500 mg total) by mouth every 6 (six) hours.   furosemide  (LASIX ) 40 MG tablet TAKE 1 TABLET BY MOUTH ONCE A DAY.   guaiFENesin -dextromethorphan  (ROBITUSSIN DM) 100-10 MG/5ML syrup Take 10 mLs by mouth every 4 (four) hours as needed for cough.   metoprolol  succinate (TOPROL -XL) 25 MG 24 hr tablet Take 25 mg by mouth daily.    Multiple Vitamins-Minerals (VITAMIN D3 COMPLETE PO) Take 1 tablet by mouth daily.   oxyCODONE -acetaminophen  (PERCOCET) 5-325 MG tablet Take 1 tablet by mouth every 4 (four) hours as needed for severe pain (pain score 7-10).   potassium chloride  (KLOR-CON ) 10 MEQ tablet TAKE 1 TABLET BY MOUTH ONCE A DAY.   saccharomyces boulardii (FLORASTOR) 250 MG capsule Take 250 mg by mouth 2 (two) times daily.   senna-docusate (SENOKOT-S) 8.6-50 MG tablet Take 1 tablet by mouth at bedtime as needed for mild constipation.   triamcinolone cream (KENALOG) 0.1 % Apply topically 2 (two) times daily.   XARELTO  15 MG TABS tablet Take 15 mg by mouth in the morning.    zinc  sulfate 220 (50 Zn) MG capsule Take 1 capsule (220 mg total) by mouth daily.   [DISCONTINUED] cetirizine (ZYRTEC) 10 MG tablet Take 10 mg by mouth in the morning.    [DISCONTINUED] Cyanocobalamin  (B-12 PO) Take 1 tablet by mouth daily.   No facility-administered encounter medications on file as of 08/30/2024.     SIGNIFICANT DIAGNOSTIC EXAMS  LABS  08-25-24; wbc 11.6; hgb 13.2; hct 40.5; mcv 92.3 plt 117; glucose 128; bun 16; creat 1.03; k+ 3.7; na++ 138; ca 9.7; gfr 48;  08-27-24: wbc 9.6;hgb 13.5; hct 40.8; mcv 91.9 plt 103; glucose 116; bun 15;creat 0.93; k+ 3.9; na++ 137; ca 9.3; gfr 54; total bili 1.7; protein 6.2 albumin 3.9  Review of Systems  Constitutional:  Negative for malaise/fatigue.  Respiratory:  Negative for cough and shortness of breath.   Cardiovascular:  Negative for chest pain, palpitations and leg swelling.  Gastrointestinal:  Negative for abdominal pain, constipation and heartburn.  Musculoskeletal:  Positive for joint pain. Negative for back pain and myalgias.  Skin: Negative.   Neurological:  Negative for dizziness.  Psychiatric/Behavioral:  The patient is not nervous/anxious.     Physical Exam Constitutional:      General: She is not in acute distress.    Appearance: She is well-developed. She is not  diaphoretic.  Neck:     Thyroid : No thyromegaly.  Cardiovascular:     Rate and Rhythm: Normal rate and regular rhythm.     Heart sounds: Normal heart sounds.  Pulmonary:     Effort: Pulmonary effort is normal. No respiratory distress.     Breath sounds: Normal breath sounds.  Abdominal:     General: Bowel sounds are normal. There is no distension.     Palpations: Abdomen is soft.     Tenderness: There is no abdominal tenderness.  Musculoskeletal:  Cervical back: Neck supple.     Right lower leg: No edema.     Left lower leg: No edema.     Comments: Left knee brace in place  Lymphadenopathy:     Cervical: No cervical adenopathy.  Skin:    General: Skin is warm and dry.  Neurological:     Mental Status: She is alert. Mental status is at baseline.  Psychiatric:        Mood and Affect: Mood normal.      ASSESSMENT/ PLAN:  TODAY  Atrial fibrillation chronic: heart rate is stable will continue toprol  xl 25 mg daily for rate control; will continue xarelto  15 mg daily   2. Essential hypertension: b/p 126/60: will continue toprol  xl 25 mg daily  3. Chronic respiratory failure with hypoxia: is on room air is on claritin  10 mg daily   4. Thrombocytopenia: plt 103  5. Acute pain of left knee: is status post fall; has knee splint in place will change to tylenol  cr 650 mg every 6 hours routinely will change percocet to 5/325 mg every 6 hours as needed   6. Hypokalemia: k+ 3.9 will continue k+ 10 meq daily  7. Cellulitis left little finger: will complete keflex  will monitor  8. Bilateral lower extremity edema: will continue lasix  40 mg daily   9. Aortic atherosclerosis (cxr 08-25-24) not on statin due to advanced age      Barnie Seip NP [facility]  call [phone]

## 2024-08-31 ENCOUNTER — Encounter: Payer: Self-pay | Admitting: Internal Medicine

## 2024-08-31 ENCOUNTER — Non-Acute Institutional Stay (SKILLED_NURSING_FACILITY): Payer: Self-pay | Admitting: Internal Medicine

## 2024-08-31 DIAGNOSIS — W19XXXD Unspecified fall, subsequent encounter: Secondary | ICD-10-CM | POA: Diagnosis not present

## 2024-08-31 DIAGNOSIS — D696 Thrombocytopenia, unspecified: Secondary | ICD-10-CM

## 2024-08-31 DIAGNOSIS — F01B0 Vascular dementia, moderate, without behavioral disturbance, psychotic disturbance, mood disturbance, and anxiety: Secondary | ICD-10-CM

## 2024-08-31 DIAGNOSIS — L03012 Cellulitis of left finger: Secondary | ICD-10-CM | POA: Diagnosis not present

## 2024-08-31 DIAGNOSIS — F0150 Vascular dementia without behavioral disturbance: Secondary | ICD-10-CM | POA: Insufficient documentation

## 2024-08-31 DIAGNOSIS — Y92009 Unspecified place in unspecified non-institutional (private) residence as the place of occurrence of the external cause: Secondary | ICD-10-CM

## 2024-08-31 DIAGNOSIS — I482 Chronic atrial fibrillation, unspecified: Secondary | ICD-10-CM

## 2024-08-31 NOTE — Assessment & Plan Note (Signed)
 The CT CNS findings and their significance was discussed with her daughter.  It is anticipated that her neurocognitive status will improve once she is back home in her familiar environment.

## 2024-08-31 NOTE — Assessment & Plan Note (Signed)
 PT/OT at SNF as tolerated.  I discussed avoiding any medications on the [facility] list which would increase her risk of falling with her adult daughter.SABRA

## 2024-08-31 NOTE — Assessment & Plan Note (Signed)
 Current platelet count 103,000 in the context of Xarelto  prophylaxis because of chronic A-fib.  Despite this no bleeding dyscrasias reported.  Continue to monitor.

## 2024-08-31 NOTE — Patient Instructions (Signed)
 See assessment and plan under each diagnosis in the problem list and acutely for this visit

## 2024-08-31 NOTE — Assessment & Plan Note (Signed)
 She was placed on Keflex  prior to discharge with 5 additional days postdischarge.  There is granulomatous changes over the dorsum of the left fifth digit with minimal erythema and.  If the erythema persists; Bactroban topically twice daily will be prescribed.

## 2024-08-31 NOTE — Assessment & Plan Note (Signed)
 Rhythm is irregular but rate is well-controlled.  Xarelto  prophylaxis being continued.  TSH should be updated with the next blood draw once the last value on record was in 2013.

## 2024-08-31 NOTE — Progress Notes (Unsigned)
 NURSING HOME LOCATION:  [facility] ROOM NUMBER:  130 P  CODE STATUS:  DNR  PCP: Norleen General, MD  This is a comprehensive admission note to this SNFperformed on this date less than 30 days from date of admission. Included are preadmission medical/surgical history; reconciled medication list; family history; social history and comprehensive review of systems.  Corrections and additions to the records were documented. Comprehensive physical exam was also performed. Additionally a clinical summary was entered for each active diagnosis pertinent to this admission in the Problem List to enhance continuity of care.  HPI: She was hospitalized 10/15- 08/28/2024  following mechanical fall without apparent nruro or cardiac prodrome.  Extensive imaging revealed no definite fracture or acute change.  However CT of the head revealed atrophy and chronic small vessel ischemic disease changes with evidence of old infarcts in the left frontal, parietal, and occipital lobes. Admission creatinine was 1.03 with eGFR 48; final creatinine 0.93.eGFR was 54, CKD Stage 3a. CBC was WNL. TCP was present with platelet # ranging from 117,000 initially with final # 103,000.Glucoses while IP ranged 116 - 128. Total protein was 6.2 ; albumin 3.9. LFTs were normal , but total bilirubin was 1.7 suggesting Gilbert's syndrome. Last TSH 1.947 & A1c 5.7% in 2013. Past medical and surgical history:includes chronic AF;HTN;TCP; PMH ischemic CVA;  Family history: reviewed, non contributory due to advanced age.  Social history:   Review of systems: Clinical neurocognitive deficits made validity of responses questionable , compromising ROS completion.  She had no comprehension of diagnoses made in the hospital.  She was unable to give me her daughter's date of birth.  She tended to confabulate talking about company was present but she had to go to the bathroom when my head slammed and  I fell off the pot and hit  the pot.  She complained of pain in the left buttocks and left lower extremity.  She stated I hollered. Thought the leg was going to kill me.  At this time she validates that the left leg has come a long way. She also validates intermittent headaches which are severe. Constitutional: No fever, significant weight change, fatigue  Eyes: No redness, discharge, pain, vision change ENT/mouth: No nasal congestion, purulent discharge, earache, change in hearing, sore throat  Cardiovascular: No chest pain, palpitations, paroxysmal nocturnal dyspnea, claudication, edema  Respiratory: No cough, sputum production, hemoptysis, DOE, significant snoring, apnea Gastrointestinal: No heartburn, dysphagia, abdominal pain, nausea /vomiting, rectal bleeding, melena, change in bowels Genitourinary: No dysuria, hematuria, pyuria, incontinence, nocturia Musculoskeletal: No joint stiffness, joint swelling, weakness, pain Dermatologic: No rash, pruritus, change in appearance of skin Neurologic: No dizziness, headache, syncope, seizures, numbness, tingling Psychiatric: No significant anxiety, depression, insomnia, anorexia Endocrine: No change in hair/skin/nails, excessive thirst, excessive hunger, excessive urination  Hematologic/lymphatic: No significant bruising, lymphadenopathy, abnormal bleeding Allergy/immunology: No itchy/watery eyes, significant sneezing, urticaria, angioedema  Physical exam:  Pertinent or positive findings: She appears her age.  Eyebrow density is decreased.  The right lower lid is puffier than the left.  There is fine hirsutism over the chin.  There is decreased height of the thorax and kyphosis.  The thorax appears to almost sit on the hips causing some abdominal protuberance.  Rhythm is irregular but rate adequately controlled.  Pedal pulses are not palpable.  She has trace edema at the ankle.  Interosseous wasting is noted.  She has a soft brace over the left lower extremity.  There is  granulomatous change over the  dorsum of the left fifth digit with minimal erythema around the base. General appearance: Adequately nourished; no acute distress, increased work of breathing is present.   Lymphatic: No lymphadenopathy about the head, neck, axilla. Eyes: No conjunctival inflammation or lid edema is present. There is no scleral icterus. Ears:  External ear exam shows no significant lesions or deformities.   Nose:  External nasal examination shows no deformity or inflammation. Nasal mucosa are pink and moist without lesions, exudates Oral exam: Lips and gums are healthy appearing.There is no oropharyngeal erythema or exudate. Neck:  No thyromegaly, masses, tenderness noted.    Heart:  Normal rate and regular rhythm. S1 and S2 normal without gallop, murmur, click, rub.  Lungs: Chest clear to auscultation without wheezes, rhonchi, rales, rubs. Abdomen: Bowel sounds are normal.  Abdomen is soft and nontender with no organomegaly, hernias, masses. GU: Deferred  Extremities:  No cyanosis, clubbing, edema. Neurologic exam:  Strength equal  in upper & lower extremities. Balance, Rhomberg, finger to nose testing could not be completed due to clinical state Deep tendon reflexes are equal Skin: Warm & dry w/o tenting. No significant lesions or rash.  See clinical summary under each active problem in the Problem List with associated updated therapeutic plan

## 2024-09-15 ENCOUNTER — Encounter: Payer: Self-pay | Admitting: Adult Health

## 2024-09-15 ENCOUNTER — Non-Acute Institutional Stay (SKILLED_NURSING_FACILITY): Payer: Self-pay | Admitting: Adult Health

## 2024-09-15 DIAGNOSIS — W19XXXD Unspecified fall, subsequent encounter: Secondary | ICD-10-CM

## 2024-09-15 DIAGNOSIS — I482 Chronic atrial fibrillation, unspecified: Secondary | ICD-10-CM

## 2024-09-15 DIAGNOSIS — I700 Atherosclerosis of aorta: Secondary | ICD-10-CM

## 2024-09-15 DIAGNOSIS — F01B0 Vascular dementia, moderate, without behavioral disturbance, psychotic disturbance, mood disturbance, and anxiety: Secondary | ICD-10-CM

## 2024-09-15 DIAGNOSIS — Y92009 Unspecified place in unspecified non-institutional (private) residence as the place of occurrence of the external cause: Secondary | ICD-10-CM | POA: Diagnosis not present

## 2024-09-15 NOTE — Progress Notes (Signed)
 Location:  [facility] Room Number: 130 P Place of Service:  SNF (31)   CODE STATUS: DNR   No Known Allergies  Chief Complaint  Patient presents with   Discharge Note    HPI:  She is being discharged to [facility]. She will need home health for pt/ot; will not need any dme; will need her medications ordered and will need to follow up with her health care provider. She was hospitalized for fall at home without fracture; and cellulitis in her 5th finger left hand. She was admitted to this facility for short term rehab. Therapy: ambulate 60 feet with rolling walker and supervision. Her upper body care is contact guard and lower body max assist. Brp: mod assist.   Past Medical History:  Diagnosis Date   Atrial fibrillation (HCC)    Essential hypertension    History of stroke 04/29/2012   Recurrent Clostridioides difficile diarrhea    Thrombocytopenia    TIA (transient ischemic attack)     Past Surgical History:  Procedure Laterality Date   No prior surgery      Social History   Socioeconomic History   Marital status: Widowed    Spouse name: Not on file   Number of children: Not on file   Years of education: Not on file   Highest education level: Not on file  Occupational History   Not on file  Tobacco Use   Smoking status: Never   Smokeless tobacco: Never  Vaping Use   Vaping status: Never Used  Substance and Sexual Activity   Alcohol use: No   Drug use: No   Sexual activity: Not on file  Other Topics Concern   Not on file  Social History Narrative   Not on file   Social Drivers of Health   Financial Resource Strain: Not on file  Food Insecurity: No Food Insecurity (08/26/2024)   Hunger Vital Sign    Worried About Running Out of Food in the Last Year: Never true    Ran Out of Food in the Last Year: Never true  Transportation Needs: No Transportation Needs (08/26/2024)   PRAPARE - Administrator, Civil Service (Medical): No     Lack of Transportation (Non-Medical): No  Physical Activity: Not on file  Stress: Not on file  Social Connections: Not on file  Intimate Partner Violence: Not At Risk (08/26/2024)   Humiliation, Afraid, Rape, and Kick questionnaire    Fear of Current or Ex-Partner: No    Emotionally Abused: No    Physically Abused: No    Sexually Abused: No   Family History  Problem Relation Age of Onset   Stroke Other    Cancer Other    Heart attack Mother    Fibromyalgia Mother    Hypercholesterolemia Mother    Leukemia Sister    Cancer Brother    Stroke Father    Heart attack Brother    Diabetes Son    Fibromyalgia Son    Hypertension Son       VITAL SIGNS BP 119/65   Pulse 60   Temp 97.8 F (36.6 C)   Resp 18   Ht 5' 2 (1.575 m)   Wt 128 lb 12.8 oz (58.4 kg)   SpO2 97%   BMI 23.56 kg/m   Outpatient Encounter Medications as of 09/15/2024  Medication Sig   acetaminophen  (TYLENOL ) 650 MG CR tablet Take 650 mg by mouth every 6 (six) hours.   ascorbic acid  (  VITAMIN C) 500 MG tablet Take 500 mg by mouth daily.   calcium -vitamin D (OSCAL WITH D) 500-200 MG-UNIT tablet Take 1 tablet by mouth daily with breakfast.   cyanocobalamin  1000 MCG tablet Take 1,000 mcg by mouth daily.   furosemide  (LASIX ) 40 MG tablet TAKE 1 TABLET BY MOUTH ONCE A DAY.   guaiFENesin -dextromethorphan  (ROBITUSSIN DM) 100-10 MG/5ML syrup Take 10 mLs by mouth every 4 (four) hours as needed for cough.   loratadine  (CLARITIN ) 10 MG tablet Take 10 mg by mouth daily.   metoprolol  succinate (TOPROL -XL) 25 MG 24 hr tablet Take 25 mg by mouth daily.   Multiple Vitamins-Minerals (VITAMIN D3 COMPLETE PO) Take 1 tablet by mouth daily.   potassium chloride  (KLOR-CON ) 10 MEQ tablet TAKE 1 TABLET BY MOUTH ONCE A DAY.   saccharomyces boulardii (FLORASTOR) 250 MG capsule Take 250 mg by mouth 2 (two) times daily.   senna-docusate (SENOKOT-S) 8.6-50 MG tablet Take 1 tablet by mouth at bedtime as needed for mild constipation.    triamcinolone cream (KENALOG) 0.1 % Apply topically 2 (two) times daily.   XARELTO  15 MG TABS tablet Take 15 mg by mouth in the morning.    zinc  sulfate 220 (50 Zn) MG capsule Take 1 capsule (220 mg total) by mouth daily.   cephALEXin  (KEFLEX ) 500 MG capsule Take 1 capsule (500 mg total) by mouth every 6 (six) hours. (Patient not taking: Reported on 09/15/2024)   oxyCODONE -acetaminophen  (PERCOCET) 5-325 MG tablet Take 1 tablet by mouth every 4 (four) hours as needed for severe pain (pain score 7-10). (Patient not taking: Reported on 09/15/2024)   No facility-administered encounter medications on file as of 09/15/2024.     SIGNIFICANT DIAGNOSTIC EXAMS  LABS  08-25-24; wbc 11.6; hgb 13.2; hct 40.5; mcv 92.3 plt 117; glucose 128; bun 16; creat 1.03; k+ 3.7; na++ 138; ca 9.7; gfr 48;  08-27-24: wbc 9.6;hgb 13.5; hct 40.8; mcv 91.9 plt 103; glucose 116; bun 15;creat 0.93; k+ 3.9; na++ 137; ca 9.3; gfr 54; total bili 1.7; protein 6.2 albumin 3.9  Review of Systems  Constitutional:  Negative for malaise/fatigue.  Respiratory:  Negative for cough and shortness of breath.   Cardiovascular:  Negative for chest pain, palpitations and leg swelling.  Gastrointestinal:  Negative for abdominal pain, constipation and heartburn.  Musculoskeletal:  Negative for back pain, joint pain and myalgias.  Skin: Negative.   Neurological:  Negative for dizziness.  Psychiatric/Behavioral:  The patient is not nervous/anxious.     Physical Exam Constitutional:      General: She is not in acute distress.    Appearance: She is well-developed. She is not diaphoretic.  Neck:     Thyroid : No thyromegaly.  Cardiovascular:     Rate and Rhythm: Normal rate and regular rhythm.     Heart sounds: Normal heart sounds.  Pulmonary:     Effort: Pulmonary effort is normal. No respiratory distress.     Breath sounds: Normal breath sounds.  Abdominal:     General: Bowel sounds are normal. There is no distension.      Palpations: Abdomen is soft.     Tenderness: There is no abdominal tenderness.  Musculoskeletal:        General: Normal range of motion.     Cervical back: Neck supple.     Right lower leg: Edema present.     Left lower leg: Edema present.  Lymphadenopathy:     Cervical: No cervical adenopathy.  Skin:    General: Skin is  warm and dry.  Neurological:     Mental Status: She is alert. Mental status is at baseline.  Psychiatric:        Mood and Affect: Mood normal.       ASSESSMENT/ PLAN:   Patient is being discharged with the following home health services:  pt/ot: to evaluate and treat as indicated for gait balance strength adl training.   Patient is being discharged with the following durable medical equipment:  none needed   Patient has been advised to f/u with their PCP in 1-2 weeks to for a transitions of care visit.  Social services at their facility was responsible for arranging this appointment.  Pt was provided with adequate prescriptions of noncontrolled medications to reach the scheduled appointment .  For controlled substances, a limited supply was provided as appropriate for the individual patient.  If the pt normally receives these medications from a pain clinic or has a contract with another physician, these medications should be received from that clinic or physician only).    A 30 day supply of her prescription medications have been sent to [facility]  Time spent with patient 40 minutes: medications; home health; dme.   Barnie Seip NP [facility]  call [phone]

## 2024-09-20 ENCOUNTER — Telehealth: Payer: Self-pay | Admitting: Cardiology

## 2024-09-20 NOTE — Telephone Encounter (Signed)
*  STAT* If patient is at the pharmacy, call can be transferred to refill team.   1. Which medications need to be refilled? (please list name of each medication and dose if known)   metoprolol  succinate (TOPROL -XL) 25 MG 24 hr tablet    2. Which pharmacy/location (including street and city if local pharmacy) is medication to be sent to?  [facility] - [place], [place] - [address]      3. Do they need a 30 day or 90 day supply? 90 day

## 2024-09-21 MED ORDER — METOPROLOL SUCCINATE ER 25 MG PO TB24: 25.0000 mg | ORAL_TABLET | Freq: Every day | ORAL | 1 refills | Status: AC

## 2024-09-21 NOTE — Telephone Encounter (Signed)
 Refill sent.

## 2024-09-22 DIAGNOSIS — Z9181 History of falling: Secondary | ICD-10-CM | POA: Diagnosis not present

## 2024-09-22 DIAGNOSIS — Z7901 Long term (current) use of anticoagulants: Secondary | ICD-10-CM | POA: Diagnosis not present

## 2024-09-22 DIAGNOSIS — Z8619 Personal history of other infectious and parasitic diseases: Secondary | ICD-10-CM | POA: Diagnosis not present

## 2024-09-22 DIAGNOSIS — Z9981 Dependence on supplemental oxygen: Secondary | ICD-10-CM | POA: Diagnosis not present

## 2024-09-22 DIAGNOSIS — M6281 Muscle weakness (generalized): Secondary | ICD-10-CM | POA: Diagnosis not present

## 2024-09-22 DIAGNOSIS — I69398 Other sequelae of cerebral infarction: Secondary | ICD-10-CM | POA: Diagnosis not present

## 2024-09-22 DIAGNOSIS — I10 Essential (primary) hypertension: Secondary | ICD-10-CM | POA: Diagnosis not present

## 2024-09-22 DIAGNOSIS — J9611 Chronic respiratory failure with hypoxia: Secondary | ICD-10-CM | POA: Diagnosis not present

## 2024-09-22 DIAGNOSIS — E876 Hypokalemia: Secondary | ICD-10-CM | POA: Diagnosis not present

## 2024-09-22 DIAGNOSIS — I700 Atherosclerosis of aorta: Secondary | ICD-10-CM | POA: Diagnosis not present

## 2024-09-22 DIAGNOSIS — F01B0 Vascular dementia, moderate, without behavioral disturbance, psychotic disturbance, mood disturbance, and anxiety: Secondary | ICD-10-CM | POA: Diagnosis not present

## 2024-09-22 DIAGNOSIS — L03012 Cellulitis of left finger: Secondary | ICD-10-CM | POA: Diagnosis not present

## 2024-09-22 DIAGNOSIS — D696 Thrombocytopenia, unspecified: Secondary | ICD-10-CM | POA: Diagnosis not present

## 2024-09-22 DIAGNOSIS — I482 Chronic atrial fibrillation, unspecified: Secondary | ICD-10-CM | POA: Diagnosis not present

## 2024-09-25 ENCOUNTER — Ambulatory Visit
Admission: RE | Admit: 2024-09-25 | Discharge: 2024-09-25 | Disposition: A | Discharge status: Home / Self Care | Source: Ambulatory Visit | Attending: Family Medicine | Admitting: Family Medicine

## 2024-09-25 ENCOUNTER — Ambulatory Visit: Payer: Self-pay

## 2024-09-25 VITALS — BP 130/84 | HR 45 | Temp 98.4°F | Resp 16

## 2024-09-25 DIAGNOSIS — M79645 Pain in left finger(s): Secondary | ICD-10-CM

## 2024-09-25 DIAGNOSIS — L989 Disorder of the skin and subcutaneous tissue, unspecified: Secondary | ICD-10-CM | POA: Diagnosis not present

## 2024-09-25 MED ORDER — CHLORHEXIDINE GLUCONATE 4 % EX SOLN: Freq: Every day | CUTANEOUS | 0 refills | Status: AC | PRN

## 2024-09-25 MED ORDER — CEPHALEXIN 250 MG/5ML PO SUSR: 500.0000 mg | Freq: Two times a day (BID) | ORAL | 0 refills | Status: AC

## 2024-09-25 NOTE — ED Triage Notes (Signed)
 Per daughter pt has pain redness, and discomfort in the left pinky x 2 weeks, was given triamcinolone cream in the hospital.

## 2024-09-25 NOTE — Discharge Instructions (Signed)
 Take the course of antibiotics and clean the area at least once a day with the Hibiclens and keep it covered to protect it.  As we discussed, I have a low suspicion for an actual bacterial cause of symptoms.  The lesion should be further evaluated by primary care or dermatology if not resolving which I have a low suspicion for resolution with just the antibiotics.

## 2024-09-29 NOTE — ED Provider Notes (Signed)
 [facility]    CSN: [id number] Arrival date & time: 09/25/24  1339      History   Chief Complaint Chief Complaint  Patient presents with   Laceration    Infection along finger - Entered by patient    HPI Kathryn Marquez is a 89 y.o. female.   Patient presenting today with 2 week history of a lesion to the left pinky that she states continues to be painful, swollen, and now red. Denies injury to area, numbness, tingling, decreased ROM. Was recently in hospital for a fall and given triamcinolone cream for the lesion with no relief.     Past Medical History:  Diagnosis Date   Atrial fibrillation [facility])    Essential hypertension    History of stroke 04/29/2012   Recurrent Clostridioides difficile diarrhea    Thrombocytopenia    TIA (transient ischemic attack)     Patient Active Problem List   Diagnosis Date Noted   Vascular dementia (HCC) 08/31/2024   Bilateral lower extremity edema 08/30/2024   Aortic atherosclerosis 08/30/2024   Fall at home, subsequent encounter 08/26/2024   Left knee pain 08/26/2024   Cellulitis of left little finger 08/26/2024   Pressure injury of skin 05/30/2021   Prolonged QT interval 05/26/2021   Thrombocytopenia 05/26/2021   Chronic respiratory failure with hypoxia (HCC) 05/26/2021   Recurrent Clostridioides difficile diarrhea 06/11/2020   Colitis 05/08/2020   C. difficile colitis 04/15/2020   Hypokalemia 04/14/2020   Acute diverticulitis 04/13/2020   Dyslipidemia, goal LDL below 100 04/30/2012   TIA (transient ischemic attack) 04/29/2012   Essential hypertension 04/29/2012   Atrial fibrillation, chronic (HCC) 04/29/2012    Past Surgical History:  Procedure Laterality Date   No prior surgery      OB History   No obstetric history on file.      Home Medications    Prior to Admission medications   Medication Sig Start Date End Date Taking? Authorizing Provider  cephALEXin  (KEFLEX ) 250 MG/5ML suspension Take 10 mLs  (500 mg total) by mouth 2 (two) times daily for 7 days. 09/25/24 10/02/24 Yes Stuart Vernell Norris, PA-C  chlorhexidine (HIBICLENS) 4 % external liquid Apply topically daily as needed. 09/25/24  Yes Stuart Vernell Norris, PA-C  acetaminophen  (TYLENOL ) 650 MG CR tablet Take 650 mg by mouth every 6 (six) hours.    [provider]  ascorbic acid  (VITAMIN C) 500 MG tablet Take 500 mg by mouth daily.    [provider]  calcium -vitamin D (OSCAL WITH D) 500-200 MG-UNIT tablet Take 1 tablet by mouth daily with breakfast.    [provider]  cyanocobalamin  1000 MCG tablet Take 1,000 mcg by mouth daily.    [provider]  furosemide  (LASIX ) 40 MG tablet TAKE 1 TABLET BY MOUTH ONCE A DAY. 01/26/24   Debera Jayson MATSU, MD  guaiFENesin -dextromethorphan  (ROBITUSSIN DM) 100-10 MG/5ML syrup Take 10 mLs by mouth every 4 (four) hours as needed for cough. 05/30/21   Johnson, Clanford L, MD  loratadine  (CLARITIN ) 10 MG tablet Take 10 mg by mouth daily.    [provider]  metoprolol  succinate (TOPROL -XL) 25 MG 24 hr tablet Take 1 tablet (25 mg total) by mouth daily. 09/21/24   Debera Jayson MATSU, MD  Multiple Vitamins-Minerals (VITAMIN D3 COMPLETE PO) Take 1 tablet by mouth daily.    [provider]  potassium chloride  (KLOR-CON ) 10 MEQ tablet TAKE 1 TABLET BY MOUTH ONCE A DAY. 01/26/24   Debera Jayson MATSU, MD  saccharomyces boulardii (FLORASTOR) 250 MG capsule Take 250 mg by mouth 2 (two) times daily.    [provider]  senna-docusate (SENOKOT-S) 8.6-50 MG tablet Take 1 tablet by mouth at bedtime as needed for mild constipation. 08/28/24   Sigdel, Santosh, MD  triamcinolone cream (KENALOG) 0.1 % Apply topically 2 (two) times daily. 02/26/22   [provider]  XARELTO  15 MG TABS tablet Take 15 mg by mouth in the morning.  12/14/19   [provider]  zinc  sulfate 220 (50 Zn) MG capsule Take 1 capsule (220 mg total) by mouth daily. 05/31/21    Vicci Afton CROME, MD    Family History Family History  Problem Relation Age of Onset   Stroke Other    Cancer Other    Heart attack Mother    Fibromyalgia Mother    Hypercholesterolemia Mother    Leukemia Sister    Cancer Brother    Stroke Father    Heart attack Brother    Diabetes Son    Fibromyalgia Son    Hypertension Son     Social History Social History   Tobacco Use   Smoking status: Never   Smokeless tobacco: Never  Vaping Use   Vaping status: Never Used  Substance Use Topics   Alcohol use: No   Drug use: No     Allergies   Patient has no allergy information on record.   Review of Systems Review of Systems PER HPI  Physical Exam Triage Vital Signs ED Triage Vitals  Encounter Vitals Group     BP 09/25/24 1349 130/84     Girls Systolic BP Percentile --      Girls Diastolic BP Percentile --      Boys Systolic BP Percentile --      Boys Diastolic BP Percentile --      Pulse Rate 09/25/24 1349 (!) 45     Resp 09/25/24 1349 16     Temp 09/25/24 1349 98.4 F (36.9 C)     Temp Source 09/25/24 1349 Oral     SpO2 09/25/24 1349 94 %     Weight --      Height --      Head Circumference --      Peak Flow --      Pain Score 09/25/24 1353 0     Pain Loc --      Pain Education --      Exclude from Growth Chart --    No data found.  Updated Vital Signs BP 130/84 (BP Location: Right Arm)   Pulse (!) 45   Temp 98.4 F (36.9 C) (Oral)   Resp 16   SpO2 94%   Visual Acuity Right Eye Distance:   Left Eye Distance:   Bilateral Distance:    Right Eye Near:   Left Eye Near:    Bilateral Near:     Physical Exam Vitals and nursing note reviewed.  Constitutional:      Appearance: Normal appearance. She is not ill-appearing.  HENT:     Head: Atraumatic.  Eyes:     Extraocular Movements: Extraocular movements intact.     Conjunctiva/sclera: Conjunctivae normal.  Cardiovascular:     Rate and Rhythm: Normal rate.  Pulmonary:     Effort:  Pulmonary effort is normal.  Musculoskeletal:        General: Normal range of motion.     Cervical back: Normal range of motion and neck supple.  Skin:    General:  Skin is warm and dry.     Comments: Left pinky with hard lesion consistent with cutaneous horn, significantly ttp. No bleeding, drainage, fluctuance to the area. Dried red sticky fluid dried onto finger which family later states is liquid medication  Neurological:     Mental Status: She is alert and oriented to person, place, and time.     Comments: LUE neurovascularly intact  Psychiatric:        Mood and Affect: Mood normal.        Thought Content: Thought content normal.        Judgment: Judgment normal.      UC Treatments / Results  Labs (all labs ordered are listed, but only abnormal results are displayed) Labs Reviewed - No data to display  EKG   Radiology No results found.  Procedures Procedures (including critical care time)  Medications Ordered in UC Medications - No data to display  Initial Impression / Assessment and Plan / UC Course  I have reviewed the triage vital signs and the nursing notes.  Pertinent labs & imaging results that were available during my care of the patient were reviewed by me and considered in my medical decision making (see chart for details).     Vitals reassuring, she is well appearing and in no acute distress. Given duration and unresolving course as well as appearance concerning for possible skin malignancy. Patient concerned about infection so will cover with abx for a trial but discussed should f/u with PCP or dermatology for biopsy if not resolving.   Final Clinical Impressions(s) / UC Diagnoses   Final diagnoses:  Skin lesion  Finger pain, left     Discharge Instructions      Take the course of antibiotics and clean the area at least once a day with the Hibiclens and keep it covered to protect it.  As we discussed, I have a low suspicion for an actual bacterial  cause of symptoms.  The lesion should be further evaluated by primary care or dermatology if not resolving which I have a low suspicion for resolution with just the antibiotics.     ED Prescriptions     Medication Sig Dispense Auth. Provider   cephALEXin  (KEFLEX ) 250 MG/5ML suspension Take 10 mLs (500 mg total) by mouth 2 (two) times daily for 7 days. 140 mL Stuart Vernell Norris, PA-C   chlorhexidine (HIBICLENS) 4 % external liquid Apply topically daily as needed. 236 mL Stuart Vernell Norris, [place]      PDMP not reviewed this encounter.   Stuart Vernell [place], PA-C 09/29/24 [phone]

## 2024-09-30 DIAGNOSIS — I10 Essential (primary) hypertension: Secondary | ICD-10-CM | POA: Diagnosis not present

## 2024-09-30 DIAGNOSIS — E538 Deficiency of other specified B group vitamins: Secondary | ICD-10-CM | POA: Diagnosis not present

## 2024-09-30 DIAGNOSIS — E785 Hyperlipidemia, unspecified: Secondary | ICD-10-CM | POA: Diagnosis not present

## 2024-09-30 DIAGNOSIS — I480 Paroxysmal atrial fibrillation: Secondary | ICD-10-CM | POA: Diagnosis not present

## 2024-09-30 DIAGNOSIS — I5042 Chronic combined systolic (congestive) and diastolic (congestive) heart failure: Secondary | ICD-10-CM | POA: Diagnosis not present

## 2024-09-30 DIAGNOSIS — M1990 Unspecified osteoarthritis, unspecified site: Secondary | ICD-10-CM | POA: Diagnosis not present

## 2024-10-14 ENCOUNTER — Ambulatory Visit
Admission: EM | Admit: 2024-10-14 | Discharge: 2024-10-14 | Disposition: A | Discharge status: Home / Self Care | Attending: Family Medicine | Admitting: Family Medicine

## 2024-10-14 DIAGNOSIS — L989 Disorder of the skin and subcutaneous tissue, unspecified: Secondary | ICD-10-CM | POA: Diagnosis not present

## 2024-10-14 DIAGNOSIS — N390 Urinary tract infection, site not specified: Secondary | ICD-10-CM | POA: Insufficient documentation

## 2024-10-14 DIAGNOSIS — H9193 Unspecified hearing loss, bilateral: Secondary | ICD-10-CM | POA: Diagnosis not present

## 2024-10-14 DIAGNOSIS — R4182 Altered mental status, unspecified: Secondary | ICD-10-CM | POA: Insufficient documentation

## 2024-10-14 LAB — POCT URINE DIPSTICK
Bilirubin, UA: NEGATIVE
Glucose, UA: NEGATIVE mg/dL
Nitrite, UA: NEGATIVE
Protein Ur, POC: 100 mg/dL — AB
Spec Grav, UA: >=1.03 — AB (ref 1.010–1.025)
Urobilinogen, UA: 1 U/dL
pH, UA: 5 (ref 5.0–8.0)

## 2024-10-14 MED ORDER — CEPHALEXIN 500 MG PO CAPS: 500.0000 mg | ORAL_CAPSULE | Freq: Two times a day (BID) | ORAL | 0 refills | Status: AC

## 2024-10-14 NOTE — ED Provider Notes (Signed)
 [facility]    CSN: [id number] Arrival date & time: 10/14/24  1235      History   Chief Complaint No chief complaint on file.   HPI Kathryn Marquez is a 89 y.o. female.   Patient presenting today with daughter for evaluation of altered mental status for the past 4 days.  Daughter states she is having confusion beyond her baseline, hallucinating at nighttime and talking to people who are not there.  She denies notice of fever, nausea, vomiting, diarrhea, decreased p.o. intake, urinary changes when changing her depends.  History of urinary tract infections and notes she acts like this when she has 1.  So far not trying anything over-the-counter for symptoms.  Daughter is also requesting her ears to be checked for wax as she seems to be having more trouble hearing than usual.  She is also requesting a recheck on the skin lesion to the left pinky finger that she was seen for several weeks ago.  No change in condition despite course of antibiotics and using triamcinolone cream that was previously prescribed for it.  They have not followed up for biopsy at this time as recommended.    Past Medical History:  Diagnosis Date   Atrial fibrillation [facility])    Essential hypertension    History of stroke 04/29/2012   Recurrent Clostridioides difficile diarrhea    Thrombocytopenia    TIA (transient ischemic attack)     Patient Active Problem List   Diagnosis Date Noted   Vascular dementia (HCC) 08/31/2024   Bilateral lower extremity edema 08/30/2024   Aortic atherosclerosis 08/30/2024   Fall at home, subsequent encounter 08/26/2024   Left knee pain 08/26/2024   Cellulitis of left little finger 08/26/2024   Pressure injury of skin 05/30/2021   Prolonged QT interval 05/26/2021   Thrombocytopenia 05/26/2021   Chronic respiratory failure with hypoxia (HCC) 05/26/2021   Recurrent Clostridioides difficile diarrhea 06/11/2020   Colitis 05/08/2020   C. difficile colitis 04/15/2020    Hypokalemia 04/14/2020   Acute diverticulitis 04/13/2020   Dyslipidemia, goal LDL below 100 04/30/2012   TIA (transient ischemic attack) 04/29/2012   Essential hypertension 04/29/2012   Atrial fibrillation, chronic (HCC) 04/29/2012    Past Surgical History:  Procedure Laterality Date   No prior surgery      OB History   No obstetric history on file.      Home Medications    Prior to Admission medications   Medication Sig Start Date End Date Taking? Authorizing Provider  cephALEXin  (KEFLEX ) 500 MG capsule Take 1 capsule (500 mg total) by mouth 2 (two) times daily. 10/14/24  Yes Stuart Vernell Norris, PA-C  acetaminophen  (TYLENOL ) 650 MG CR tablet Take 650 mg by mouth every 6 (six) hours.    [provider]  ascorbic acid  (VITAMIN C) 500 MG tablet Take 500 mg by mouth daily.    [provider]  calcium -vitamin D (OSCAL WITH D) 500-200 MG-UNIT tablet Take 1 tablet by mouth daily with breakfast.    [provider]  chlorhexidine  (HIBICLENS ) 4 % external liquid Apply topically daily as needed. 09/25/24   Stuart Vernell Norris, PA-C  cyanocobalamin  1000 MCG tablet Take 1,000 mcg by mouth daily.    [provider]  furosemide  (LASIX ) 40 MG tablet TAKE 1 TABLET BY MOUTH ONCE A DAY. 01/26/24   Debera Jayson MATSU, MD  guaiFENesin -dextromethorphan  (ROBITUSSIN DM) 100-10 MG/5ML syrup Take 10 mLs by mouth every 4 (four) hours as needed for cough. 05/30/21  Johnson, Clanford L, MD  loratadine  (CLARITIN ) 10 MG tablet Take 10 mg by mouth daily.    [provider]  metoprolol  succinate (TOPROL -XL) 25 MG 24 hr tablet Take 1 tablet (25 mg total) by mouth daily. 09/21/24   Debera Jayson MATSU, MD  Multiple Vitamins-Minerals (VITAMIN D3 COMPLETE PO) Take 1 tablet by mouth daily.    [provider]  potassium chloride  (KLOR-CON ) 10 MEQ tablet TAKE 1 TABLET BY MOUTH ONCE A DAY. 01/26/24   Debera Jayson MATSU, MD  saccharomyces boulardii (FLORASTOR) 250  MG capsule Take 250 mg by mouth 2 (two) times daily.    [provider]  senna-docusate (SENOKOT-S) 8.6-50 MG tablet Take 1 tablet by mouth at bedtime as needed for mild constipation. 08/28/24   Sigdel, Santosh, MD  triamcinolone cream (KENALOG) 0.1 % Apply topically 2 (two) times daily. 02/26/22   [provider]  XARELTO  15 MG TABS tablet Take 15 mg by mouth in the morning.  12/14/19   [provider]  zinc  sulfate 220 (50 Zn) MG capsule Take 1 capsule (220 mg total) by mouth daily. 05/31/21   Vicci Afton CROME, MD    Family History Family History  Problem Relation Age of Onset   Stroke Other    Cancer Other    Heart attack Mother    Fibromyalgia Mother    Hypercholesterolemia Mother    Leukemia Sister    Cancer Brother    Stroke Father    Heart attack Brother    Diabetes Son    Fibromyalgia Son    Hypertension Son     Social History Social History   Tobacco Use   Smoking status: Never   Smokeless tobacco: Never  Vaping Use   Vaping status: Never Used  Substance Use Topics   Alcohol use: No   Drug use: No     Allergies   Patient has no known allergies.   Review of Systems Review of Systems PER HPI  Physical Exam Triage Vital Signs ED Triage Vitals  Encounter Vitals Group     BP 10/14/24 1252 (!) 113/59     Girls Systolic BP Percentile --      Girls Diastolic BP Percentile --      Boys Systolic BP Percentile --      Boys Diastolic BP Percentile --      Pulse Rate 10/14/24 1252 (!) 51     Resp 10/14/24 1252 16     Temp 10/14/24 1252 (!) 97.4 F (36.3 C)     Temp Source 10/14/24 1252 Oral     SpO2 10/14/24 1252 100 %     Weight --      Height --      Head Circumference --      Peak Flow --      Pain Score 10/14/24 1301 0     Pain Loc --      Pain Education --      Exclude from Growth Chart --    No data found.  Updated Vital Signs BP (!) 113/59 (BP Location: Right Arm)   Pulse (!) 51   Temp (!) 97.4 F (36.3 C) (Oral)    Resp 16   SpO2 100%   Visual Acuity Right Eye Distance:   Left Eye Distance:   Bilateral Distance:    Right Eye Near:   Left Eye Near:    Bilateral Near:     Physical Exam Vitals and nursing note reviewed.  Constitutional:  Appearance: Normal appearance. She is not ill-appearing.  HENT:     Head: Atraumatic.     Right Ear: Ear canal and external ear normal.     Left Ear: Ear canal and external ear normal.  Eyes:     Extraocular Movements: Extraocular movements intact.     Conjunctiva/sclera: Conjunctivae normal.  Cardiovascular:     Rate and Rhythm: Normal rate.  Pulmonary:     Effort: Pulmonary effort is normal.  Musculoskeletal:     Cervical back: Normal range of motion and neck supple.     Comments: In wheelchair, at her baseline  Skin:    General: Skin is warm and dry.     Comments: Large cutaneous horn to the dorsal left pinky finger as pictured below  Neurological:     Mental Status: She is alert. Mental status is at baseline.     Comments: Left upper extremity neurovascularly intact  Psychiatric:        Mood and Affect: Mood normal.        Thought Content: Thought content normal.        Judgment: Judgment normal.      UC Treatments / Results  Labs (all labs ordered are listed, but only abnormal results are displayed) Labs Reviewed  POCT URINE DIPSTICK - Abnormal; Notable for the following components:      Result Value   Clarity, UA turbid (*)    Ketones, POC UA trace (5) (*)    Spec Grav, UA >=1.030 (*)    Blood, UA trace-intact (*)    Protein Ur, POC =100 (*)    Leukocytes, UA Trace (*)    All other components within normal limits  URINE CULTURE    EKG   Radiology No results found.  Procedures Procedures (including critical care time)  Medications Ordered in UC Medications - No data to display  Initial Impression / Assessment and Plan / UC Course  I have reviewed the triage vital signs and the nursing notes.  Pertinent labs &  imaging results that were available during my care of the patient were reviewed by me and considered in my medical decision making (see chart for details).     Trace leukocytes on urinalysis today, urine culture pending for further evaluation but will cover for UTI given symptoms and history while waiting.  Adjust as needed based on this.  Continue good hydration, frequent changing of depends.  Again reiterated importance of shave biopsy for the skin lesion on the finger as it does not appear consistent with a skin cancer.  No appreciation of wax impaction to EACs bilaterally, continue to monitor hearing  Final Clinical Impressions(s) / UC Diagnoses   Final diagnoses:  Altered mental status, unspecified altered mental status type  Acute lower UTI  Subjective hearing change of both ears  Skin lesion   Discharge Instructions   None    ED Prescriptions     Medication Sig Dispense Auth. Provider   cephALEXin  (KEFLEX ) 500 MG capsule Take 1 capsule (500 mg total) by mouth 2 (two) times daily. 10 capsule Stuart Vernell Norris, [place]      PDMP not reviewed this encounter.   Stuart Vernell Norris, PA-C 10/14/24 1357

## 2024-10-14 NOTE — ED Triage Notes (Signed)
 Per daughter, pt has been talking to people and seeing them at night, has confusion more than normal is concerned for UTI. States this happened the last time she had one.

## 2024-10-16 LAB — URINE CULTURE

## 2024-10-18 ENCOUNTER — Ambulatory Visit (HOSPITAL_COMMUNITY): Payer: Self-pay
